# Patient Record
Sex: Male | Born: 1970 | Race: White | Hispanic: No | Marital: Married | Smoking: Former smoker
Health system: Southern US, Community
[De-identification: ages and names within clinical notes are randomized; demographics above are authoritative.]

## PROBLEM LIST (undated history)

## (undated) DIAGNOSIS — K449 Diaphragmatic hernia without obstruction or gangrene: Secondary | ICD-10-CM

## (undated) DIAGNOSIS — I493 Ventricular premature depolarization: Secondary | ICD-10-CM

## (undated) DIAGNOSIS — N200 Calculus of kidney: Secondary | ICD-10-CM

## (undated) DIAGNOSIS — B019 Varicella without complication: Secondary | ICD-10-CM

## (undated) DIAGNOSIS — S62109A Fracture of unspecified carpal bone, unspecified wrist, initial encounter for closed fracture: Secondary | ICD-10-CM

## (undated) DIAGNOSIS — K219 Gastro-esophageal reflux disease without esophagitis: Secondary | ICD-10-CM

## (undated) DIAGNOSIS — U071 COVID-19: Secondary | ICD-10-CM

## (undated) DIAGNOSIS — R001 Bradycardia, unspecified: Secondary | ICD-10-CM

## (undated) DIAGNOSIS — E785 Hyperlipidemia, unspecified: Secondary | ICD-10-CM

## (undated) DIAGNOSIS — I491 Atrial premature depolarization: Secondary | ICD-10-CM

## (undated) DIAGNOSIS — E559 Vitamin D deficiency, unspecified: Secondary | ICD-10-CM

## (undated) HISTORY — DX: Varicella without complication: B01.9

## (undated) HISTORY — DX: Calculus of kidney: N20.0

## (undated) HISTORY — PX: LASIK: SHX215

## (undated) HISTORY — DX: Atrial premature depolarization: I49.1

## (undated) HISTORY — DX: Bradycardia, unspecified: R00.1

## (undated) HISTORY — DX: COVID-19: U07.1

## (undated) HISTORY — DX: Diaphragmatic hernia without obstruction or gangrene: K44.9

## (undated) HISTORY — DX: Hyperlipidemia, unspecified: E78.5

## (undated) HISTORY — DX: Gastro-esophageal reflux disease without esophagitis: K21.9

## (undated) HISTORY — DX: Fracture of unspecified carpal bone, unspecified wrist, initial encounter for closed fracture: S62.109A

## (undated) HISTORY — DX: Ventricular premature depolarization: I49.3

## (undated) HISTORY — DX: Vitamin D deficiency, unspecified: E55.9

---
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
## 2011-10-07 ENCOUNTER — Emergency Department: Payer: Self-pay | Admitting: Emergency Medicine

## 2011-10-07 LAB — BASIC METABOLIC PANEL
Calcium, Total: 8.8 mg/dL (ref 8.5–10.1)
Co2: 25 mmol/L (ref 21–32)
Creatinine: 1.17 mg/dL (ref 0.60–1.30)
EGFR (African American): >60
Glucose: 124 mg/dL — ABNORMAL HIGH (ref 65–99)
Osmolality: 286 (ref 275–301)
Sodium: 143 mmol/L (ref 136–145)

## 2011-10-07 LAB — URINALYSIS, COMPLETE
Bacteria: NONE SEEN
Bilirubin,UR: NEGATIVE
Leukocyte Esterase: NEGATIVE
Ph: 5 (ref 4.5–8.0)
Protein: NEGATIVE
RBC,UR: 36 /HPF (ref 0–5)
Squamous Epithelial: <1

## 2011-10-07 LAB — CBC
HCT: 45.9 % (ref 40.0–52.0)
MCV: 89 fL (ref 80–100)
Platelet: 194 10*3/uL (ref 150–440)
RBC: 5.18 10*6/uL (ref 4.40–5.90)
WBC: 7.7 10*3/uL (ref 3.8–10.6)

## 2011-10-10 ENCOUNTER — Encounter: Payer: Self-pay | Admitting: Internal Medicine

## 2011-11-08 ENCOUNTER — Encounter: Payer: Self-pay | Admitting: Internal Medicine

## 2011-12-28 ENCOUNTER — Encounter: Payer: Self-pay | Admitting: Internal Medicine

## 2013-03-29 ENCOUNTER — Encounter: Payer: Self-pay | Admitting: Internal Medicine

## 2014-10-21 ENCOUNTER — Encounter: Payer: Self-pay | Admitting: Internal Medicine

## 2015-04-16 ENCOUNTER — Encounter: Payer: Self-pay | Admitting: Family Medicine

## 2015-04-16 ENCOUNTER — Ambulatory Visit (INDEPENDENT_AMBULATORY_CARE_PROVIDER_SITE_OTHER): Payer: BLUE CROSS/BLUE SHIELD | Admitting: Family Medicine

## 2015-04-16 VITALS — BP 116/80 | HR 62 | Wt 288.0 lb

## 2015-04-16 DIAGNOSIS — M9901 Segmental and somatic dysfunction of cervical region: Secondary | ICD-10-CM | POA: Diagnosis not present

## 2015-04-16 DIAGNOSIS — K219 Gastro-esophageal reflux disease without esophagitis: Secondary | ICD-10-CM | POA: Insufficient documentation

## 2015-04-16 DIAGNOSIS — M999 Biomechanical lesion, unspecified: Secondary | ICD-10-CM

## 2015-04-16 DIAGNOSIS — M542 Cervicalgia: Secondary | ICD-10-CM | POA: Diagnosis not present

## 2015-04-16 DIAGNOSIS — I10 Essential (primary) hypertension: Secondary | ICD-10-CM | POA: Insufficient documentation

## 2015-04-16 DIAGNOSIS — M9903 Segmental and somatic dysfunction of lumbar region: Secondary | ICD-10-CM | POA: Diagnosis not present

## 2015-04-16 DIAGNOSIS — M9902 Segmental and somatic dysfunction of thoracic region: Secondary | ICD-10-CM | POA: Diagnosis not present

## 2015-04-16 DIAGNOSIS — E669 Obesity, unspecified: Secondary | ICD-10-CM | POA: Insufficient documentation

## 2015-04-16 NOTE — Progress Notes (Signed)
[name] [place], [place] [address] Phone: [phone] Subjective:      CC: Neck pain  [id number] Roger [name] is a 45 y.o. male coming in with complaint of neck pain.  Patient's neck pain has been going on for years. Patient denies any type of injury. States that it is more of a stiffness. Patient states when he is looking down  seems to be worse. No pain at night. Patient denies any radiation down the arms or any numbness or tingling. Patient has not tried any significant home modalities and likes to avoid any type of medicines on a regular basis. Patient remains active. Not stopping him daily activities. Would rate the severity of pain is more of 5 out of 10. No associated headaches, no weakness of any of the extremities. Has seen a chiropractor previously that was minorly beneficial.    Past Medical History  Diagnosis Date  . Chicken pox    Past Surgical History  Procedure Laterality Date  . Tonsillectomy and adenoidectomy  1978   Social History   Social History  . Marital Status: Married    Spouse Name: N/A  . Number of Children: N/A  . Years of Education: N/A   Social History Main Topics  . Smoking status: Never Smoker   . Smokeless tobacco: Never Used  . Alcohol Use: No  . Drug Use: No  . Sexual Activity: Not on file   Other Topics Concern  . Not on file   Social History Narrative  . No narrative on file   Not on File Family History  Problem Relation Age of Onset  . Cancer Mother   . Kidney disease Mother   . Diabetes Mother   . Cancer Father   . Kidney disease Father   . Diabetes Father   . Cancer Maternal Grandmother   . Cancer Maternal Grandfather   . Cancer Paternal Grandmother   . Cancer Paternal Grandfather    No family history of rheumatological diseases  Past medical history, social, surgical and family history all reviewed in electronic medical record.  No pertanent information unless stated  regarding to the chief complaint.   Review of Systems: No headache, visual changes, nausea, vomiting, diarrhea, constipation, dizziness, abdominal pain, skin rash, fevers, chills, night sweats, weight loss, swollen lymph nodes, body aches, joint swelling, muscle aches, chest pain, shortness of breath, mood changes.   Objective Blood pressure 116/80, pulse 62, weight 288 lb (130.636 kg).  General: No apparent distress alert and oriented x3 mood and affect normal, dressed appropriately.  HEENT: Pupils equal, extraocular movements intact  Respiratory: Patient's speak in full sentences and does not appear short of breath  Cardiovascular: No lower extremity edema, non tender, no erythema  Skin: Warm dry intact with no signs of infection or rash on extremities or on axial skeleton.  Abdomen: Soft nontender  Neuro: Cranial nerves II through XII are intact, neurovascularly intact in all extremities with 2+ DTRs and 2+ pulses.  Lymph: No lymphadenopathy of posterior or anterior cervical chain or axillae bilaterally.  Gait normal with good balance and coordination.  MSK:  Non tender with full range of motion and good stability and symmetric strength and tone of shoulders, elbows, wrist, hip, knee and ankles bilaterally.  Neck: Inspection unremarkable. No palpable stepoffs. Negative Spurling's maneuver. lackss 5 of rotation to the right and less tender crease rotation to left. Grip strength and sensation normal in bilateral hands Strength good C4  to T1 distribution No sensory change to C4 to T1 Negative Hoffman sign bilaterally Reflexes normal  Osteopathic findings C2 flexed rotated and side bent left C4 flexed rotated and side bent right T3 extended rotated and side bent right T7 extended rotated and side bent left   Procedure note 97110; 15 minutes spent for Therapeutic exercises as stated in above notes.  This included exercises focusing on stretching, strengthening, with significant  focus on eccentric aspects.  Work with Product/process development scientist for scapular stabilization techniques as well as postural control techniques. Discussed ergonomics. Proper technique shown and discussed handout in great detail with ATC.  All questions were discussed and answered.      Impression and Recommendations:     This case required medical decision making of moderate complexity.      Note: This dictation was prepared with Dragon dictation along with smaller phrase technology. Any transcriptional errors that result from this process are unintentional.

## 2015-04-16 NOTE — Patient Instructions (Signed)
Good to see you  Ice 20 minutes 2 times daily. Usually after activity and before bed. Exercises 3 times a week.  Vitamin D 2000 IU daily  Turmeric 500mg  twice daily  On wall with heels, butt shoulder and head touching for a goal of 5 minutes daily  Keep monitor at eye level as much as possible See me again in 3-4 weeks.

## 2015-04-16 NOTE — Assessment & Plan Note (Signed)
Decision today to treat with OMT was based on Physical Exam  After verbal consent patient was treated with HVLA, ME, FPR techniques in Cervical, thoracic and lumbar areas  Patient tolerated the procedure well with improvement in symptoms  Patient given exercises, stretches and lifestyle modifications  See medications in patient instructions if given  Patient will follow up in 3-4 weeks

## 2015-04-16 NOTE — Assessment & Plan Note (Signed)
Patient does have posterior neck pain that seems to be musculoskeletal. No signs of any radiculopathy. Do not feel a imaging is necessary at this time. Did respond well to osteopathic manipulation. We discussed icing regimen, ergonomics. Patient work with Product/process development scientist. Given trial of topical anti-inflammatory. Home exercise given as well as we discussed over-the-counter medications. Patient will try to change ergonomics at work and patient will come back and see me again in 3-4 weeks.

## 2015-05-07 ENCOUNTER — Ambulatory Visit: Payer: BLUE CROSS/BLUE SHIELD | Admitting: Family Medicine

## 2015-05-13 ENCOUNTER — Encounter: Payer: Self-pay | Admitting: Family Medicine

## 2015-05-13 ENCOUNTER — Ambulatory Visit (INDEPENDENT_AMBULATORY_CARE_PROVIDER_SITE_OTHER): Payer: BLUE CROSS/BLUE SHIELD | Admitting: Family Medicine

## 2015-05-13 VITALS — BP 118/80 | HR 56 | Ht 79.0 in | Wt 291.0 lb

## 2015-05-13 DIAGNOSIS — M542 Cervicalgia: Secondary | ICD-10-CM

## 2015-05-13 DIAGNOSIS — M9902 Segmental and somatic dysfunction of thoracic region: Secondary | ICD-10-CM | POA: Diagnosis not present

## 2015-05-13 DIAGNOSIS — M9903 Segmental and somatic dysfunction of lumbar region: Secondary | ICD-10-CM

## 2015-05-13 DIAGNOSIS — M9901 Segmental and somatic dysfunction of cervical region: Secondary | ICD-10-CM | POA: Diagnosis not present

## 2015-05-13 DIAGNOSIS — M999 Biomechanical lesion, unspecified: Secondary | ICD-10-CM

## 2015-05-13 NOTE — Progress Notes (Signed)
[name] [place], [place] [address] Phone: [phone] Subjective:      CC: Neck pain follow-up  [id number] Roger [name] is a 45 y.o. male coming in with complaint of neck pain.  Found to have some poor posture as well as tightness in the neck. Likely some underlying arthritic changes. Overall though patient has been responding very well to conservative therapy. An states that he is 80% better. Wouldn't denies any significant pain. States it is more of a soreness. Patient states that that continues to be fairly constant. Not doing the exercises regularly but is taking the vitamins. Has noticed improvement overall. Trying to change some ergonomics or other day which has been difficult.    Past Medical History  Diagnosis Date  . Chicken pox    Past Surgical History  Procedure Laterality Date  . Tonsillectomy and adenoidectomy  1978   Social History   Social History  . Marital Status: Married    Spouse Name: N/A  . Number of Children: N/A  . Years of Education: N/A   Social History Main Topics  . Smoking status: Never Smoker   . Smokeless tobacco: Never Used  . Alcohol Use: No  . Drug Use: No  . Sexual Activity: Not Asked   Other Topics Concern  . None   Social History Narrative   Not on File Family History  Problem Relation Age of Onset  . Cancer Mother   . Kidney disease Mother   . Diabetes Mother   . Cancer Father   . Kidney disease Father   . Diabetes Father   . Cancer Maternal Grandmother   . Cancer Maternal Grandfather   . Cancer Paternal Grandmother   . Cancer Paternal Grandfather    No family history of rheumatological diseases  Past medical history, social, surgical and family history all reviewed in electronic medical record.  No pertanent information unless stated regarding to the chief complaint.   Review of Systems: No headache, visual changes, nausea, vomiting, diarrhea, constipation, dizziness,  abdominal pain, skin rash, fevers, chills, night sweats, weight loss, swollen lymph nodes, body aches, joint swelling, muscle aches, chest pain, shortness of breath, mood changes.   Objective Blood pressure 118/80, pulse 56, height 6\' 7"  (2.007 m), weight 291 lb (131.997 kg), SpO2 95 %.  General: No apparent distress alert and oriented x3 mood and affect normal, dressed appropriately.  HEENT: Pupils equal, extraocular movements intact  Respiratory: Patient's speak in full sentences and does not appear short of breath  Cardiovascular: No lower extremity edema, non tender, no erythema  Skin: Warm dry intact with no signs of infection or rash on extremities or on axial skeleton.  Abdomen: Soft nontender  Neuro: Cranial nerves II through XII are intact, neurovascularly intact in all extremities with 2+ DTRs and 2+ pulses.  Lymph: No lymphadenopathy of posterior or anterior cervical chain or axillae bilaterally.  Gait normal with good balance and coordination.  MSK:  Non tender with full range of motion and good stability and symmetric strength and tone of shoulders, elbows, wrist, hip, knee and ankles bilaterally.  Neck: Inspection unremarkable. No palpable stepoffs. Negative Spurling's maneuver. lacks 5 of rotation to the right \ Grip strength and sensation normal in bilateral hands Strength good C4 to T1 distribution No sensory change to C4 to T1 Negative Hoffman sign bilaterally Reflexes normal Minimal improvement  Osteopathic findings C2 flexed rotated and side bent left C4 flexed rotated and side bent  right T3 extended rotated and side bent right inhaled third rib T7 extended rotated and side bent left Mild change from previous pattern        Impression and Recommendations:     This case required medical decision making of moderate complexity.      Note: This dictation was prepared with Dragon dictation along with smaller phrase technology. Any transcriptional errors  that result from this process are unintentional.

## 2015-05-13 NOTE — Patient Instructions (Signed)
Great to see you  You are doing great  Continue to work on the posture and the exercises at least 3 times a week.  2 tennis ball in a tube sock and put it where neck meets head and lay on it when you feel tension  Continue the vitamins Lets see you again around 5-6 weeks.

## 2015-05-13 NOTE — Assessment & Plan Note (Signed)
Decision today to treat with OMT was based on Physical Exam  After verbal consent patient was treated with HVLA, ME, FPR techniques in Cervical, thoracic and lumbar areas  Patient tolerated the procedure well with improvement in symptoms  Patient given exercises, stretches and lifestyle modifications  See medications in patient instructions if given  Patient will follow up in 4-6 weeks

## 2015-05-13 NOTE — Progress Notes (Signed)
Pre visit review using our clinic review tool, if applicable. No additional management support is needed unless otherwise documented below in the visit note. 

## 2015-05-13 NOTE — Assessment & Plan Note (Signed)
Patient though is having subjective findings of improvement. Mild increase in range of motion which is an improvement as well. I will like to not change any type of management up significantly. We didn't stress again about posturing different ergonomic throughout the day it could be beneficial. We discussed self manipulation techniques that can be helpful as well. Patient will use ice if any breakthrough pain. Patient and will come back and see me again in 4-6 weeks for further evaluation and treatment.

## 2015-06-15 ENCOUNTER — Ambulatory Visit: Payer: Self-pay | Admitting: Podiatry

## 2015-06-18 ENCOUNTER — Encounter: Payer: Self-pay | Admitting: Family Medicine

## 2015-06-18 ENCOUNTER — Ambulatory Visit (INDEPENDENT_AMBULATORY_CARE_PROVIDER_SITE_OTHER): Payer: BLUE CROSS/BLUE SHIELD | Admitting: Family Medicine

## 2015-06-18 VITALS — BP 122/82 | HR 64 | Ht 79.0 in | Wt 292.0 lb

## 2015-06-18 DIAGNOSIS — M9901 Segmental and somatic dysfunction of cervical region: Secondary | ICD-10-CM

## 2015-06-18 DIAGNOSIS — M9902 Segmental and somatic dysfunction of thoracic region: Secondary | ICD-10-CM

## 2015-06-18 DIAGNOSIS — M542 Cervicalgia: Secondary | ICD-10-CM

## 2015-06-18 DIAGNOSIS — M9903 Segmental and somatic dysfunction of lumbar region: Secondary | ICD-10-CM | POA: Diagnosis not present

## 2015-06-18 DIAGNOSIS — M999 Biomechanical lesion, unspecified: Secondary | ICD-10-CM

## 2015-06-18 NOTE — Progress Notes (Signed)
[name] [place], [place] [address] Phone: [phone] Subjective:      CC: Neck pain follow-up  [id number] Roger [name] is a 45 y.o. male coming in with complaint of neck pain.  Found to have some poor posture as well as tightness in the neck. Patient has been doing great with conservative therapy. Feels and when he does he exercises more he feels better. Has been doing them very religiously. Patient states only some mild discomfort but never having any pain anymore. Very happy with results. Being more active in trying to lose weight.    Past Medical History  Diagnosis Date  . Chicken pox    Past Surgical History  Procedure Laterality Date  . Tonsillectomy and adenoidectomy  1978   Social History   Social History  . Marital Status: Married    Spouse Name: N/A  . Number of Children: N/A  . Years of Education: N/A   Social History Main Topics  . Smoking status: Never Smoker   . Smokeless tobacco: Never Used  . Alcohol Use: No  . Drug Use: No  . Sexual Activity: Not Asked   Other Topics Concern  . None   Social History Narrative   Not on File Family History  Problem Relation Age of Onset  . Cancer Mother   . Kidney disease Mother   . Diabetes Mother   . Cancer Father   . Kidney disease Father   . Diabetes Father   . Cancer Maternal Grandmother   . Cancer Maternal Grandfather   . Cancer Paternal Grandmother   . Cancer Paternal Grandfather    No family history of rheumatological diseases  Past medical history, social, surgical and family history all reviewed in electronic medical record.  No pertanent information unless stated regarding to the chief complaint.   Review of Systems: No headache, visual changes, nausea, vomiting, diarrhea, constipation, dizziness, abdominal pain, skin rash, fevers, chills, night sweats, weight loss, swollen lymph nodes, body aches, joint swelling, muscle aches, chest pain, shortness  of breath, mood changes.   Objective Blood pressure 122/82, pulse 64, height 6\' 7"  (2.007 m), weight 292 lb (132.45 kg), SpO2 95 %.  General: No apparent distress alert and oriented x3 mood and affect normal, dressed appropriately.  HEENT: Pupils equal, extraocular movements intact  Respiratory: Patient's speak in full sentences and does not appear short of breath  Cardiovascular: No lower extremity edema, non tender, no erythema  Skin: Warm dry intact with no signs of infection or rash on extremities or on axial skeleton.  Abdomen: Soft nontender  Neuro: Cranial nerves II through XII are intact, neurovascularly intact in all extremities with 2+ DTRs and 2+ pulses.  Lymph: No lymphadenopathy of posterior or anterior cervical chain or axillae bilaterally.  Gait normal with good balance and coordination.  MSK:  Non tender with full range of motion and good stability and symmetric strength and tone of shoulders, elbows, wrist, hip, knee and ankles bilaterally.  Neck: Inspection unremarkable. No palpable stepoffs. Negative Spurling's maneuver. Minimal decreased range of motion of the right side Grip strength and sensation normal in bilateral hands Strength good C4 to T1 distribution No sensory change to C4 to T1 Negative Hoffman sign bilaterally Reflexes normal Minimal improvement  Osteopathic findings C2 flexed rotated and side bent left C4 flexed rotated and side bent right T3 extended rotated and side bent right inhaled third rib T7 extended rotated and side bent left  Impression and Recommendations:     This case required medical decision making of moderate complexity.      Note: This dictation was prepared with Dragon dictation along with smaller phrase technology. Any transcriptional errors that result from this process are unintentional.

## 2015-06-18 NOTE — Assessment & Plan Note (Signed)
Decision today to treat with OMT was based on Physical Exam  After verbal consent patient was treated with HVLA, ME, FPR techniques in Cervical, thoracic and lumbar areas  Patient tolerated the procedure well with improvement in symptoms  Patient given exercises, stretches and lifestyle modifications  See medications in patient instructions if given  Patient will follow up in 8 weeks

## 2015-06-18 NOTE — Patient Instructions (Signed)
Good to see you  Ice 20 minutes 2 times daily. Usually after activity and before bed. Stay active  [employer] orthotics "total support" online would be great  Dont change a thing See me again in 2 months.

## 2015-06-18 NOTE — Progress Notes (Signed)
Pre visit review using our clinic review tool, if applicable. No additional management support is needed unless otherwise documented below in the visit note. 

## 2015-06-18 NOTE — Assessment & Plan Note (Signed)
Doing remarkably well at this time. Encourage patient to continue to work on the posture in the upper back musculature. At this point we can space amounted 2 month intervals.

## 2015-06-25 DIAGNOSIS — G5762 Lesion of plantar nerve, left lower limb: Secondary | ICD-10-CM | POA: Diagnosis not present

## 2015-06-25 DIAGNOSIS — M7751 Other enthesopathy of right foot: Secondary | ICD-10-CM | POA: Diagnosis not present

## 2015-06-25 DIAGNOSIS — G5761 Lesion of plantar nerve, right lower limb: Secondary | ICD-10-CM | POA: Diagnosis not present

## 2015-06-25 DIAGNOSIS — M722 Plantar fascial fibromatosis: Secondary | ICD-10-CM | POA: Diagnosis not present

## 2015-06-25 DIAGNOSIS — M7752 Other enthesopathy of left foot: Secondary | ICD-10-CM | POA: Diagnosis not present

## 2015-08-18 ENCOUNTER — Ambulatory Visit: Payer: BLUE CROSS/BLUE SHIELD | Admitting: Family Medicine

## 2015-08-25 NOTE — Progress Notes (Signed)
[name] [address] [place], [place] [address] Phone: [phone] Subjective:      CC: Neck pain follow-up  [id number]  Roger [name] is a 45 y.o. male coming in with complaint of neck pain.  Found to have some poor posture as well as tightness in the neck. Patient has been doing great with conservative therapy.  Has been 10 weeks since we seen patient. Patient states Overall he seems to be doing relatively well. Last 2 weeks to have increasing tightness again. Patient denies any numbness or tingling. Patient rates the severity of pain as 4 out of 10. States that he has noticed improvement since he's been wearing the orthotics on a more regular basis.    Past Medical History:  Diagnosis Date  . Chicken pox    Past Surgical History:  Procedure Laterality Date  . TONSILLECTOMY AND ADENOIDECTOMY  1978   Social History   Social History  . Marital status: Married    Spouse name: N/A  . Number of children: N/A  . Years of education: N/A   Social History Main Topics  . Smoking status: Never Smoker  . Smokeless tobacco: Never Used  . Alcohol use No  . Drug use: No  . Sexual activity: Not on file   Other Topics Concern  . Not on file   Social History Narrative  . No narrative on file   Not on File Family History  Problem Relation Age of Onset  . Cancer Mother   . Kidney disease Mother   . Diabetes Mother   . Cancer Father   . Kidney disease Father   . Diabetes Father   . Cancer Maternal Grandmother   . Cancer Maternal Grandfather   . Cancer Paternal Grandmother   . Cancer Paternal Grandfather    No family history of rheumatological diseases  Past medical history, social, surgical and family history all reviewed in electronic medical record.  No pertanent information unless stated regarding to the chief complaint.   Review of Systems: No headache, visual changes, nausea, vomiting, diarrhea, constipation, dizziness, abdominal  pain, skin rash, fevers, chills, night sweats, weight loss, swollen lymph nodes, body aches, joint swelling, muscle aches, chest pain, shortness of breath, mood changes.   Objective  There were no vitals taken for this visit.  General: No apparent distress alert and oriented x3 mood and affect normal, dressed appropriately.  HEENT: Pupils equal, extraocular movements intact  Respiratory: Patient's speak in full sentences and does not appear short of breath  Cardiovascular: No lower extremity edema, non tender, no erythema  Skin: Warm dry intact with no signs of infection or rash on extremities or on axial skeleton.  Abdomen: Soft nontender  Neuro: Cranial nerves II through XII are intact, neurovascularly intact in all extremities with 2+ DTRs and 2+ pulses.  Lymph: No lymphadenopathy of posterior or anterior cervical chain or axillae bilaterally.  Gait normal with good balance and coordination.  MSK:  Non tender with full range of motion and good stability and symmetric strength and tone of shoulders, elbows, wrist, hip, knee and ankles bilaterally.  Neck: Inspection unremarkable. No palpable stepoffs. Negative Spurling's maneuver. Continued tightness on the right side of the neck especially in rotation and side bending Grip strength and sensation normal in bilateral hands Strength good C4 to T1 distribution No sensory change to C4 to T1 Negative Hoffman sign bilaterally Reflexes normal Minimal improvement  Osteopathic findings C2 flexed rotated and side bent left  C4 flexed rotated and side bent right T3 extended rotated and side bent right inhaled third rib T8 extended rotated and side bent left         Impression and Recommendations:     This case required medical decision making of moderate complexity.      Note: This dictation was prepared with Dragon dictation along with smaller phrase technology. Any transcriptional errors that result from this process are  unintentional.

## 2015-08-26 ENCOUNTER — Ambulatory Visit (INDEPENDENT_AMBULATORY_CARE_PROVIDER_SITE_OTHER): Payer: BLUE CROSS/BLUE SHIELD | Admitting: Family Medicine

## 2015-08-26 ENCOUNTER — Encounter: Payer: Self-pay | Admitting: Family Medicine

## 2015-08-26 VITALS — BP 120/80 | HR 61 | Ht 79.0 in | Wt 298.0 lb

## 2015-08-26 DIAGNOSIS — M9903 Segmental and somatic dysfunction of lumbar region: Secondary | ICD-10-CM

## 2015-08-26 DIAGNOSIS — M999 Biomechanical lesion, unspecified: Secondary | ICD-10-CM

## 2015-08-26 DIAGNOSIS — M9902 Segmental and somatic dysfunction of thoracic region: Secondary | ICD-10-CM

## 2015-08-26 DIAGNOSIS — M542 Cervicalgia: Secondary | ICD-10-CM | POA: Diagnosis not present

## 2015-08-26 DIAGNOSIS — M9901 Segmental and somatic dysfunction of cervical region: Secondary | ICD-10-CM | POA: Diagnosis not present

## 2015-08-26 NOTE — Assessment & Plan Note (Signed)
Decision today to treat with OMT was based on Physical Exam  After verbal consent patient was treated with HVLA, ME, FPR techniques in Cervical, thoracic and lumbar areas  Patient tolerated the procedure well with improvement in symptoms  Patient given exercises, stretches and lifestyle modifications  See medications in patient instructions if given  Patient will follow up in 8-10 weeks

## 2015-08-26 NOTE — Patient Instructions (Signed)
Good to see you  Ice when you need it I would do turmeric 500mg  twice daily or the vimovo, not both if you can help it.  Keep working on the posture See me again in 8-10 weeks

## 2015-08-26 NOTE — Assessment & Plan Note (Signed)
Patient is working on the posture significant amount. Doing very well. We discussed icing regimen. Discussed home exercise. Patient is doing very well and we will have patient come back every 2-3 months.

## 2015-10-06 DIAGNOSIS — E785 Hyperlipidemia, unspecified: Secondary | ICD-10-CM | POA: Diagnosis not present

## 2015-10-07 DIAGNOSIS — K219 Gastro-esophageal reflux disease without esophagitis: Secondary | ICD-10-CM | POA: Diagnosis not present

## 2015-10-22 ENCOUNTER — Ambulatory Visit: Payer: BLUE CROSS/BLUE SHIELD | Admitting: Family Medicine

## 2015-10-27 NOTE — Assessment & Plan Note (Signed)
Decision today to treat with OMT was based on Physical Exam  After verbal consent patient was treated with HVLA, ME, FPR techniques in Cervical, thoracic and lumbar areas  Patient tolerated the procedure well with improvement in symptoms  Patient given exercises, stretches and lifestyle modifications  See medications in patient instructions if given  Patient will follow up in 8-12 weeks

## 2015-10-27 NOTE — Progress Notes (Signed)
[name] [place], [place] [address] Phone: [phone] Subjective:      CC: Neck pain follow-up  [id number]  Roger [name] is a 45 y.o. male coming in with complaint of neck pain.  Found to have some poor posture as well as tightness in the neck. Patient has been doing great with conservative therapy.  Has been 10 weeks since we seen patient. Patient states Overall he seems to be doing relatively well. Last 2 weeks to have increasing tightness again. Patient denies any numbness or tingling. Feels that that was a good timeframe but seems to be little bit longer. Having more increasing tightness especially at night.    Past Medical History:  Diagnosis Date  . Chicken pox    Past Surgical History:  Procedure Laterality Date  . TONSILLECTOMY AND ADENOIDECTOMY  1978   Social History   Social History  . Marital status: Married    Spouse name: N/A  . Number of children: N/A  . Years of education: N/A   Social History Main Topics  . Smoking status: Never Smoker  . Smokeless tobacco: Never Used  . Alcohol use No  . Drug use: No  . Sexual activity: Not on file   Other Topics Concern  . Not on file   Social History Narrative  . No narrative on file   Not on File Family History  Problem Relation Age of Onset  . Cancer Mother   . Kidney disease Mother   . Diabetes Mother   . Cancer Father   . Kidney disease Father   . Diabetes Father   . Cancer Maternal Grandmother   . Cancer Maternal Grandfather   . Cancer Paternal Grandmother   . Cancer Paternal Grandfather    No family history of rheumatological diseases  Past medical history, social, surgical and family history all reviewed in electronic medical record.  No pertanent information unless stated regarding to the chief complaint.   Review of Systems: No headache, visual changes, nausea, vomiting, diarrhea, constipation, dizziness, abdominal pain, skin rash, fevers,  chills, night sweats, weight loss, swollen lymph nodes, chest pain, shortness of breath, mood changes.   Objective  Blood pressure (!) 104/58, pulse (!) 54, weight 293 lb 12.8 oz (133.3 kg).  General: No apparent distress alert and oriented x3 mood and affect normal, dressed appropriately.  HEENT: Pupils equal, extraocular movements intact  Respiratory: Patient's speak in full sentences and does not appear short of breath  Cardiovascular: No lower extremity edema, non tender, no erythema  Skin: Warm dry intact with no signs of infection or rash on extremities or on axial skeleton.  Abdomen: Soft nontender  Neuro: Cranial nerves II through XII are intact, neurovascularly intact in all extremities with 2+ DTRs and 2+ pulses.  Lymph: No lymphadenopathy of posterior or anterior cervical chain or axillae bilaterally.  Gait normal with good balance and coordination.  MSK:  Non tender with full range of motion and good stability and symmetric strength and tone of shoulders, elbows, wrist, hip, knee and ankles bilaterally.  Neck: Inspection unremarkable. No palpable stepoffs. Negative Spurling's maneuver. Continued tightness on the right side Grip strength and sensation normal in bilateral hands Strength good C4 to T1 distribution No sensory change to C4 to T1 Negative Hoffman sign bilaterally Reflexes normal Minor increasing tightness from previous exam  Osteopathic findings C2 flexed rotated and side bent left C6 flexed rotated and side bent right T5 extended rotated and side  bent right inhaled third rib T9 extended rotated and side bent left         Impression and Recommendations:     This case required medical decision making of moderate complexity.      Note: This dictation was prepared with Dragon dictation along with smaller phrase technology. Any transcriptional errors that result from this process are unintentional.

## 2015-10-27 NOTE — Assessment & Plan Note (Signed)
Abdomen Very well. Encourage patient to continue to work on upper back strengthening and core stabilization. We discussed about scapular retraction. Discussed ergonomics again. Patient is doing very well and we'll see me again every 2-3 months.

## 2015-10-28 ENCOUNTER — Ambulatory Visit (INDEPENDENT_AMBULATORY_CARE_PROVIDER_SITE_OTHER): Payer: BLUE CROSS/BLUE SHIELD | Admitting: Family Medicine

## 2015-10-28 VITALS — BP 104/58 | HR 54 | Wt 293.8 lb

## 2015-10-28 DIAGNOSIS — M999 Biomechanical lesion, unspecified: Secondary | ICD-10-CM | POA: Diagnosis not present

## 2015-10-28 DIAGNOSIS — M542 Cervicalgia: Secondary | ICD-10-CM | POA: Diagnosis not present

## 2015-10-28 NOTE — Patient Instructions (Signed)
Great to see you  Ice 20 minutes 2 times daily. Usually after activity and before bed. Sleep with arm tucked in with sleeping.  No direct air Keep working on upper back strength  See me again in 8 weeks.

## 2015-12-19 NOTE — Progress Notes (Deleted)
[name] [place], [place] [address] Phone: [phone] Subjective:      CC: Neck pain follow-up  [id number]  Roger [name] is a 45 y.o. male coming in with complaint of neck pain.  Found to have some poor posture as well as tightness in the neck. Patient has been doing great with conservative therapy.  Has been 10 weeks since we seen patient. Patient states Overall he seems to be doing relatively well. Last 2 weeks to have increasing tightness again. Patient denies any numbness or tingling. Feels that that was a good timeframe but seems to be little bit longer. Having more increasing tightness especially at night.    Past Medical History:  Diagnosis Date  . Chicken pox    Past Surgical History:  Procedure Laterality Date  . TONSILLECTOMY AND ADENOIDECTOMY  1978   Social History   Social History  . Marital status: Married    Spouse name: N/A  . Number of children: N/A  . Years of education: N/A   Social History Main Topics  . Smoking status: Never Smoker  . Smokeless tobacco: Never Used  . Alcohol use No  . Drug use: No  . Sexual activity: Not on file   Other Topics Concern  . Not on file   Social History Narrative  . No narrative on file   Not on File Family History  Problem Relation Age of Onset  . Cancer Mother   . Kidney disease Mother   . Diabetes Mother   . Cancer Father   . Kidney disease Father   . Diabetes Father   . Cancer Maternal Grandmother   . Cancer Maternal Grandfather   . Cancer Paternal Grandmother   . Cancer Paternal Grandfather    No family history of rheumatological diseases  Past medical history, social, surgical and family history all reviewed in electronic medical record.  No pertanent information unless stated regarding to the chief complaint.   Review of Systems: No headache, visual changes, nausea, vomiting, diarrhea, constipation, dizziness, abdominal pain, skin rash, fevers,  chills, night sweats, weight loss, swollen lymph nodes, chest pain, shortness of breath, mood changes.   Objective  There were no vitals taken for this visit.  Systems examined below as of 12/19/15 General: NAD A&O x3 mood, affect normal  HEENT: Pupils equal, extraocular movements intact no nystagmus Respiratory: not short of breath at rest or with speaking Cardiovascular: No lower extremity edema, non tender Skin: Warm dry intact with no signs of infection or rash on extremities or on axial skeleton. Abdomen: Soft nontender, no masses Neuro: Cranial nerves  intact, neurovascularly intact in all extremities with 2+ DTRs and 2+ pulses. Lymph: No lymphadenopathy appreciated today  Gait normal with good balance and coordination.  MSK: Non tender with full range of motion and good stability and symmetric strength and tone of shoulders, elbows, wrist,  knee hips and ankles bilaterally.   Neck: Inspection unremarkable. No palpable stepoffs. Negative Spurling's maneuver. Continued tightness on the right side Grip strength and sensation normal in bilateral hands Strength good C4 to T1 distribution No sensory change to C4 to T1 Negative Hoffman sign bilaterally Reflexes normal Minor increasing tightness from previous exam  Osteopathic findings C2 flexed rotated and side bent left C6 flexed rotated and side bent right T5 extended rotated and side bent right inhaled third rib T9 extended rotated and side bent left         Impression and Recommendations:  This case required medical decision making of moderate complexity.      Note: This dictation was prepared with Dragon dictation along with smaller phrase technology. Any transcriptional errors that result from this process are unintentional.

## 2015-12-22 ENCOUNTER — Ambulatory Visit: Payer: BLUE CROSS/BLUE SHIELD | Admitting: Family Medicine

## 2015-12-28 NOTE — Progress Notes (Deleted)
[name] [place],  [address] Phone: [phone] Subjective:      CC: Neck pain follow-up  [id number]  Roger [name] is a 45 y.o. male coming in with complaint of neck pain.  Found to have some poor posture as well as tightness in the neck. Patient has been doing great with conservative therapy.  Has been 10 weeks since we seen patient. Patient states Overall he seems to be doing relatively well. Last 2 weeks to have increasing tightness again. Patient denies any numbness or tingling. Feels that that was a good timeframe but seems to be little bit longer. Having more increasing tightness especially at night.    Past Medical History:  Diagnosis Date  . Chicken pox    Past Surgical History:  Procedure Laterality Date  . TONSILLECTOMY AND ADENOIDECTOMY  1978   Social History   Social History  . Marital status: Married    Spouse name: N/A  . Number of children: N/A  . Years of education: N/A   Social History Main Topics  . Smoking status: Never Smoker  . Smokeless tobacco: Never Used  . Alcohol use No  . Drug use: No  . Sexual activity: Not on file   Other Topics Concern  . Not on file   Social History Narrative  . No narrative on file   Not on File Family History  Problem Relation Age of Onset  . Cancer Mother   . Kidney disease Mother   . Diabetes Mother   . Cancer Father   . Kidney disease Father   . Diabetes Father   . Cancer Maternal Grandmother   . Cancer Maternal Grandfather   . Cancer Paternal Grandmother   . Cancer Paternal Grandfather    No family history of rheumatological diseases  Past medical history, social, surgical and family history all reviewed in electronic medical record.  No pertanent information unless stated regarding to the chief complaint.   Review of Systems: No headache, visual changes, nausea, vomiting, diarrhea, constipation, dizziness, abdominal pain, skin rash, fevers,  chills, night sweats, weight loss, swollen lymph nodes, chest pain, shortness of breath, mood changes.   Objective  There were no vitals taken for this visit.  Systems examined below as of 12/28/15 General: NAD A&O x3 mood, affect normal  HEENT: Pupils equal, extraocular movements intact no nystagmus Respiratory: not short of breath at rest or with speaking Cardiovascular: No lower extremity edema, non tender Skin: Warm dry intact with no signs of infection or rash on extremities or on axial skeleton. Abdomen: Soft nontender, no masses Neuro: Cranial nerves  intact, neurovascularly intact in all extremities with 2+ DTRs and 2+ pulses. Lymph: No lymphadenopathy appreciated today  Gait normal with good balance and coordination.  MSK: Non tender with full range of motion and good stability and symmetric strength and tone of shoulders, elbows, wrist,  knee hips and ankles bilaterally.   Neck: Inspection unremarkable. No palpable stepoffs. Negative Spurling's maneuver. Continued tightness on the right side Grip strength and sensation normal in bilateral hands Strength good C4 to T1 distribution No sensory change to C4 to T1 Negative Hoffman sign bilaterally Reflexes normal Minor increasing tightness from previous exam  Osteopathic findings C2 flexed rotated and side bent left C6 flexed rotated and side bent right T5 extended rotated and side bent right inhaled third rib T9 extended rotated and side bent left         Impression and Recommendations:  This case required medical decision making of moderate complexity.      Note: This dictation was prepared with Dragon dictation along with smaller phrase technology. Any transcriptional errors that result from this process are unintentional.

## 2015-12-29 ENCOUNTER — Ambulatory Visit: Payer: BLUE CROSS/BLUE SHIELD | Admitting: Family Medicine

## 2016-02-09 DIAGNOSIS — H52223 Regular astigmatism, bilateral: Secondary | ICD-10-CM | POA: Diagnosis not present

## 2016-02-15 DIAGNOSIS — I340 Nonrheumatic mitral (valve) insufficiency: Secondary | ICD-10-CM | POA: Diagnosis not present

## 2016-02-15 DIAGNOSIS — E785 Hyperlipidemia, unspecified: Secondary | ICD-10-CM | POA: Diagnosis not present

## 2016-02-17 DIAGNOSIS — E785 Hyperlipidemia, unspecified: Secondary | ICD-10-CM | POA: Diagnosis not present

## 2016-02-17 DIAGNOSIS — J301 Allergic rhinitis due to pollen: Secondary | ICD-10-CM | POA: Diagnosis not present

## 2016-02-17 DIAGNOSIS — K219 Gastro-esophageal reflux disease without esophagitis: Secondary | ICD-10-CM | POA: Diagnosis not present

## 2016-05-16 DIAGNOSIS — E785 Hyperlipidemia, unspecified: Secondary | ICD-10-CM | POA: Diagnosis not present

## 2016-05-16 DIAGNOSIS — I10 Essential (primary) hypertension: Secondary | ICD-10-CM | POA: Diagnosis not present

## 2016-05-17 DIAGNOSIS — K219 Gastro-esophageal reflux disease without esophagitis: Secondary | ICD-10-CM | POA: Diagnosis not present

## 2016-05-17 DIAGNOSIS — E785 Hyperlipidemia, unspecified: Secondary | ICD-10-CM | POA: Diagnosis not present

## 2016-06-05 DIAGNOSIS — S91332A Puncture wound without foreign body, left foot, initial encounter: Secondary | ICD-10-CM | POA: Diagnosis not present

## 2016-06-05 DIAGNOSIS — Z23 Encounter for immunization: Secondary | ICD-10-CM | POA: Diagnosis not present

## 2016-09-30 DIAGNOSIS — E785 Hyperlipidemia, unspecified: Secondary | ICD-10-CM | POA: Diagnosis not present

## 2016-10-04 DIAGNOSIS — E785 Hyperlipidemia, unspecified: Secondary | ICD-10-CM | POA: Diagnosis not present

## 2016-10-04 DIAGNOSIS — K219 Gastro-esophageal reflux disease without esophagitis: Secondary | ICD-10-CM | POA: Diagnosis not present

## 2016-10-04 DIAGNOSIS — J301 Allergic rhinitis due to pollen: Secondary | ICD-10-CM | POA: Diagnosis not present

## 2017-02-15 ENCOUNTER — Encounter: Payer: Self-pay | Admitting: Internal Medicine

## 2017-03-09 ENCOUNTER — Encounter: Payer: Self-pay | Admitting: Internal Medicine

## 2017-03-09 ENCOUNTER — Ambulatory Visit: Payer: BLUE CROSS/BLUE SHIELD | Admitting: Internal Medicine

## 2017-03-09 VITALS — BP 112/78 | HR 48 | Temp 98.0°F | Ht 78.0 in | Wt 300.0 lb

## 2017-03-09 DIAGNOSIS — Z1329 Encounter for screening for other suspected endocrine disorder: Secondary | ICD-10-CM

## 2017-03-09 DIAGNOSIS — I491 Atrial premature depolarization: Secondary | ICD-10-CM | POA: Diagnosis not present

## 2017-03-09 DIAGNOSIS — Z113 Encounter for screening for infections with a predominantly sexual mode of transmission: Secondary | ICD-10-CM

## 2017-03-09 DIAGNOSIS — Z1159 Encounter for screening for other viral diseases: Secondary | ICD-10-CM | POA: Diagnosis not present

## 2017-03-09 DIAGNOSIS — I493 Ventricular premature depolarization: Secondary | ICD-10-CM | POA: Diagnosis not present

## 2017-03-09 DIAGNOSIS — R319 Hematuria, unspecified: Secondary | ICD-10-CM

## 2017-03-09 DIAGNOSIS — E669 Obesity, unspecified: Secondary | ICD-10-CM

## 2017-03-09 DIAGNOSIS — R002 Palpitations: Secondary | ICD-10-CM | POA: Diagnosis not present

## 2017-03-09 DIAGNOSIS — Z125 Encounter for screening for malignant neoplasm of prostate: Secondary | ICD-10-CM

## 2017-03-09 DIAGNOSIS — K219 Gastro-esophageal reflux disease without esophagitis: Secondary | ICD-10-CM

## 2017-03-09 DIAGNOSIS — H547 Unspecified visual loss: Secondary | ICD-10-CM | POA: Diagnosis not present

## 2017-03-09 DIAGNOSIS — R739 Hyperglycemia, unspecified: Secondary | ICD-10-CM

## 2017-03-09 DIAGNOSIS — E785 Hyperlipidemia, unspecified: Secondary | ICD-10-CM | POA: Insufficient documentation

## 2017-03-09 DIAGNOSIS — R001 Bradycardia, unspecified: Secondary | ICD-10-CM

## 2017-03-09 DIAGNOSIS — Z0000 Encounter for general adult medical examination without abnormal findings: Secondary | ICD-10-CM

## 2017-03-09 DIAGNOSIS — Z8042 Family history of malignant neoplasm of prostate: Secondary | ICD-10-CM

## 2017-03-09 MED ORDER — PANTOPRAZOLE SODIUM 20 MG PO TBEC: 20.0000 mg | DELAYED_RELEASE_TABLET | ORAL | 3 refills | Status: DC

## 2017-03-09 NOTE — Patient Instructions (Addendum)
We will refer you for second opinion to cardiology Dr. Nehemiah Massed  Please sch appt with [place] Schedule fasting labs w/in the next week F/u with you in 2-3 months    Gastroesophageal Reflux Disease, Adult Normally, food travels down the esophagus and stays in the stomach to be digested. However, when a person has gastroesophageal reflux disease (GERD), food and stomach acid move back up into the esophagus. When this happens, the esophagus becomes sore and inflamed. Over time, GERD can create small holes (ulcers) in the lining of the esophagus. What are the causes? This condition is caused by a problem with the muscle between the esophagus and the stomach (lower esophageal sphincter, or LES). Normally, the LES muscle closes after food passes through the esophagus to the stomach. When the LES is weakened or abnormal, it does not close properly, and that allows food and stomach acid to go back up into the esophagus. The LES can be weakened by certain dietary substances, medicines, and medical conditions, including:  Tobacco use.  Pregnancy.  Having a hiatal hernia.  Heavy alcohol use.  Certain foods and beverages, such as coffee, chocolate, onions, and peppermint.  What increases the risk? This condition is more likely to develop in:  People who have an increased body weight.  People who have connective tissue disorders.  People who use NSAID medicines.  What are the signs or symptoms? Symptoms of this condition include:  Heartburn.  Difficult or painful swallowing.  The feeling of having a lump in the throat.  Abitter taste in the mouth.  Bad breath.  Having a large amount of saliva.  Having an upset or bloated stomach.  Belching.  Chest pain.  Shortness of breath or wheezing.  Ongoing (chronic) cough or a night-time cough.  Wearing away of tooth enamel.  Weight loss.  Different conditions can cause chest pain. Make sure to see your health care provider  if you experience chest pain. How is this diagnosed? Your health care provider will take a medical history and perform a physical exam. To determine if you have mild or severe GERD, your health care provider may also monitor how you respond to treatment. You may also have other tests, including:  An endoscopy toexamine your stomach and esophagus with a small camera.  A test thatmeasures the acidity level in your esophagus.  A test thatmeasures how much pressure is on your esophagus.  A barium swallow or modified barium swallow to show the shape, size, and functioning of your esophagus.  How is this treated? The goal of treatment is to help relieve your symptoms and to prevent complications. Treatment for this condition may vary depending on how severe your symptoms are. Your health care provider may recommend:  Changes to your diet.  Medicine.  Surgery.  Follow these instructions at home: Diet  Follow a diet as recommended by your health care provider. This may involve avoiding foods and drinks such as: ? Coffee and tea (with or without caffeine). ? Drinks that containalcohol. ? Energy drinks and sports drinks. ? Carbonated drinks or sodas. ? Chocolate and cocoa. ? Peppermint and mint flavorings. ? Garlic and onions. ? Horseradish. ? Spicy and acidic foods, including peppers, chili powder, curry powder, vinegar, hot sauces, and barbecue sauce. ? Citrus fruit juices and citrus fruits, such as oranges, lemons, and limes. ? Tomato-based foods, such as red sauce, chili, salsa, and pizza with red sauce. ? Fried and fatty foods, such as donuts, french fries, potato chips, and  high-fat dressings. ? High-fat meats, such as hot dogs and fatty cuts of red and white meats, such as rib eye steak, sausage, ham, and bacon. ? High-fat dairy items, such as whole milk, butter, and cream cheese.  Eat small, frequent meals instead of large meals.  Avoid drinking large amounts of liquid  with your meals.  Avoid eating meals during the 2-3 hours before bedtime.  Avoid lying down right after you eat.  Do not exercise right after you eat. General instructions  Pay attention to any changes in your symptoms.  Take over-the-counter and prescription medicines only as told by your health care provider. Do not take aspirin, ibuprofen, or other NSAIDs unless your health care provider told you to do so.  Do not use any tobacco products, including cigarettes, chewing tobacco, and e-cigarettes. If you need help quitting, ask your health care provider.  Wear loose-fitting clothing. Do not wear anything tight around your waist that causes pressure on your abdomen.  Raise (elevate) the head of your bed 6 inches (15cm).  Try to reduce your stress, such as with yoga or meditation. If you need help reducing stress, ask your health care provider.  If you are overweight, reduce your weight to an amount that is healthy for you. Ask your health care provider for guidance about a safe weight loss goal.  Keep all follow-up visits as told by your health care provider. This is important. Contact a health care provider if:  You have new symptoms.  You have unexplained weight loss.  You have difficulty swallowing, or it hurts to swallow.  You have wheezing or a persistent cough.  Your symptoms do not improve with treatment.  You have a hoarse voice. Get help right away if:  You have pain in your arms, neck, jaw, teeth, or back.  You feel sweaty, dizzy, or light-headed.  You have chest pain or shortness of breath.  You vomit and your vomit looks like blood or coffee grounds.  You faint.  Your stool is bloody or black.  You cannot swallow, drink, or eat. This information is not intended to replace advice given to you by your health care provider. Make sure you discuss any questions you have with your health care provider. Document Released: 10/20/2004 Document Revised:  06/10/2015 Document Reviewed: 05/07/2014 Elsevier Interactive Patient Education  [employer].

## 2017-03-09 NOTE — Progress Notes (Signed)
Pre visit review using our clinic review tool, if applicable. No additional management support is needed unless otherwise documented below in the visit note. 

## 2017-03-12 ENCOUNTER — Encounter: Payer: Self-pay | Admitting: Internal Medicine

## 2017-03-12 NOTE — Progress Notes (Signed)
Chief Complaint  Patient presents with  . New Patient (Initial Visit)   NP Establish care with me Dr. Gayland Curry his fathers PCP since 2016/2017 he was seeing Dr. Elijio Miles up until now  1. H/o PACs, PVCS, palpitations on Metoprolol 25 mg XL qd per chart review [facility] records since 2013. HR is low today 48 w/o sx's.  2. Reports problems with vision had Lasik 15 years ago advised pt to f/u with [facility] in the future  3. GERD on protonix 40 mg qam he wants to try lower dose will reduced to 20 mg qd.    Review of Systems  Constitutional: Negative for weight loss.  HENT: Negative for hearing loss.   Eyes:       +trouble with vision   Respiratory: Negative for shortness of breath.   Cardiovascular: Negative for chest pain.  Gastrointestinal: Positive for heartburn. Negative for abdominal pain and blood in stool.  Musculoskeletal: Negative for falls and joint pain.  Skin: Negative for rash.  Neurological: Negative for dizziness and headaches.  Psychiatric/Behavioral: Negative for depression and memory loss.   Past Medical History:  Diagnosis Date  . Chicken pox   . GERD (gastroesophageal reflux disease)   . Hiatal hernia    small noted 2013   . Kidney stone   . Premature atrial contractions   . Premature ventricular contraction    Past Surgical History:  Procedure Laterality Date  . LASIK     2004   . TONSILLECTOMY AND ADENOIDECTOMY  1978   Family History  Problem Relation Age of Onset  . Cancer Mother        kidney cancer   . Kidney disease Mother   . Diabetes Mother   . Hearing loss Mother   . Heart disease Mother   . Cancer Father        prostate, colon   . Kidney disease Father   . Diabetes Father   . Hearing loss Father   . Stroke Maternal Grandmother   . Cancer Maternal Grandfather        bladder cancer, +smoker   . Alcohol abuse Maternal Grandfather    Social History   Socioeconomic History  . Marital status: Married    Spouse name: Not on file  . Number  of children: Not on file  . Years of education: Not on file  . Highest education level: Not on file  Social Needs  . Financial resource strain: Not on file  . Food insecurity - worry: Not on file  . Food insecurity - inability: Not on file  . Transportation needs - medical: Not on file  . Transportation needs - non-medical: Not on file  Occupational History  . Not on file  Tobacco Use  . Smoking status: Former Research scientist (life sciences)  . Smokeless tobacco: Former Systems developer    Types: Chew  Substance and Sexual Activity  . Alcohol use: No  . Drug use: No  . Sexual activity: Yes  Other Topics Concern  . 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    Feels safe in relationship, wears seat belt, owns guns in home    Current Meds  Medication Sig  . fluticasone (FLONASE) 50 MCG/ACT nasal spray Place 2 sprays into both nostrils daily.  . metoprolol succinate (TOPROL-XL) 25 MG 24 hr tablet Take 25 mg by mouth daily.  . Omega-3  Fatty Acids (FISH OIL) 1000 MG CAPS Take 520 mg by mouth 4 (four) times daily.   . pantoprazole (PROTONIX) 20 MG tablet Take 1 tablet (20 mg total) by mouth every morning. 30 minutes before food  . UNABLE TO FIND Med Name: Tumeric  . [DISCONTINUED] metoprolol tartrate (LOPRESSOR) 25 MG tablet Take by mouth.  . [DISCONTINUED] pantoprazole (PROTONIX) 40 MG tablet Take by mouth.   Not on File No results found for this or any previous visit (from the past 2160 hour(s)). Objective  Body mass index is 34.67 kg/m. Wt Readings from Last 3 Encounters:  03/09/17 300 lb (136.1 kg)  10/28/15 293 lb 12.8 oz (133.3 kg)  08/26/15 298 lb (135.2 kg)   Temp Readings from Last 3 Encounters:  03/09/17 98 F (36.7 C) (Oral)   BP Readings from Last 3 Encounters:  03/09/17 112/78  10/28/15 (!) 104/58  08/26/15 120/80   Pulse Readings from Last 3 Encounters:  03/09/17 (!) 48  10/28/15 (!) 54   08/26/15 61   O2 sat room air 98%   Physical Exam  Constitutional: He is oriented to person, place, and time and well-developed, well-nourished, and in no distress.  HENT:  Head: Normocephalic and atraumatic.  Mouth/Throat: Oropharynx is clear and moist and mucous membranes are normal.  Eyes: Conjunctivae are normal. Pupils are equal, round, and reactive to light.  Cardiovascular: Regular rhythm and normal heart sounds.  Pulmonary/Chest: Effort normal and breath sounds normal.  Abdominal: Soft. Bowel sounds are normal. There is no tenderness.  Neurological: He is alert and oriented to person, place, and time. Gait normal. Gait normal.  Skin: Skin is warm, dry and intact.  Psychiatric: Mood, memory, affect and judgment normal.  Nursing note and vitals reviewed.   Assessment   1. H/o palpitations, PACs, PVCs (noted holter readings 2013), sinus bradycardia w/o sx's, valve abnormalities per echos [facility]:  Reviewed echo [facility] 10/18/11 mildly dilated b/l atrium, LVOT, mild inferior hypokinesis, mild LVH, mild pulmonary regurgitation, mild TR, mild to moderate MR, Fibrocalcified moderatory band in RV  Reviewed echo [facility] 10/23/14 mild biatrial enlargement, mildly dilated ascending aorta, normal LV function, mild inferior hypokinesis, mild LVH, grade 1 DD, trace pulmonary regurgitation, mild TR, mild  MR, prominent moderator band in RV per report reviewed  Stress test [facility] 10/13/14 per report reviewed EF 61%, small mild fixed apical wall defect, medium sized moderate intensity inferior wall defect with partial reversibility, dilated LV with impression equivocal stress test with nl LVEF.   CCTA 10/21/14 [facility] reviewed calcium score 6, right dominant system, nl coronaries.   2. GERD  3. H/o HLD diet noncompliant  Reviewed prior lipid labs  TGs 178>154>133 Total cholesterol 201 LDL 119>124>139   4. Vision problems s/p lasik surgery 15 years ago 5. HM  Plan  1. Will refer to  Dr. Nehemiah Massed to re-assess cardiac diagnoses and treatment plan Consider repeat echo, holter, EKG if deemed necessary  On Metoprolol XL 25 mg qd from review of chart since 2013   2. Reduce protonix 40 mg qd to 20 mg qd  3. Disc healthy diet and exercise  Reduce [employer] intake he eats there for free working for the company   4. rec f/u with [facility]  5. Check fasting labs CMET, CBC, lipid, UA (h/o hematuria), TSH, T4, A1C, PSA, hep B status  Declines flu shot  Tdap pt thinks he had in 2015 vs 3-4 years ago per pt will have CMA check vx database Check hep B  status  FH prostate cancer in dad last PSA 0.8 02/15/16   Former smoker quit 15-20 years ago smoker x 10 years 1/2 ppd max 1 ppd no FH lung cancer Former chew  Congratulated on quitting both   Eye MD -[facility]   Dentist  Dr. Rosalio Loud   Former PCP Dr. Conley Rolls Sie/Dr. Laurelyn Sickle [facility] records reviewed and chart updated  -prior ABIs b/l negative   Of note this was not annual wellness visit today   Provider: Dr. Olivia Mackie McLean-Scocuzza-Internal Medicine

## 2017-03-23 ENCOUNTER — Other Ambulatory Visit: Payer: BLUE CROSS/BLUE SHIELD

## 2017-05-07 DIAGNOSIS — L255 Unspecified contact dermatitis due to plants, except food: Secondary | ICD-10-CM | POA: Diagnosis not present

## 2017-05-23 ENCOUNTER — Telehealth: Payer: Self-pay | Admitting: Radiology

## 2017-05-23 ENCOUNTER — Other Ambulatory Visit: Payer: Self-pay | Admitting: Internal Medicine

## 2017-05-23 DIAGNOSIS — Z0000 Encounter for general adult medical examination without abnormal findings: Secondary | ICD-10-CM

## 2017-05-23 DIAGNOSIS — Z1389 Encounter for screening for other disorder: Secondary | ICD-10-CM

## 2017-05-23 DIAGNOSIS — R739 Hyperglycemia, unspecified: Secondary | ICD-10-CM

## 2017-05-23 DIAGNOSIS — E669 Obesity, unspecified: Secondary | ICD-10-CM

## 2017-05-23 DIAGNOSIS — E559 Vitamin D deficiency, unspecified: Secondary | ICD-10-CM

## 2017-05-23 DIAGNOSIS — Z8042 Family history of malignant neoplasm of prostate: Secondary | ICD-10-CM

## 2017-05-23 DIAGNOSIS — Z1329 Encounter for screening for other suspected endocrine disorder: Secondary | ICD-10-CM

## 2017-05-23 DIAGNOSIS — Z0184 Encounter for antibody response examination: Secondary | ICD-10-CM

## 2017-05-23 DIAGNOSIS — Z1159 Encounter for screening for other viral diseases: Secondary | ICD-10-CM

## 2017-05-23 DIAGNOSIS — Z13220 Encounter for screening for lipoid disorders: Secondary | ICD-10-CM

## 2017-05-23 DIAGNOSIS — Z125 Encounter for screening for malignant neoplasm of prostate: Secondary | ICD-10-CM

## 2017-05-23 NOTE — Telephone Encounter (Signed)
Pt coming in for labs tomorrow, future labs that were placed have expired. Please reorder labs future. THank you.

## 2017-05-23 NOTE — Telephone Encounter (Signed)
Labs in

## 2017-05-24 ENCOUNTER — Other Ambulatory Visit: Payer: BLUE CROSS/BLUE SHIELD

## 2017-05-29 ENCOUNTER — Other Ambulatory Visit (INDEPENDENT_AMBULATORY_CARE_PROVIDER_SITE_OTHER): Payer: BLUE CROSS/BLUE SHIELD

## 2017-05-29 DIAGNOSIS — Z1329 Encounter for screening for other suspected endocrine disorder: Secondary | ICD-10-CM

## 2017-05-29 DIAGNOSIS — R739 Hyperglycemia, unspecified: Secondary | ICD-10-CM

## 2017-05-29 DIAGNOSIS — Z1159 Encounter for screening for other viral diseases: Secondary | ICD-10-CM

## 2017-05-29 DIAGNOSIS — E559 Vitamin D deficiency, unspecified: Secondary | ICD-10-CM | POA: Diagnosis not present

## 2017-05-29 DIAGNOSIS — E669 Obesity, unspecified: Secondary | ICD-10-CM | POA: Diagnosis not present

## 2017-05-29 DIAGNOSIS — Z0000 Encounter for general adult medical examination without abnormal findings: Secondary | ICD-10-CM

## 2017-05-29 DIAGNOSIS — Z8042 Family history of malignant neoplasm of prostate: Secondary | ICD-10-CM | POA: Diagnosis not present

## 2017-05-29 DIAGNOSIS — Z1389 Encounter for screening for other disorder: Secondary | ICD-10-CM | POA: Diagnosis not present

## 2017-05-29 DIAGNOSIS — Z0184 Encounter for antibody response examination: Secondary | ICD-10-CM | POA: Diagnosis not present

## 2017-05-29 DIAGNOSIS — Z125 Encounter for screening for malignant neoplasm of prostate: Secondary | ICD-10-CM | POA: Diagnosis not present

## 2017-05-29 LAB — CBC WITH DIFFERENTIAL/PLATELET
Basophils Absolute: 0 10*3/uL (ref 0.0–0.1)
Basophils Relative: 0.6 % (ref 0.0–3.0)
Eosinophils Absolute: 0.2 10*3/uL (ref 0.0–0.7)
Eosinophils Relative: 3.5 % (ref 0.0–5.0)
HCT: 44.3 % (ref 39.0–52.0)
Hemoglobin: 15 g/dL (ref 13.0–17.0)
LYMPHS ABS: 1.7 10*3/uL (ref 0.7–4.0)
Lymphocytes Relative: 34.2 % (ref 12.0–46.0)
MCHC: 34 g/dL (ref 30.0–36.0)
MCV: 88.5 fl (ref 78.0–100.0)
MONOS PCT: 9.1 % (ref 3.0–12.0)
Monocytes Absolute: 0.4 10*3/uL (ref 0.1–1.0)
NEUTROS ABS: 2.6 10*3/uL (ref 1.4–7.7)
NEUTROS PCT: 52.6 % (ref 43.0–77.0)
Platelets: 196 10*3/uL (ref 150.0–400.0)
RBC: 5 Mil/uL (ref 4.22–5.81)
RDW: 14.3 % (ref 11.5–15.5)
WBC: 4.9 10*3/uL (ref 4.0–10.5)

## 2017-05-29 LAB — URINALYSIS, ROUTINE W REFLEX MICROSCOPIC
BILIRUBIN URINE: NEGATIVE
Hgb urine dipstick: NEGATIVE
KETONES UR: NEGATIVE
LEUKOCYTES UA: NEGATIVE
NITRITE: NEGATIVE
PH: 6 (ref 5.0–8.0)
RBC / HPF: NONE SEEN (ref 0–?)
SPECIFIC GRAVITY, URINE: 1.025 (ref 1.000–1.030)
Total Protein, Urine: NEGATIVE
UROBILINOGEN UA: 0.2 (ref 0.0–1.0)
Urine Glucose: NEGATIVE
WBC, UA: NONE SEEN (ref 0–?)

## 2017-05-29 LAB — LIPID PANEL
Cholesterol: 192 mg/dL (ref 0–200)
HDL: 33.6 mg/dL — ABNORMAL LOW (ref >39.00)
LDL CALC: 135 mg/dL — AB (ref 0–99)
NonHDL: 158.24
TRIGLYCERIDES: 118 mg/dL (ref 0.0–149.0)
Total CHOL/HDL Ratio: 6
VLDL: 23.6 mg/dL (ref 0.0–40.0)

## 2017-05-29 LAB — VITAMIN D 25 HYDROXY (VIT D DEFICIENCY, FRACTURES): VITD: 22.92 ng/mL — AB (ref 30.00–100.00)

## 2017-05-29 LAB — COMPREHENSIVE METABOLIC PANEL
ALT: 26 U/L (ref 0–53)
AST: 16 U/L (ref 0–37)
Albumin: 4.4 g/dL (ref 3.5–5.2)
Alkaline Phosphatase: 55 U/L (ref 39–117)
BUN: 11 mg/dL (ref 6–23)
CHLORIDE: 105 meq/L (ref 96–112)
CO2: 27 meq/L (ref 19–32)
CREATININE: 0.93 mg/dL (ref 0.40–1.50)
Calcium: 9.2 mg/dL (ref 8.4–10.5)
GFR: 92.59 mL/min (ref >60.00)
Glucose, Bld: 96 mg/dL (ref 70–99)
Potassium: 4.3 mEq/L (ref 3.5–5.1)
SODIUM: 141 meq/L (ref 135–145)
Total Bilirubin: 0.6 mg/dL (ref 0.2–1.2)
Total Protein: 6.7 g/dL (ref 6.0–8.3)

## 2017-05-29 LAB — PSA: PSA: 0.89 ng/mL (ref 0.10–4.00)

## 2017-05-29 LAB — TSH: TSH: 2.32 u[IU]/mL (ref 0.35–4.50)

## 2017-05-29 LAB — T4, FREE: Free T4: 0.83 ng/dL (ref 0.60–1.60)

## 2017-05-29 LAB — HEMOGLOBIN A1C: Hgb A1c MFr Bld: 5.5 % (ref 4.6–6.5)

## 2017-05-29 NOTE — Addendum Note (Signed)
Addended by: Arby Barrette on: 05/29/2017 08:02 AM   Modules accepted: Orders

## 2017-05-30 LAB — HEPATITIS B SURFACE ANTIBODY, QUANTITATIVE: Hepatitis B-Post: <5 m[IU]/mL — ABNORMAL LOW (ref >=10)

## 2017-05-30 LAB — HEPATITIS B SURFACE ANTIGEN: HEP B S AG: NONREACTIVE

## 2017-06-09 ENCOUNTER — Ambulatory Visit: Payer: BLUE CROSS/BLUE SHIELD | Admitting: Internal Medicine

## 2017-06-09 ENCOUNTER — Encounter: Payer: Self-pay | Admitting: Internal Medicine

## 2017-06-09 VITALS — BP 110/74 | HR 57 | Temp 98.2°F | Ht 79.0 in | Wt 297.5 lb

## 2017-06-09 DIAGNOSIS — Z23 Encounter for immunization: Secondary | ICD-10-CM

## 2017-06-09 DIAGNOSIS — E669 Obesity, unspecified: Secondary | ICD-10-CM

## 2017-06-09 DIAGNOSIS — E785 Hyperlipidemia, unspecified: Secondary | ICD-10-CM

## 2017-06-09 DIAGNOSIS — R5383 Other fatigue: Secondary | ICD-10-CM

## 2017-06-09 DIAGNOSIS — E559 Vitamin D deficiency, unspecified: Secondary | ICD-10-CM | POA: Insufficient documentation

## 2017-06-09 DIAGNOSIS — R001 Bradycardia, unspecified: Secondary | ICD-10-CM | POA: Diagnosis not present

## 2017-06-09 NOTE — Progress Notes (Signed)
Chief Complaint  Patient presents with  . Follow-up   F/u  1. HLD reviewed pt has started making better choices of food at his employer [employer] 2. Obesity dic'ed wt loss goal of 275 lbs he is currently not exercising due to working long hrs and feeling tired at home  3. Vit D def taking D3 5000 IU Qd  4. H/o SB, PVCs, PACs he needs to resch appt with Dr. Nehemiah Massed  5. Agreeable to get hep B vaccine today has already had 1 series 15 years ago but not immune    Review of Systems  Constitutional: Negative for weight loss.  HENT: Negative for hearing loss.   Eyes: Negative for blurred vision.  Respiratory: Negative for shortness of breath.   Cardiovascular: Negative for chest pain.  Gastrointestinal: Negative for abdominal pain.  Musculoskeletal: Negative for falls.  Skin: Negative for rash.       Poison ivy rash improved   Neurological: Negative for headaches.  Psychiatric/Behavioral: Negative for depression.   Past Medical History:  Diagnosis Date  . Chicken pox   . GERD (gastroesophageal reflux disease)   . Hiatal hernia    small noted 2013   . Hyperlipidemia   . Kidney stone   . Premature atrial contractions   . Premature ventricular contraction   . Vitamin D deficiency   . Wrist fracture    right    Past Surgical History:  Procedure Laterality Date  . LASIK     2004   . TONSILLECTOMY AND ADENOIDECTOMY  1978   Family History  Problem Relation Age of Onset  . Cancer Mother        kidney cancer   . Kidney disease Mother   . Diabetes Mother   . Hearing loss Mother   . Heart disease Mother   . Cancer Father        prostate, colon   . Kidney disease Father   . Diabetes Father   . Hearing loss Father   . Stroke Maternal Grandmother   . Cancer Maternal Grandfather        bladder cancer, +smoker   . Alcohol abuse Maternal Grandfather    Social History   Socioeconomic History  . Marital status: Married    Spouse name: Not on file  . Number of children: Not  on file  . Years of education: Not on file  . Highest education level: Not on file  Occupational History  . Not on file  Social Needs  . Financial resource strain: Not on file  . Food insecurity:    Worry: Not on file    Inability: Not on file  . Transportation needs:    Medical: Not on file    Non-medical: Not on file  Tobacco Use  . Smoking status: Former Research scientist (life sciences)  . Smokeless tobacco: Former Systems developer    Types: Chew  Substance and Sexual Activity  . Alcohol use: No  . Drug use: No  . Sexual activity: Yes  Lifestyle  . Physical activity:    Days per week: Not on file    Minutes per session: Not on file  . Stress: Not on file  Relationships  . Social connections:    Talks on phone: Not on file    Gets together: Not on file    Attends religious service: Not on file    Active member of club or organization: Not on file    Attends meetings of clubs or organizations: Not on file  Relationship status: Not on file  . Intimate partner violence:    Fear of current or ex partner: Not on file    Emotionally abused: Not on file    Physically abused: Not on file    Forced sexual activity: Not on file  Other Topics Concern  . 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    Feels safe in relationship, wears seat belt, owns guns in home    Current Meds  Medication Sig  . Cholecalciferol (VITAMIN D3) 5000 units CAPS Take 1 capsule by mouth daily.  . fluticasone (FLONASE) 50 MCG/ACT nasal spray Place 2 sprays into both nostrils daily.  . metoprolol succinate (TOPROL-XL) 25 MG 24 hr tablet Take 25 mg by mouth daily.  . Omega-3 Fatty Acids (FISH OIL) 1000 MG CAPS Take 520 mg by mouth 4 (four) times daily.   . pantoprazole (PROTONIX) 20 MG tablet Take 1 tablet (20 mg total) by mouth every morning. 30 minutes before food  . UNABLE TO FIND Med Name: Tumeric   No Known Allergies Recent Results  (from the past 2160 hour(s))  Vitamin D (25 hydroxy)     Status: Abnormal   Collection Time: 05/29/17  8:02 AM  Result Value Ref Range   VITD 22.92 (L) 30.00 - 100.00 ng/mL  Comprehensive metabolic panel     Status: None   Collection Time: 05/29/17  8:02 AM  Result Value Ref Range   Sodium 141 135 - 145 mEq/L   Potassium 4.3 3.5 - 5.1 mEq/L   Chloride 105 96 - 112 mEq/L   CO2 27 19 - 32 mEq/L   Glucose, Bld 96 70 - 99 mg/dL   BUN 11 6 - 23 mg/dL   Creatinine, Ser 0.93 0.40 - 1.50 mg/dL   Total Bilirubin 0.6 0.2 - 1.2 mg/dL   Alkaline Phosphatase 55 39 - 117 U/L   AST 16 0 - 37 U/L   ALT 26 0 - 53 U/L   Total Protein 6.7 6.0 - 8.3 g/dL   Albumin 4.4 3.5 - 5.2 g/dL   Calcium 9.2 8.4 - 10.5 mg/dL   GFR 92.59 >60.00 mL/min  CBC w/Diff     Status: None   Collection Time: 05/29/17  8:02 AM  Result Value Ref Range   WBC 4.9 4.0 - 10.5 K/uL   RBC 5.00 4.22 - 5.81 Mil/uL   Hemoglobin 15.0 13.0 - 17.0 g/dL   HCT 44.3 39.0 - 52.0 %   MCV 88.5 78.0 - 100.0 fl   MCHC 34.0 30.0 - 36.0 g/dL   RDW 14.3 11.5 - 15.5 %   Platelets 196.0 150.0 - 400.0 K/uL   Neutrophils Relative % 52.6 43.0 - 77.0 %   Lymphocytes Relative 34.2 12.0 - 46.0 %   Monocytes Relative 9.1 3.0 - 12.0 %   Eosinophils Relative 3.5 0.0 - 5.0 %   Basophils Relative 0.6 0.0 - 3.0 %   Neutro Abs 2.6 1.4 - 7.7 K/uL   Lymphs Abs 1.7 0.7 - 4.0 K/uL   Monocytes Absolute 0.4 0.1 - 1.0 K/uL   Eosinophils Absolute 0.2 0.0 - 0.7 K/uL   Basophils Absolute 0.0 0.0 - 0.1 K/uL  TSH     Status: None   Collection Time: 05/29/17  8:02 AM  Result Value Ref Range   TSH 2.32 0.35 - 4.50 uIU/mL  T4, free  Status: None   Collection Time: 05/29/17  8:02 AM  Result Value Ref Range   Free T4 0.83 0.60 - 1.60 ng/dL    Comment: Specimens from patients who are undergoing biotin therapy and /or ingesting biotin supplements may contain high levels of biotin.  The higher biotin concentration in these specimens interferes with this Free T4  assay.  Specimens that contain high levels  of biotin may cause false high results for this Free T4 assay.  Please interpret results in light of the total clinical presentation of the patient.    Lipid panel     Status: Abnormal   Collection Time: 05/29/17  8:02 AM  Result Value Ref Range   Cholesterol 192 0 - 200 mg/dL    Comment: ATP III Classification       Desirable:  < 200 mg/dL               Borderline High:  200 - 239 mg/dL          High:  > = 240 mg/dL   Triglycerides 118.0 0.0 - 149.0 mg/dL    Comment: Normal:  <150 mg/dLBorderline High:  150 - 199 mg/dL   HDL 33.60 (L) >39.00 mg/dL   VLDL 23.6 0.0 - 40.0 mg/dL   LDL Cholesterol 135 (H) 0 - 99 mg/dL   Total CHOL/HDL Ratio 6     Comment:                Men          Women1/2 Average Risk     3.4          3.3Average Risk          5.0          4.42X Average Risk          9.6          7.13X Average Risk          15.0          11.0                       NonHDL 158.24     Comment: NOTE:  Non-HDL goal should be 30 mg/dL higher than patient's LDL goal (i.e. LDL goal of < 70 mg/dL, would have non-HDL goal of < 100 mg/dL)  Hemoglobin A1c     Status: None   Collection Time: 05/29/17  8:02 AM  Result Value Ref Range   Hgb A1c MFr Bld 5.5 4.6 - 6.5 %    Comment: Glycemic Control Guidelines for People with Diabetes:Non Diabetic:  <6%Goal of Therapy: <7%Additional Action Suggested:  >8%   PSA     Status: None   Collection Time: 05/29/17  8:02 AM  Result Value Ref Range   PSA 0.89 0.10 - 4.00 ng/mL    Comment: Test performed using Access Hybritech PSA Assay, a parmagnetic partical, chemiluminecent immunoassay.  Hepatitis B surface antibody     Status: Abnormal   Collection Time: 05/29/17  8:02 AM  Result Value Ref Range   Hepatitis B-Post <5 (L) > OR = 10 mIU/mL    Comment: . Patient does not have immunity to hepatitis B virus. . For additional information, please refer to http://education.questdiagnostics.com/faq/FAQ105 (This link is  being provided for informational/ educational purposes only).   Hepatitis B surface antigen     Status: None   Collection Time: 05/29/17  8:02 AM  Result Value Ref Range   Hepatitis  B Surface Ag NON-REACTIVE NON-REACTI  Urinalysis, Routine w reflex microscopic     Status: Abnormal   Collection Time: 05/29/17  8:02 AM  Result Value Ref Range   Color, Urine YELLOW Yellow;Lt. Yellow   APPearance CLEAR Clear   Specific Gravity, Urine 1.025 1.000 - 1.030   pH 6.0 5.0 - 8.0   Total Protein, Urine NEGATIVE Negative   Urine Glucose NEGATIVE Negative   Ketones, ur NEGATIVE Negative   Bilirubin Urine NEGATIVE Negative   Hgb urine dipstick NEGATIVE Negative   Urobilinogen, UA 0.2 0.0 - 1.0   Leukocytes, UA NEGATIVE Negative   Nitrite NEGATIVE Negative   WBC, UA none seen 0-2/hpf   RBC / HPF none seen 0-2/hpf   Mucus, UA Presence of (A) None   Squamous Epithelial / LPF Rare(0-4/hpf) Rare(0-4/hpf)   Objective  Body mass index is 34.38 kg/m. Wt Readings from Last 3 Encounters:  06/09/17 297 lb 8 oz (134.9 kg)  03/09/17 300 lb (136.1 kg)  10/28/15 293 lb 12.8 oz (133.3 kg)   Temp Readings from Last 3 Encounters:  06/09/17 98.2 F (36.8 C) (Oral)  03/09/17 98 F (36.7 C) (Oral)   BP Readings from Last 3 Encounters:  06/09/17 110/74  03/09/17 112/78  10/28/15 (!) 104/58   Pulse Readings from Last 3 Encounters:  06/09/17 (!) 57  03/09/17 (!) 48  10/28/15 (!) 54    Physical Exam  Constitutional: He is oriented to person, place, and time. Vital signs are normal. He appears well-developed and well-nourished. He is cooperative.  HENT:  Head: Normocephalic and atraumatic.  Mouth/Throat: Oropharynx is clear and moist and mucous membranes are normal.  Eyes: Pupils are equal, round, and reactive to light. Conjunctivae are normal.  Cardiovascular: Normal rate, regular rhythm and normal heart sounds.  Pulmonary/Chest: Effort normal and breath sounds normal.  Neurological: He is alert  and oriented to person, place, and time. Gait normal.  Skin: Skin is warm, dry and intact.  Psychiatric: He has a normal mood and affect. His speech is normal and behavior is normal. Judgment and thought content normal. Cognition and memory are normal.  Nursing note and vitals reviewed.   Assessment   1. Fatigue could be related to SB and BB use. Pending appt with Dr. Nehemiah Massed cardiology second opinion to see with h/o PACs/PVCs if needs to be on Toprol XL 25 mg qd former cardiologist Dr. Laurelyn Sickle rec he be on this previously  2. HLD/obesity BMI >34  3. Vitamin D def  4. HM  Plan   1.  Pt needs to call and resch cardiac referral  I would think stop BB use will let cardiology weight in  2. Disc healthy diet options and exercise to lose by next visit goal 275 lbs with exercise and healthy diet choices  Disc adipex today and pt given info  Goal wt for normal BMI would be <275 disc with pt today  3. On D3 5000 IU qd  4.  Declines flu shot  Tdap pt thinks he had in 2015 vs 3-4 years ago per pt will have CMA check vx database Given 1/3 hep B vaccines today needs to sch 6/17 and 11/17 FH prostate cancer in dad last PSA 0.8 02/15/16  Consider check MMR and testosterone levels in future   Former smoker quit 15-20 years ago smoker x 10 years 1/2 ppd max 1 ppd no FH lung cancer Former chew  Congratulated on quitting both   Eye MD -[facility]  Dentist  Dr. Rosalio Loud   Provider: Dr. Olivia Mackie McLean-Scocuzza-Internal Medicine

## 2017-06-09 NOTE — Patient Instructions (Addendum)
Follow up in 4 months Please sch hep B vaccine 07/10/17 and 12/10/17    Vitamin D Deficiency Vitamin D deficiency is  when your body does not have enough vitamin D. Vitamin D is important to your body for many reasons:  It helps the body to absorb two important minerals, called calcium and phosphorus.  It plays a role in bone health.  It may help to prevent some diseases, such as diabetes and multiple sclerosis.  It plays a role in muscle function, including heart function.  You can get vitamin D by:  Eating foods that naturally contain vitamin D.  Eating or drinking milk or other dairy products that have vitamin D added to them.  Taking a vitamin D supplement or a multivitamin supplement that contains vitamin D.  Being in the sun. Your body naturally makes vitamin D when your skin is exposed to sunlight. Your body changes the sunlight into a form of the vitamin that the body can use.  If vitamin D deficiency is severe, it can cause a condition in which your bones become soft. In adults, this condition is called osteomalacia. In children, this condition is called rickets. What are the causes? Vitamin D deficiency may be caused by:  Not eating enough foods that contain vitamin D.  Not getting enough sun exposure.  Having certain digestive system diseases that make it difficult for your body to absorb vitamin D. These diseases include Crohn disease, chronic pancreatitis, and cystic fibrosis.  Having a surgery in which a part of the stomach or a part of the small intestine is removed.  Being obese.  Having chronic kidney disease or liver disease.  What increases the risk? This condition is more likely to develop in:  Older people.  People who do not spend much time outdoors.  People who live in a long-term care facility.  People who have had broken bones.  People with weak or thin bones (osteoporosis).  People who have a disease or condition that changes how the  body absorbs vitamin D.  People who have dark skin.  People who take certain medicines, such as steroid medicines or certain seizure medicines.  People who are overweight or obese.  What are the signs or symptoms? In mild cases of vitamin D deficiency, there may not be any symptoms. If the condition is severe, symptoms may include:  Bone pain.  Muscle pain.  Falling often.  Broken bones caused by a minor injury.  How is this diagnosed? This condition is usually diagnosed with a blood test. How is this treated? Treatment for this condition may depend on what caused the condition. Treatment options include:  Taking vitamin D supplements.  Taking a calcium supplement. Your health care provider will suggest what dose is best for you.  Follow these instructions at home:  Take medicines and supplements only as told by your health care provider.  Eat foods that contain vitamin D. Choices include: ? Fortified dairy products, cereals, or juices. Fortified means that vitamin D has been added to the food. Check the label on the package to be sure. ? Fatty fish, such as salmon or trout. ? Eggs. ? Oysters.  Do not use a tanning bed.  Maintain a healthy weight. Lose weight, if needed.  Keep all follow-up visits as told by your health care provider. This is important. Contact a health care provider if:  Your symptoms do not go away.  You feel like throwing up (nausea) or you throw up (vomit).  You have fewer bowel movements than usual or it is difficult for you to have a bowel movement (constipation). This information is not intended to replace advice given to you by your health care provider. Make sure you discuss any questions you have with your health care provider. Document Released: 04/04/2011 Document Revised: 06/24/2015 Document Reviewed: 05/28/2014 Elsevier Interactive Patient Education  2018 [employer].  Hepatitis B Vaccine, Recombinant injection What is this  medicine? HEPATITIS B VACCINE (hep uh TAHY tis B VAK seen) is a vaccine. It is used to prevent an infection with the hepatitis B virus. This medicine may be used for other purposes; ask your health care provider or pharmacist if you have questions. COMMON BRAND NAME(S): Engerix-B, Recombivax HB What should I tell my health care provider before I take this medicine? They need to know if you have any of these conditions: -fever, infection -heart disease -hepatitis B infection -immune system problems -kidney disease -an unusual or allergic reaction to vaccines, yeast, other medicines, foods, dyes, or preservatives -pregnant or trying to get pregnant -breast-feeding How should I use this medicine? This vaccine is for injection into a muscle. It is given by a health care professional. A copy of Vaccine Information Statements will be given before each vaccination. Read this sheet carefully each time. The sheet may change frequently. Talk to your pediatrician regarding the use of this medicine in children. While this drug may be prescribed for children as young as newborn for selected conditions, precautions do apply. Overdosage: If you think you have taken too much of this medicine contact a poison control center or emergency room at once. NOTE: This medicine is only for you. Do not share this medicine with others. What if I miss a dose? It is important not to miss your dose. Call your doctor or health care professional if you are unable to keep an appointment. What may interact with this medicine? -medicines that suppress your immune function like adalimumab, anakinra, infliximab -medicines to treat cancer -steroid medicines like prednisone or cortisone This list may not describe all possible interactions. Give your health care provider a list of all the medicines, herbs, non-prescription drugs, or dietary supplements you use. Also tell them if you smoke, drink alcohol, or use illegal drugs.  Some items may interact with your medicine. What should I watch for while using this medicine? See your health care provider for all shots of this vaccine as directed. You must have 3 shots of this vaccine for protection from hepatitis B infection. Tell your doctor right away if you have any serious or unusual side effects after getting this vaccine. What side effects may I notice from receiving this medicine? Side effects that you should report to your doctor or health care professional as soon as possible: -allergic reactions like skin rash, itching or hives, swelling of the face, lips, or tongue -breathing problems -confused, irritated -fast, irregular heartbeat -flu-like syndrome -numb, tingling pain -seizures -unusually weak or tired Side effects that usually do not require medical attention (report to your doctor or health care professional if they continue or are bothersome): -diarrhea -fever -headache -loss of appetite -muscle pain -nausea -pain, redness, swelling, or irritation at site where injected -tiredness This list may not describe all possible side effects. Call your doctor for medical advice about side effects. You may report side effects to FDA at 1-800-FDA-1088. Where should I keep my medicine? This drug is given in a hospital or clinic and will not be stored at home.  NOTE: This sheet is a summary. It may not cover all possible information. If you have questions about this medicine, talk to your doctor, pharmacist, or health care provider.  2018 Elsevier/Gold Standard (2013-05-13 13:26:01)  Phentermine tablets or capsules What is this medicine? PHENTERMINE (FEN ter meen) decreases your appetite. It is used with a reduced calorie diet and exercise to help you lose weight. This medicine may be used for other purposes; ask your health care provider or pharmacist if you have questions. COMMON BRAND NAME(S): Adipex-P, Atti-Plex P, Atti-Plex P Spansule, Fastin, Lomaira,  Pro-Fast, Tara-8 What should I tell my health care provider before I take this medicine? They need to know if you have any of these conditions: -agitation -glaucoma -heart disease -high blood pressure -history of substance abuse -lung disease called Primary Pulmonary Hypertension (PPH) -taken an MAOI like Carbex, Eldepryl, Marplan, Nardil, or Parnate in last 14 days -thyroid disease -an unusual or allergic reaction to phentermine, other medicines, foods, dyes, or preservatives -pregnant or trying to get pregnant -breast-feeding How should I use this medicine? Take this medicine by mouth with a glass of water. Follow the directions on the prescription label. The instructions for use may differ based on the product and dose you are taking. Avoid taking this medicine in the evening. It may interfere with sleep. Take your doses at regular intervals. Do not take your medicine more often than directed. Talk to your pediatrician regarding the use of this medicine in children. While this drug may be prescribed for children 17 years or older for selected conditions, precautions do apply. Overdosage: If you think you have taken too much of this medicine contact a poison control center or emergency room at once. NOTE: This medicine is only for you. Do not share this medicine with others. What if I miss a dose? If you miss a dose, take it as soon as you can. If it is almost time for your next dose, take only that dose. Do not take double or extra doses. What may interact with this medicine? Do not take this medicine with any of the following medications: -duloxetine -MAOIs like Carbex, Eldepryl, Marplan, Nardil, and Parnate -medicines for colds or breathing difficulties like pseudoephedrine or phenylephrine -procarbazine -sibutramine -SSRIs like citalopram, escitalopram, fluoxetine, fluvoxamine, paroxetine, and sertraline -stimulants like dexmethylphenidate, methylphenidate or  modafinil -venlafaxine This medicine may also interact with the following medications: -medicines for diabetes This list may not describe all possible interactions. Give your health care provider a list of all the medicines, herbs, non-prescription drugs, or dietary supplements you use. Also tell them if you smoke, drink alcohol, or use illegal drugs. Some items may interact with your medicine. What should I watch for while using this medicine? Notify your physician immediately if you become short of breath while doing your normal activities. Do not take this medicine within 6 hours of bedtime. It can keep you from getting to sleep. Avoid drinks that contain caffeine and try to stick to a regular bedtime every night. This medicine was intended to be used in addition to a healthy diet and exercise. The best results are achieved this way. This medicine is only indicated for short-term use. Eventually your weight loss may level out. At that point, the drug will only help you maintain your new weight. Do not increase or in any way change your dose without consulting your doctor. You may get drowsy or dizzy. Do not drive, use machinery, or do anything that needs mental alertness until you  know how this medicine affects you. Do not stand or sit up quickly, especially if you are an older patient. This reduces the risk of dizzy or fainting spells. Alcohol may increase dizziness and drowsiness. Avoid alcoholic drinks. What side effects may I notice from receiving this medicine? Side effects that you should report to your doctor or health care professional as soon as possible: -chest pain, palpitations -depression or severe changes in mood -increased blood pressure -irritability -nervousness or restlessness -severe dizziness -shortness of breath -problems urinating -unusual swelling of the legs -vomiting Side effects that usually do not require medical attention (report to your doctor or health care  professional if they continue or are bothersome): -blurred vision or other eye problems -changes in sexual ability or desire -constipation or diarrhea -difficulty sleeping -dry mouth or unpleasant taste -headache -nausea This list may not describe all possible side effects. Call your doctor for medical advice about side effects. You may report side effects to FDA at 1-800-FDA-1088. Where should I keep my medicine? Keep out of the reach of children. This medicine can be abused. Keep your medicine in a safe place to protect it from theft. Do not share this medicine with anyone. Selling or giving away this medicine is dangerous and against the law. This medicine may cause accidental overdose and death if taken by other adults, children, or pets. Mix any unused medicine with a substance like cat litter or coffee grounds. Then throw the medicine away in a sealed container like a sealed bag or a coffee can with a lid. Do not use the medicine after the expiration date. Store at room temperature between 20 and 25 degrees C (68 and 77 degrees F). Keep container tightly closed. NOTE: This sheet is a summary. It may not cover all possible information. If you have questions about this medicine, talk to your doctor, pharmacist, or health care provider.  2018 Elsevier/Gold Standard (2014-10-17 12:53:15)  Exercising to Lose Weight Exercising can help you to lose weight. In order to lose weight through exercise, you need to do vigorous-intensity exercise. You can tell that you are exercising with vigorous intensity if you are breathing very hard and fast and cannot hold a conversation while exercising. Moderate-intensity exercise helps to maintain your current weight. You can tell that you are exercising at a moderate level if you have a higher heart rate and faster breathing, but you are still able to hold a conversation. How often should I exercise? Choose an activity that you enjoy and set realistic goals.  Your health care provider can help you to make an activity plan that works for you. Exercise regularly as directed by your health care provider. This may include:  Doing resistance training twice each week, such as: ? Push-ups. ? Sit-ups. ? Lifting weights. ? Using resistance bands.  Doing a given intensity of exercise for a given amount of time. Choose from these options: ? 150 minutes of moderate-intensity exercise every week. ? 75 minutes of vigorous-intensity exercise every week. ? A mix of moderate-intensity and vigorous-intensity exercise every week.  Children, pregnant women, people who are out of shape, people who are overweight, and older adults may need to consult a health care provider for individual recommendations. If you have any sort of medical condition, be sure to consult your health care provider before starting a new exercise program. What are some activities that can help me to lose weight?  Walking at a rate of at least 4.5 miles an hour.  Jogging  or running at a rate of 5 miles per hour.  Biking at a rate of at least 10 miles per hour.  Lap swimming.  Roller-skating or in-line skating.  Cross-country skiing.  Vigorous competitive sports, such as football, basketball, and soccer.  Jumping rope.  Aerobic dancing. How can I be more active in my day-to-day activities?  Use the stairs instead of the elevator.  Take a walk during your lunch break.  If you drive, park your car farther away from work or school.  If you take public transportation, get off one stop early and walk the rest of the way.  Make all of your phone calls while standing up and walking around.  Get up, stretch, and walk around every 30 minutes throughout the day. What guidelines should I follow while exercising?  Do not exercise so much that you hurt yourself, feel dizzy, or get very short of breath.  Consult your health care provider prior to starting a new exercise  program.  Wear comfortable clothes and shoes with good support.  Drink plenty of water while you exercise to prevent dehydration or heat stroke. Body water is lost during exercise and must be replaced.  Work out until you breathe faster and your heart beats faster. This information is not intended to replace advice given to you by your health care provider. Make sure you discuss any questions you have with your health care provider. Document Released: 02/12/2010 Document Revised: 06/18/2015 Document Reviewed: 06/13/2013 Elsevier Interactive Patient Education  [employer].

## 2017-06-12 NOTE — Progress Notes (Signed)
Patient not in [place].

## 2017-06-15 ENCOUNTER — Encounter: Payer: Self-pay | Admitting: Internal Medicine

## 2017-06-29 ENCOUNTER — Encounter: Payer: Self-pay | Admitting: Internal Medicine

## 2017-07-11 ENCOUNTER — Ambulatory Visit (INDEPENDENT_AMBULATORY_CARE_PROVIDER_SITE_OTHER): Payer: BLUE CROSS/BLUE SHIELD

## 2017-07-11 DIAGNOSIS — Z23 Encounter for immunization: Secondary | ICD-10-CM

## 2017-07-11 NOTE — Progress Notes (Signed)
Patient comes in today for second dose of hepatitis B vaccine. Administered in right deltoid IM. Patient tolerated well.

## 2017-08-21 DIAGNOSIS — D2260 Melanocytic nevi of unspecified upper limb, including shoulder: Secondary | ICD-10-CM | POA: Diagnosis not present

## 2017-08-21 DIAGNOSIS — L820 Inflamed seborrheic keratosis: Secondary | ICD-10-CM | POA: Diagnosis not present

## 2017-08-21 DIAGNOSIS — Z1283 Encounter for screening for malignant neoplasm of skin: Secondary | ICD-10-CM | POA: Diagnosis not present

## 2017-08-21 DIAGNOSIS — D225 Melanocytic nevi of trunk: Secondary | ICD-10-CM | POA: Diagnosis not present

## 2017-08-21 DIAGNOSIS — D485 Neoplasm of uncertain behavior of skin: Secondary | ICD-10-CM | POA: Diagnosis not present

## 2017-08-21 DIAGNOSIS — L720 Epidermal cyst: Secondary | ICD-10-CM | POA: Diagnosis not present

## 2017-09-27 ENCOUNTER — Encounter: Payer: Self-pay | Admitting: Internal Medicine

## 2017-10-08 ENCOUNTER — Encounter: Payer: Self-pay | Admitting: Internal Medicine

## 2017-10-09 ENCOUNTER — Other Ambulatory Visit: Payer: Self-pay | Admitting: Internal Medicine

## 2017-10-09 DIAGNOSIS — I493 Ventricular premature depolarization: Secondary | ICD-10-CM

## 2017-10-09 MED ORDER — METOPROLOL SUCCINATE ER 25 MG PO TB24: 25.0000 mg | ORAL_TABLET | Freq: Every day | ORAL | 1 refills | Status: DC

## 2017-10-12 ENCOUNTER — Ambulatory Visit: Payer: BLUE CROSS/BLUE SHIELD | Admitting: Internal Medicine

## 2017-10-12 ENCOUNTER — Encounter: Payer: Self-pay | Admitting: Internal Medicine

## 2017-10-12 VITALS — BP 112/72 | HR 61 | Temp 98.7°F | Ht 72.0 in | Wt 293.6 lb

## 2017-10-12 DIAGNOSIS — Z1159 Encounter for screening for other viral diseases: Secondary | ICD-10-CM | POA: Diagnosis not present

## 2017-10-12 DIAGNOSIS — E669 Obesity, unspecified: Secondary | ICD-10-CM

## 2017-10-12 DIAGNOSIS — I491 Atrial premature depolarization: Secondary | ICD-10-CM

## 2017-10-12 DIAGNOSIS — I493 Ventricular premature depolarization: Secondary | ICD-10-CM

## 2017-10-12 DIAGNOSIS — Z0184 Encounter for antibody response examination: Secondary | ICD-10-CM | POA: Diagnosis not present

## 2017-10-12 DIAGNOSIS — E785 Hyperlipidemia, unspecified: Secondary | ICD-10-CM

## 2017-10-12 DIAGNOSIS — E291 Testicular hypofunction: Secondary | ICD-10-CM | POA: Diagnosis not present

## 2017-10-12 DIAGNOSIS — R5383 Other fatigue: Secondary | ICD-10-CM

## 2017-10-12 DIAGNOSIS — R001 Bradycardia, unspecified: Secondary | ICD-10-CM

## 2017-10-12 NOTE — Patient Instructions (Addendum)
Call cardiology Dr. Nehemiah Massed to see if they think you need Metoprolol long term  F/u 6 months sch fasting labs   Premature Atrial Contraction A premature atrial contraction [facility]) is a kind of irregular heartbeat (arrhythmia). It happens when the heart beats too early and then pauses before beating again. PACs are also called skipped heartbeats because they may make you feel like your heart is stopping for a second, even though the heart does not actually skip a beat. The heart has four areas, or chambers. Normally, electrical signals spread across the heart and make all the chambers beat together. During a PAC, the upper chambers of the heart (right atrium and left atrium) beat too early, before they have had time to fill with blood. The heartbeat pauses afterward so the heart can fill with blood for the next beat. What are the causes? The cause of this condition is often unknown. Sometimes it is caused by heart disease or injury to the heart. What increases the risk? This condition is more likely to develop in adults who are 58 years of age or older and in children. Episodes may be triggered by:  Caffeine.  Stress.  Tiredness.  Alcohol.  Smoking.  Stimulant drugs.  Heart disease.  What are the signs or symptoms? Symptoms of this condition include:  A feeling that your heart skipped a beat. The first heartbeat after the "skipped" beat may feel more forceful.  A feeling that your heart is fluttering.  How is this diagnosed? This condition is diagnosed based on:  Your symptoms.  A physical exam. Your health care provider may listen to your heart.  Tests to rule out other conditions, such as a test that records the electrical impulses of the heart and assesses heart health (electrocardiogram, or ECG). If you have an ECG, you may need to wear a portable ECG machine (Holter monitor) that records your heart beats for 24 hours or more.  How is this treated?  Usually, treatment is  not needed for this condition. If you have episodes that happen often or if a cause is found, you may receive treatment for the underlying cause of your PACs. Follow these instructions at home: Lifestyle Follow these instructions as told by your health care provider:  Do not use any products that contain nicotine or tobacco, such as cigarettes and e-cigarettes. If you need help quitting, ask your health care provider.  If caffeine triggers episodes, do not eat, drink, or use anything with caffeine in it.  If caffeine does not seem to trigger episodes, consume caffeine in moderation.  If alcohol triggers episodes of PAC, do not drink alcohol.  If alcohol does not seem to trigger episodes, limit alcohol intake to no more than 1 drink a day for nonpregnant women and 2 drinks a day for men. One drink equals 12 oz of beer, 5 oz of wine, or 1 oz of hard liquor.  Exercise regularly. Ask your health care provider what type of exercise is safe for you.  Find healthy ways to manage stress. Avoid stressful situations when possible.  Try to get at least 7-9 hours of sleep each night, or as much as recommended by your health care provider.  Do not use illegal drugs.  General instructions  Take over-the-counter and prescription medicines only as told by your health care provider.  Keep all follow-up visits as told by your health care provider. This is important. Contact a health care provider if:  You feel your heart skipping  beats more than once a day.  Your heart skips beats and you feel dizzy, light-headed, or very tired. Get help right away if:  You have chest pain.  You have trouble breathing. This information is not intended to replace advice given to you by your health care provider. Make sure you discuss any questions you have with your health care provider. Document Released: 09/13/2013 Document Revised: 09/08/2015 Document Reviewed: 07/10/2015 Elsevier Interactive Patient  Education  2018 [employer].  Premature Ventricular Contraction A premature ventricular contraction (PVC) is a common irregularity in the normal heart rhythm. These contractions are extra heartbeats that start in the heart ventricles and occur too early in the normal sequence. During the PVC, the heart's normal electrical pathway is not used, so the beat is shorter and less effective. In most cases, these contractions come and go and do not require treatment. What are the causes? In many cases, the cause may not be known. Common causes of the condition include:  Smoking.  Drinking alcohol.  Caffeine.  Certain medicines.  Some illegal drugs.  Stress.  Certain medical conditions can also cause PVCs:  Changes in minerals in the blood (electrolytes).  Heart failure.  Heart valve problems.  Low blood oxygen levels or high carbon dioxide levels.  Heart attack, or coronary artery disease.  What are the signs or symptoms? The main symptom of this condition is a fast or skipped heartbeat (palpitations). Other symptoms include:  Chest pain.  Shortness of breath.  Feeling tired.  Dizziness.  In some cases, there are no symptoms. How is this diagnosed? This condition may be diagnosed based on:  Your medical history.  A physical exam. During the exam, the health care provider will check for irregular heartbeats.  Tests, such as: ? An ECG (electrocardiogram) to monitor the electrical activity of your heart. ? Holter monitor testing. This involves wearing a device that clips to your clothing and monitors the electrical activity of your heart over longer periods of time. ? Stress tests to see how exercise affects your heart rhythm and blood supply. ? Echocardiogram. This test uses sound waves (ultrasound) to produce an image of your heart. ? Electrophysiology study. This test checks the electric pathways in your heart.  How is this treated? Treatment depends on any  underlying conditions, the type of PVCs that you are having, and how much the symptoms are interfering with your daily life. Possible treatments include:  Avoiding things that can trigger the premature contractions, such as caffeine or alcohol.  Medicines. These may be given if symptoms are severe or if the extra heartbeats are frequent.  Treatment for any underlying condition that is found to be the cause of the contractions.  Catheter ablation. This procedure destroys the heart tissues that send abnormal signals.  In some cases, no treatment is required. Follow these instructions at home: Lifestyle Follow these instructions as told by your health care provider:  Do not use any products that contain nicotine or tobacco, such as cigarettes and e-cigarettes. If you need help quitting, ask your health care provider.  If caffeine triggers episodes of PVC, do not eat, drink, or use anything with caffeine in it.  If caffeine does not seem to trigger episodes, consume caffeine in moderation.  If alcohol triggers episodes of PVC, do not drink alcohol.  If alcohol does not seem to trigger episodes, limit alcohol intake to no more than 1 drink a day for nonpregnant women and 2 drinks a day for men. One  drink equals 12 oz of beer, 5 oz of wine, or 1 oz of hard liquor.  Exercise regularly. Ask your health care provider what type of exercise is safe for you.  Find healthy ways to manage stress. Avoid stressful situations when possible.  Try to get at least 7-9 hours of sleep each night, or as much as recommended by your health care provider.  Do not use illegal drugs.  General instructions  Take over-the-counter and prescription medicines only as told by your health care provider.  Keep all follow-up visits as told by your health care provider. This is important. Get help right away if:  You feel palpitations that are frequent or continual.  You have chest pain.  You have shortness  of breath.  You have sweating for no reason.  You have nausea and vomiting.  You become light-headed or you faint. This information is not intended to replace advice given to you by your health care provider. Make sure you discuss any questions you have with your health care provider. Document Released: 08/28/2003 Document Revised: 09/04/2015 Document Reviewed: 06/17/2015 Elsevier Interactive Patient Education  [employer].

## 2017-10-12 NOTE — Progress Notes (Signed)
Chief Complaint  Patient presents with  . Follow-up   F/u  1. Obesity weight is down 4 lbs since last visit  2. PAC/PVCs, bradycardia on toprol XL per Dr. Laurelyn Sickle pending 2nd opinion with Dr. Nehemiah Massed to see if needs to continue Toprol XL due to history and bradycardia pt will call to schedule  3. 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   Review of Systems  Constitutional: Positive for weight loss.  HENT: Negative for hearing loss.   Eyes: Negative for blurred vision.  Respiratory: Negative for shortness of breath.   Cardiovascular: Negative for chest pain.  Gastrointestinal: Negative for abdominal pain.  Skin: Negative for rash.  Neurological: Negative for headaches.  Psychiatric/Behavioral: Negative for depression.   Past Medical History:  Diagnosis Date  . Chicken pox   . GERD (gastroesophageal reflux disease)   . Hiatal hernia    small noted 2013   . Hyperlipidemia   . Kidney stone   . Premature atrial contractions   . Premature ventricular contraction   . Vitamin D deficiency   . Wrist fracture    right    Past Surgical History:  Procedure Laterality Date  . LASIK     2004   . TONSILLECTOMY AND ADENOIDECTOMY  1978   Family History  Problem Relation Age of Onset  . Cancer Mother        kidney cancer   . Kidney disease Mother   . Diabetes Mother   . Hearing loss Mother   . Heart disease Mother   . Cancer Father        prostate, colon   . Kidney disease Father   . Diabetes Father   . Hearing loss Father   . Stroke Maternal Grandmother   . Cancer Maternal Grandfather        bladder cancer, +smoker   . Alcohol abuse Maternal Grandfather    Social History   Socioeconomic History  . Marital status: Married    Spouse name: Not on file  . Number of children: Not on file  . Years of education: Not on file  . Highest education level: Not  on file  Occupational History  . Not on file  Social Needs  . Financial resource strain: Not on file  . Food insecurity:    Worry: Not on file    Inability: Not on file  . Transportation needs:    Medical: Not on file    Non-medical: Not on file  Tobacco Use  . Smoking status: Former Research scientist (life sciences)  . Smokeless tobacco: Former Systems developer    Types: Chew  Substance and Sexual Activity  . Alcohol use: No  . Drug use: No  . Sexual activity: Yes  Lifestyle  . Physical activity:    Days per week: Not on file    Minutes per session: Not on file  . Stress: Not on file  Relationships  . Social connections:    Talks on phone: Not on file    Gets together: Not on file    Attends religious service: Not on file    Active member of club or organization: Not on file    Attends meetings of clubs or organizations: Not on file    Relationship status: Not on file  . Intimate partner violence:    Fear of current or ex partner:  Not on file    Emotionally abused: Not on file    Physically abused: Not on file    Forced sexual activity: Not on file  Other Topics Concern  . 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    Feels safe in relationship, wears seat belt, owns guns in home    Current Meds  Medication Sig  . Cholecalciferol (VITAMIN D3) 5000 units CAPS Take 1 capsule by mouth daily.  . fluticasone (FLONASE) 50 MCG/ACT nasal spray Place 2 sprays into both nostrils daily.  . metoprolol succinate (TOPROL-XL) 25 MG 24 hr tablet Take 1 tablet (25 mg total) by mouth daily.  . Omega-3 Fatty Acids (FISH OIL) 1000 MG CAPS Take 520 mg by mouth 4 (four) times daily.   . pantoprazole (PROTONIX) 20 MG tablet Take 1 tablet (20 mg total) by mouth every morning. 30 minutes before food  . UNABLE TO FIND Med Name: Tumeric   No Known Allergies No results found for this or any previous visit (from the past 2160  hour(s)). Objective  Body mass index is 39.82 kg/m. Wt Readings from Last 3 Encounters:  10/12/17 293 lb 9.6 oz (133.2 kg)  06/09/17 297 lb 8 oz (134.9 kg)  03/09/17 300 lb (136.1 kg)   Temp Readings from Last 3 Encounters:  10/12/17 98.7 F (37.1 C) (Oral)  06/09/17 98.2 F (36.8 C) (Oral)  03/09/17 98 F (36.7 C) (Oral)   BP Readings from Last 3 Encounters:  10/12/17 112/72  06/09/17 110/74  03/09/17 112/78   Pulse Readings from Last 3 Encounters:  10/12/17 61  06/09/17 (!) 57  03/09/17 (!) 48    Physical Exam  Constitutional: He is oriented to person, place, and time. Vital signs are normal. He appears well-developed and well-nourished. He is cooperative.  HENT:  Head: Normocephalic and atraumatic.  Mouth/Throat: Oropharynx is clear and moist.  Eyes: Pupils are equal, round, and reactive to light. Conjunctivae are normal.  Cardiovascular: Normal rate, regular rhythm and normal heart sounds.  Pulmonary/Chest: Effort normal and breath sounds normal.  Neurological: He is alert and oriented to person, place, and time. Gait normal.  Skin: Skin is warm and dry. No rash noted.     Psychiatric: He has a normal mood and affect. His speech is normal and behavior is normal. Judgment and thought content normal. Cognition and memory are normal.  Nursing note and vitals reviewed.   Assessment   1. Obesity 39.82 2. PACs/PVCs, sinus bradycardia  3. Fatigue  4. HM 5. HLD Plan   1.  Continue exercise and healthy diet choices  2.  rec sch f/u Dr. Nehemiah Massed 2nd opinion  3. Check BMET and other labs  4.  Declines flu shot  Check MMR, BMET, testosterone, lipid  Tdap had 06/05/16  Had 2/3 hep B vaccines 12/10/17 FH prostate cancer in dad last PSA 0.8 02/15/16  Testosterone level will check    Former smoker quit 15-20 years ago smoker x 10 years 1/2 ppd max 1 ppd no FH lung cancer Former chew  Congratulated on quitting both   5.  Lipid  Provider: Dr. Olivia Mackie  [facility]

## 2017-10-26 ENCOUNTER — Other Ambulatory Visit: Payer: BLUE CROSS/BLUE SHIELD

## 2017-11-02 ENCOUNTER — Other Ambulatory Visit (INDEPENDENT_AMBULATORY_CARE_PROVIDER_SITE_OTHER): Payer: BLUE CROSS/BLUE SHIELD

## 2017-11-02 DIAGNOSIS — E785 Hyperlipidemia, unspecified: Secondary | ICD-10-CM

## 2017-11-02 DIAGNOSIS — Z1159 Encounter for screening for other viral diseases: Secondary | ICD-10-CM

## 2017-11-02 DIAGNOSIS — I491 Atrial premature depolarization: Secondary | ICD-10-CM | POA: Diagnosis not present

## 2017-11-02 DIAGNOSIS — E291 Testicular hypofunction: Secondary | ICD-10-CM

## 2017-11-02 DIAGNOSIS — I493 Ventricular premature depolarization: Secondary | ICD-10-CM

## 2017-11-02 DIAGNOSIS — Z0184 Encounter for antibody response examination: Secondary | ICD-10-CM

## 2017-11-02 NOTE — Addendum Note (Signed)
Addended by: Arby Barrette on: 11/02/2017 08:16 AM   Modules accepted: Orders

## 2017-11-03 ENCOUNTER — Other Ambulatory Visit: Payer: Self-pay | Admitting: Internal Medicine

## 2017-11-03 ENCOUNTER — Encounter: Payer: Self-pay | Admitting: Internal Medicine

## 2017-11-03 DIAGNOSIS — E559 Vitamin D deficiency, unspecified: Secondary | ICD-10-CM

## 2017-11-06 ENCOUNTER — Encounter: Payer: Self-pay | Admitting: *Deleted

## 2017-11-06 LAB — TESTOSTERONE, FREE & TOTAL
Free Testosterone: 76.5 pg/mL (ref 35.0–155.0)
Testosterone, Total, LC-MS-MS: 437 ng/dL (ref 250–1100)

## 2017-11-06 LAB — MEASLES/MUMPS/RUBELLA IMMUNITY
Mumps IgG: 33.1 AU/mL
Rubella: >33 index

## 2017-11-06 LAB — BASIC METABOLIC PANEL
BUN: 12 mg/dL (ref 7–25)
CALCIUM: 9.7 mg/dL (ref 8.6–10.3)
CO2: 25 mmol/L (ref 20–32)
Chloride: 103 mmol/L (ref 98–110)
Creat: 0.93 mg/dL (ref 0.60–1.35)
Glucose, Bld: 89 mg/dL (ref 65–99)
POTASSIUM: 4.3 mmol/L (ref 3.5–5.3)
SODIUM: 140 mmol/L (ref 135–146)

## 2017-11-06 LAB — LIPID PANEL
Cholesterol: 198 mg/dL (ref <200)
HDL: 31 mg/dL — ABNORMAL LOW (ref >40)
LDL Cholesterol (Calc): 133 mg/dL (calc) — ABNORMAL HIGH
Non-HDL Cholesterol (Calc): 167 mg/dL (calc) — ABNORMAL HIGH (ref <130)
Total CHOL/HDL Ratio: 6.4 (calc) — ABNORMAL HIGH (ref <5.0)
Triglycerides: 207 mg/dL — ABNORMAL HIGH (ref <150)

## 2017-11-22 ENCOUNTER — Telehealth: Payer: Self-pay | Admitting: Family Medicine

## 2017-11-22 NOTE — Telephone Encounter (Signed)
Spoke to pt, scheduled him Thursday 11.7.19 @ 1:30pm.

## 2017-11-22 NOTE — Telephone Encounter (Signed)
Copied from [place] [id number]. Topic: Appointment Scheduling - Scheduling Inquiry for Clinic >> Nov 22, 2017 11:08 AM Lennox Solders wrote: Reason for CRM: pt is calling and would like to see dr Tamala Julian. Pt was offered to see another provider and really really want to see dr Tamala Julian for lower right side back pain. Pt has been having back pain about a week. Pt has see dr Tamala Julian in past

## 2017-11-30 ENCOUNTER — Ambulatory Visit: Payer: BLUE CROSS/BLUE SHIELD | Admitting: Family Medicine

## 2017-12-03 ENCOUNTER — Encounter: Payer: Self-pay | Admitting: Internal Medicine

## 2017-12-06 DIAGNOSIS — H52223 Regular astigmatism, bilateral: Secondary | ICD-10-CM | POA: Diagnosis not present

## 2017-12-08 ENCOUNTER — Encounter: Payer: Self-pay | Admitting: Internal Medicine

## 2017-12-12 ENCOUNTER — Ambulatory Visit: Payer: BLUE CROSS/BLUE SHIELD

## 2017-12-12 NOTE — Progress Notes (Signed)
[name] [address] [place], [place] [address] Phone: [phone] Subjective:    I [name] am serving as a Education administrator for Dr. Hulan Saas.   CC: Low back pain  IRW:[id number]  [name] is a 47 y.o. male coming in with complaint of back pain. No numbness and tingling noted. Wants OMT.  Onset- Chronic Location- Lower right sided back pain Duration- Consistent pain all day Character- Achy Aggravating factors-  Reliving factors-  Therapies tried-  Severity-     Past Medical History:  Diagnosis Date  . Chicken pox   . GERD (gastroesophageal reflux disease)   . Hiatal hernia    small noted 2013   . Hyperlipidemia   . Kidney stone   . Premature atrial contractions   . Premature ventricular contraction   . Vitamin D deficiency   . Wrist fracture    right    Past Surgical History:  Procedure Laterality Date  . LASIK     2004   . TONSILLECTOMY AND ADENOIDECTOMY  1978   Social History   Socioeconomic History  . Marital status: Married    Spouse name: Not on file  . Number of children: Not on file  . Years of education: Not on file  . Highest education level: Not on file  Occupational History  . Not on file  Social Needs  . Financial resource strain: Not on file  . Food insecurity:    Worry: Not on file    Inability: Not on file  . Transportation needs:    Medical: Not on file    Non-medical: Not on file  Tobacco Use  . Smoking status: Former Research scientist (life sciences)  . Smokeless tobacco: Former Systems developer    Types: Chew  Substance and Sexual Activity  . Alcohol use: No  . Drug use: No  . Sexual activity: Yes  Lifestyle  . Physical activity:    Days per week: Not on file    Minutes per session: Not on file  . Stress: Not on file  Relationships  . Social connections:    Talks on phone: Not on file    Gets together: Not on file    Attends religious service: Not on file    Active member of club or organization: Not on file   Attends meetings of clubs or organizations: Not on file    Relationship status: Not on file  Other Topics Concern  . 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    Feels safe in relationship, wears seat belt, owns guns in home    No Known Allergies Family History  Problem Relation Age of Onset  . Cancer Mother        kidney cancer   . Kidney disease Mother   . Diabetes Mother   . Hearing loss Mother   . Heart disease Mother   . Cancer Father        prostate, colon   . Kidney disease Father   . Diabetes Father   . Hearing loss Father   . Stroke Maternal Grandmother   . Cancer Maternal Grandfather        bladder cancer, +smoker   . Alcohol abuse Maternal Grandfather      Current Outpatient Medications (Cardiovascular):  .  metoprolol succinate (TOPROL-XL) 25 MG 24 hr tablet, Take 1 tablet (25 mg  total) by mouth daily.  Current Outpatient Medications (Respiratory):  .  fluticasone (FLONASE) 50 MCG/ACT nasal spray, Place 2 sprays into both nostrils daily.    Current Outpatient Medications (Other):  Marland Kitchen  Cholecalciferol (VITAMIN D3) 5000 units CAPS, Take 1 capsule by mouth daily. .  Omega-3 Fatty Acids (FISH OIL) 1000 MG CAPS, Take 520 mg by mouth 4 (four) times daily.  .  pantoprazole (PROTONIX) 20 MG tablet, Take 1 tablet (20 mg total) by mouth every morning. 30 minutes before food .  UNABLE TO FIND, Med Name: Tumeric    Past medical history, social, surgical and family history all reviewed in electronic medical record.  No pertanent information unless stated regarding to the chief complaint.   Review of Systems:  No headache, visual changes, nausea, vomiting, diarrhea, constipation, dizziness, abdominal pain, skin rash, fevers, chills, night sweats, weight loss, swollen lymph nodes, body aches, joint swelling, chest pain, shortness of breath, mood changes.  Positive muscle  aches  Objective  Blood pressure 130/80, pulse 97, height 6' (1.829 m), weight 290 lb (131.5 kg), SpO2 (!) 74 %.    General: No apparent distress alert and oriented x3 mood and affect normal, dressed appropriately.  HEENT: Pupils equal, extraocular movements intact  Respiratory: Patient's speak in full sentences and does not appear short of breath  Cardiovascular: No lower extremity edema, non tender, no erythema  Skin: Warm dry intact with no signs of infection or rash on extremities or on axial skeleton.  Abdomen: Soft nontender  Neuro: Cranial nerves II through XII are intact, neurovascularly intact in all extremities with 2+ DTRs and 2+ pulses.  Lymph: No lymphadenopathy of posterior or anterior cervical chain or axillae bilaterally.  Gait normal with good balance and coordination.  MSK:  Non tender with full range of motion and good stability and symmetric strength and tone of shoulders, elbows, wrist, hip, knee and ankles bilaterally.  Back Exam:  Inspection: Mild loss of lordosis Motion: Flexion 45 deg, Extension 25 deg, Side Bending to 45 deg bilaterally,  Rotation to 45 deg bilaterally  SLR laying: Negative  XSLR laying: Negative  Palpable tenderness: Tender to palpation the paraspinal musculature on the right side.Marland Kitchen FABER: Tightness bilaterally. Sensory change: Gross sensation intact to all lumbar and sacral dermatomes.  Reflexes: 2+ at both patellar tendons, 2+ at achilles tendons, Babinski's downgoing.  Strength at foot  Plantar-flexion: 5/5 Dorsi-flexion: 5/5 Eversion: 5/5 Inversion: 5/5  Leg strength  Quad: 5/5 Hamstring: 5/5 Hip flexor: 5/5 Hip abductors: 4/5  Gait unremarkable.  Osteopathic findings  C2 flexed rotated and side bent right T3 extended rotated and side bent right inhaled rib T8 extended rotated and side bent left L3 flexed rotated and side bent right L5 F RS right  Sacrum right on right     Impression and Recommendations:     This case  required medical decision making of moderate complexity. The above documentation has been reviewed and is accurate and complete Lyndal Pulley, DO       Note: This dictation was prepared with Dragon dictation along with smaller phrase technology. Any transcriptional errors that result from this process are unintentional.

## 2017-12-13 ENCOUNTER — Ambulatory Visit: Payer: BLUE CROSS/BLUE SHIELD | Admitting: Family Medicine

## 2017-12-13 ENCOUNTER — Encounter: Payer: Self-pay | Admitting: Family Medicine

## 2017-12-13 VITALS — BP 130/80 | HR 97 | Ht 72.0 in | Wt 290.0 lb

## 2017-12-13 DIAGNOSIS — M545 Low back pain, unspecified: Secondary | ICD-10-CM | POA: Insufficient documentation

## 2017-12-13 DIAGNOSIS — G8929 Other chronic pain: Secondary | ICD-10-CM

## 2017-12-13 DIAGNOSIS — M999 Biomechanical lesion, unspecified: Secondary | ICD-10-CM

## 2017-12-13 NOTE — Assessment & Plan Note (Addendum)
Chronic back pain that seems to be multifactorial and more muscle imbalances.  We discussed icing regimen and home exercises.  We discussed which activities to do which wants to avoid.  Patient is to increase activity slowly over the course the next several days. Responded well to manipulation.  Follow-up again in 4 weeks

## 2017-12-13 NOTE — Patient Instructions (Signed)
Good to see you  Ice is yoru friend Exercises 3 times a week.  Tart cherry extract in pill form any dose at night Stay active Call [phone] when you need [place] See me again in 4 weeks if not perfect !

## 2017-12-13 NOTE — Assessment & Plan Note (Signed)
Decision today to treat with OMT was based on Physical Exam  After verbal consent patient was treated with HVLA, ME, FPR techniques in cervical, thoracic, lumbar and sacral areas  Patient tolerated the procedure well with improvement in symptoms  Patient given exercises, stretches and lifestyle modifications  See medications in patient instructions if given  Patient will follow up in 4-6 weeks 

## 2018-01-03 ENCOUNTER — Encounter: Payer: Self-pay | Admitting: Internal Medicine

## 2018-01-03 ENCOUNTER — Ambulatory Visit: Payer: BLUE CROSS/BLUE SHIELD | Admitting: Internal Medicine

## 2018-01-03 VITALS — BP 112/78 | HR 57 | Temp 98.4°F | Ht 72.0 in | Wt 292.0 lb

## 2018-01-03 DIAGNOSIS — R002 Palpitations: Secondary | ICD-10-CM

## 2018-01-03 DIAGNOSIS — I493 Ventricular premature depolarization: Secondary | ICD-10-CM

## 2018-01-03 DIAGNOSIS — I491 Atrial premature depolarization: Secondary | ICD-10-CM | POA: Diagnosis not present

## 2018-01-03 DIAGNOSIS — R001 Bradycardia, unspecified: Secondary | ICD-10-CM | POA: Insufficient documentation

## 2018-01-03 DIAGNOSIS — E785 Hyperlipidemia, unspecified: Secondary | ICD-10-CM

## 2018-01-03 DIAGNOSIS — R42 Dizziness and giddiness: Secondary | ICD-10-CM | POA: Diagnosis not present

## 2018-01-03 MED ORDER — MECLIZINE HCL 12.5 MG PO TABS: 12.5000 mg | ORAL_TABLET | Freq: Two times a day (BID) | ORAL | 0 refills | Status: DC | PRN

## 2018-01-03 MED ORDER — METOPROLOL SUCCINATE ER 25 MG PO TB24: 12.5000 mg | ORAL_TABLET | Freq: Every day | ORAL | 1 refills | Status: DC

## 2018-01-03 NOTE — Progress Notes (Signed)
Pre visit review using our clinic review tool, if applicable. No additional management support is needed unless otherwise documented below in the visit note. 

## 2018-01-03 NOTE — Progress Notes (Addendum)
Chief Complaint  Patient presents with  . Dizziness   F/u  1. Dizziness and lightheadedness since Thanskgiving resolved and then Thursday, Friday and Saturday sx's returned. Room is not spinning at times feels nauseated. His mother had problems with inner ear and he is concerned. Orthostatics negative today but HR is low lying 114/70 HR 49, sitting 112/70 HR 49, standing 108/68 HR 63. He is on Toprol XL 25 mg qd but he states he has never been dizzy on this medication  2. PACs,PVCS, bradycardia, dizziness, palpitations and h/o "leaky valve" per cardiology Dr. Ceasar Mons per pt. Referred for 2nd opinion Dr. Nehemiah Massed 03/09/17 pt did not schedule he is willing to schedule appt now given # to call and make appt today  3. HLD disc healthy diet choices today and given cholesterol info   Review of Systems  Constitutional: Negative for weight loss.  HENT: Negative for ear pain and hearing loss.   Eyes: Negative for blurred vision.  Respiratory: Negative for shortness of breath.   Cardiovascular: Negative for chest pain.  Gastrointestinal: Positive for nausea.  Musculoskeletal: Negative for falls.  Skin: Negative for rash.  Neurological: Positive for dizziness.  Psychiatric/Behavioral: Negative for depression.   Past Medical History:  Diagnosis Date  . Bradycardia   . Chicken pox   . GERD (gastroesophageal reflux disease)   . Hiatal hernia    small noted 2013   . Hyperlipidemia   . Kidney stone   . Premature atrial contractions   . Premature ventricular contraction   . Vitamin D deficiency   . Wrist fracture    right    Past Surgical History:  Procedure Laterality Date  . LASIK     2004   . TONSILLECTOMY AND ADENOIDECTOMY  1978   Family History  Problem Relation Age of Onset  . Cancer Mother        kidney cancer   . Kidney disease Mother   . Diabetes Mother   . Hearing loss Mother   . Heart disease Mother   . Cancer Father        prostate, colon   . Kidney disease Father    . Diabetes Father   . Hearing loss Father   . Stroke Maternal Grandmother   . Cancer Maternal Grandfather        bladder cancer, +smoker   . Alcohol abuse Maternal Grandfather    Social History   Socioeconomic History  . Marital status: Married    Spouse name: Not on file  . Number of children: Not on file  . Years of education: Not on file  . Highest education level: Not on file  Occupational History  . Not on file  Social Needs  . Financial resource strain: Not on file  . Food insecurity:    Worry: Not on file    Inability: Not on file  . Transportation needs:    Medical: Not on file    Non-medical: Not on file  Tobacco Use  . Smoking status: Former Research scientist (life sciences)  . Smokeless tobacco: Former Systems developer    Types: Chew  Substance and Sexual Activity  . Alcohol use: No  . Drug use: No  . Sexual activity: Yes  Lifestyle  . Physical activity:    Days per week: Not on file    Minutes per session: Not on file  . Stress: Not on file  Relationships  . Social connections:    Talks on phone: Not on file    Gets together:  Not on file    Attends religious service: Not on file    Active member of club or organization: Not on file    Attends meetings of clubs or organizations: Not on file    Relationship status: Not on file  . Intimate partner violence:    Fear of current or ex partner: Not on file    Emotionally abused: Not on file    Physically abused: Not on file    Forced sexual activity: Not on file  Other Topics Concern  . Not on file  Social History Narrative   Married Ranulfo Kall    1 daughter    [employer] Maintenance [employer]   HS/Community college ed.    Feels safe in relationship, wears seat belt, owns guns in home    Current Meds  Medication Sig  . Cholecalciferol (VITAMIN D3) 5000 units CAPS Take 1 capsule by mouth daily.  . fluticasone (FLONASE) 50 MCG/ACT nasal spray Place 2 sprays into both nostrils daily.  . metoprolol succinate (TOPROL-XL) 25 MG 24 hr  tablet Take 1 tablet (25 mg total) by mouth daily.  . Omega-3 Fatty Acids (FISH OIL) 1000 MG CAPS Take 520 mg by mouth 4 (four) times daily.   . pantoprazole (PROTONIX) 20 MG tablet Take 1 tablet (20 mg total) by mouth every morning. 30 minutes before food  . UNABLE TO FIND Med Name: Tumeric   No Known Allergies Recent Results (from the past 2160 hour(s))  Basic Metabolic Panel (BMET)     Status: None   Collection Time: 11/02/17  8:17 AM  Result Value Ref Range   Glucose, Bld 89 65 - 99 mg/dL    Comment: .            Fasting reference interval .    BUN 12 7 - 25 mg/dL   Creat 0.93 0.60 - 1.35 mg/dL   BUN/Creatinine Ratio NOT APPLICABLE 6 - 22 (calc)   Sodium 140 135 - 146 mmol/L   Potassium 4.3 3.5 - 5.3 mmol/L   Chloride 103 98 - 110 mmol/L   CO2 25 20 - 32 mmol/L   Calcium 9.7 8.6 - 10.3 mg/dL  Lipid panel     Status: Abnormal   Collection Time: 11/02/17  8:17 AM  Result Value Ref Range   Cholesterol 198 <200 mg/dL   HDL 31 (L) >40 mg/dL   Triglycerides 207 (H) <150 mg/dL    Comment: . If a non-fasting specimen was collected, consider repeat triglyceride testing on a fasting specimen if clinically indicated.  Yates Decamp et al. J. of Clin. Lipidol. 5852;7:782-423. Marland Kitchen    LDL Cholesterol (Calc) 133 (H) mg/dL (calc)    Comment: Reference range: <100 . Desirable range <100 mg/dL for primary prevention;   <70 mg/dL for patients with CHD or diabetic patients  with > or = 2 CHD risk factors. Marland Kitchen LDL-C is now calculated using the Martin-Hopkins  calculation, which is a validated novel method providing  better accuracy than the Friedewald equation in the  estimation of LDL-C.  Cresenciano Genre et al. Annamaria Helling. 5361;443(15): 2061-2068  (http://education.[facility].com/faq/FAQ164)    Total CHOL/HDL Ratio 6.4 (H) <5.0 (calc)   Non-HDL Cholesterol (Calc) 167 (H) <130 mg/dL (calc)    Comment: For patients with diabetes plus 1 major ASCVD risk  factor, treating to a non-HDL-C goal of  <100 mg/dL  (LDL-C of <70 mg/dL) is considered a therapeutic  option.   Testosterone , Free and Total     Status: None  Collection Time: 11/02/17  8:17 AM  Result Value Ref Range   Testosterone, Total, LC-MS-MS 437 250 - 1,100 ng/dL    Comment: . For additional information, please refer to http://education.[facility].com/faq/ TotalTestosteroneLCMSMSFAQ165 (This link is being provided for informational/ educational purposes only.) . This test was developed and its analytical performance characteristics have been determined by [place], [place]. It has not been cleared or approved by the U.S. Food and Drug Administration. This assay has been validated pursuant to the CLIA regulations and is used for clinical purposes. .    Free Testosterone 76.5 35.0 - 155.0 pg/mL    Comment: . This test was developed and its analytical performance characteristics have been determined by [place], [place]. It has not been cleared or approved by the U.S. Food and Drug Administration. This assay has been validated pursuant to the CLIA regulations and is used for clinical purposes. .   Measles/Mumps/Rubella Immunity     Status: Abnormal   Collection Time: 11/02/17  8:17 AM  Result Value Ref Range   Rubeola IgG <25.00 (L) AU/mL    Comment: AU/mL            Interpretation -----            -------------- <25.00           Negative 25.00-29.99      Equivocal >29.99           Positive . A positive result indicates that the patient has antibody to measles virus. It does not differentiate  between an active or past infection. The clinical  diagnosis must be interpreted in conjunction with  clinical signs and symptoms of the patient.    Mumps IgG 33.10 AU/mL    Comment:  AU/mL           Interpretation -------         ---------------- <9.00             Negative 9.00-10.99        Equivocal >10.99            Positive A  positive result indicates that the patient has  antibody to mumps virus. It does not differentiate between an  active or past infection. The clinical diagnosis must be interpreted in conjunction with clinical signs and symptoms of the patient. .    Rubella >33.00 index    Comment:     Index            Interpretation     -----            --------------       <0.90            Not consistent with Immunity     0.90-0.99        Equivocal     > or = 1.00      Consistent with Immunity  . The presence of rubella IgG antibody suggests  immunization or past or current infection with rubella virus.    Objective  Body mass index is 39.6 kg/m. Wt Readings from Last 3 Encounters:  01/03/18 292 lb (132.5 kg)  12/13/17 290 lb (131.5 kg)  10/12/17 293 lb 9.6 oz (133.2 kg)   Temp Readings from Last 3 Encounters:  01/03/18 98.4 F (36.9 C) (Oral)  10/12/17 98.7 F (37.1 C) (Oral)  06/09/17 98.2 F (36.8 C) (Oral)   BP Readings from Last 3 Encounters:  01/03/18 112/78  12/13/17 130/80  10/12/17 112/72   Pulse Readings from Last 3 Encounters:  01/03/18 (!) 57  12/13/17 97  10/12/17 61    Physical Exam  Constitutional: He is oriented to person, place, and time. Vital signs are normal. He appears well-developed and well-nourished. He is cooperative.  HENT:  Head: Normocephalic and atraumatic.  Mouth/Throat: Oropharynx is clear and moist and mucous membranes are normal.  Eyes: Pupils are equal, round, and reactive to light. Conjunctivae are normal.  Cardiovascular: Regular rhythm and normal heart sounds. Bradycardia present.  Pulmonary/Chest: Effort normal and breath sounds normal.  Neurological: He is alert and oriented to person, place, and time. Gait normal.  Skin: Skin is warm, dry and intact.  Psychiatric: He has a normal mood and affect. His speech is normal and behavior is normal. Judgment and thought content normal. Cognition and memory are normal.  Nursing note and vitals  reviewed.   Assessment   1. Dizziness orthostatics negative today ddx vertigo, bradycardia 2. PACs, PVCs, bradycardia, palpitations, possibly valvular issues with heart per pt (per former cards Dr. Dan Europe) and now with dizziness  3. HLD  4. HM Plan   1. Trial of meclizine reduce toprol XL to 1/2 of 25 mg pill qd due to bradycardia  Consider ENT if not better, +/-vestibular rehab  2. Refer again to Dr. Nehemiah Massed rec pt keep appt consider repeat holter and echo 3. Given cholesterol handout monitor  4.  Declines flu shot  rec mmr vaccine in future Tdap had 06/05/16  Had 2/3 hep B vaccines, needs 3rd vaccine was due 12/10/17 FH prostate cancer in dad last PSA 0.89 05/29/17 Testosterone normal 11/02/17   Former smoker quit 15-20 years ago smoker x 10 years 1/2 ppd max 1 ppd no FH lung cancer Former chew  -Congratulated on quitting both   EKG 01/23/18 Dr. Raliegh Ip cards normal saw brady/palp no w/u rec no med recs, control HLD   Provider: Dr. Olivia Mackie McLean-Scocuzza-Internal Medicine

## 2018-01-03 NOTE — Patient Instructions (Addendum)
Please schedule appt with cardiology  Try cut to Toprol in 1/2 pill daily   Call Dr. Raliegh Ip  Try Meclizine 12.5 mg as needed and if not better my chart me    Results for Roger [name], Roger [name] (MRN [id number]) as of 01/03/2018 14:23  Ref. Range 05/29/2017 08:02 11/02/2017 08:17  Cholesterol Latest Ref Range: <200 mg/dL 192 198  HDL Cholesterol Latest Ref Range: >40 mg/dL 33.60 (L) 31 (L)  LDL (calc) Latest Ref Range: 0 - 99 mg/dL 135 (H)   LDL Cholesterol (Calc) Latest Units: mg/dL (calc)  133 (H)  NonHDL Unknown 158.24   Non-HDL Cholesterol (Calc) Latest Ref Range: <130 mg/dL (calc)  167 (H)  Triglycerides Latest Ref Range: <150 mg/dL 118.0 207 (H)  VLDL Latest Ref Range: 0.0 - 40.0 mg/dL 23.6     Vertigo Vertigo is the feeling that you or your surroundings are moving when they are not. Vertigo can be dangerous if it occurs while you are doing something that could endanger you or others, such as driving. What are the causes? This condition is caused by a disturbance in the signals that are sent by your body's sensory systems to your brain. Different causes of a disturbance can lead to vertigo, including:  Infections, especially in the inner ear.  A bad reaction to a drug, or misuse of alcohol and medicines.  Withdrawal from drugs or alcohol.  Quickly changing positions, as when lying down or rolling over in bed.  Migraine headaches.  Decreased blood flow to the brain.  Decreased blood pressure.  Increased pressure in the brain from a head or neck injury, stroke, infection, tumor, or bleeding.  Central nervous system disorders.  What are the signs or symptoms? Symptoms of this condition usually occur when you move your head or your eyes in different directions. Symptoms may start suddenly, and they usually last for less than a minute. Symptoms may include:  Loss of balance and falling.  Feeling like you are spinning or moving.  Feeling like your surroundings are spinning or  moving.  Nausea and vomiting.  Blurred vision or double vision.  Difficulty hearing.  Slurred speech.  Dizziness.  Involuntary eye movement (nystagmus).  Symptoms can be mild and cause only slight annoyance, or they can be severe and interfere with daily life. Episodes of vertigo may return (recur) over time, and they are often triggered by certain movements. Symptoms may improve over time. How is this diagnosed? This condition may be diagnosed based on medical history and the quality of your nystagmus. Your health care provider may test your eye movements by asking you to quickly change positions to trigger the nystagmus. This may be called the Dix-Hallpike test, head thrust test, or roll test. You may be referred to a health care provider who specializes in ear, nose, and throat (ENT) problems (otolaryngologist) or a provider who specializes in disorders of the central nervous system (neurologist). You may have additional testing, including:  A physical exam.  Blood tests.  MRI.  A CT scan.  An electrocardiogram (ECG). This records electrical activity in your heart.  An electroencephalogram (EEG). This records electrical activity in your brain.  Hearing tests.  How is this treated? Treatment for this condition depends on the cause and the severity of the symptoms. Treatment options include:  Medicines to treat nausea or vertigo. These are usually used for severe cases. Some medicines that are used to treat other conditions may also reduce or eliminate vertigo symptoms. These include: ? Medicines  that control allergies (antihistamines). ? Medicines that control seizures (anticonvulsants). ? Medicines that relieve depression (antidepressants). ? Medicines that relieve anxiety (sedatives).  Head movements to adjust your inner ear back to normal. If your vertigo is caused by an ear problem, your health care provider may recommend certain movements to correct the  problem.  Surgery. This is rare.  Follow these instructions at home: Safety  Move slowly.Avoid sudden body or head movements.  Avoid driving.  Avoid operating heavy machinery.  Avoid doing any tasks that would cause danger to you or others if you would have a vertigo episode during the task.  If you have trouble walking or keeping your balance, try using a cane for stability. If you feel dizzy or unstable, sit down right away.  Return to your normal activities as told by your health care provider. Ask your health care provider what activities are safe for you. General instructions  Take over-the-counter and prescription medicines only as told by your health care provider.  Avoid certain positions or movements as told by your health care provider.  Drink enough fluid to keep your urine clear or pale yellow.  Keep all follow-up visits as told by your health care provider. This is important. Contact a health care provider if:  Your medicines do not relieve your vertigo or they make it worse.  You have a fever.  Your condition gets worse or you develop new symptoms.  Your family or friends notice any behavioral changes.  Your nausea or vomiting gets worse.  You have numbness or a "pins and needles" sensation in part of your body. Get help right away if:  You have difficulty moving or speaking.  You are always dizzy.  You faint.  You develop severe headaches.  You have weakness in your hands, arms, or legs.  You have changes in your hearing or vision.  You develop a stiff neck.  You develop sensitivity to light. This information is not intended to replace advice given to you by your health care provider. Make sure you discuss any questions you have with your health care provider. Document Released: 10/20/2004 Document Revised: 06/24/2015 Document Reviewed: 05/05/2014 Elsevier Interactive Patient Education  2018 [employer].  Bradycardia, Adult Bradycardia is  a slower-than-normal heartbeat. A normal resting heart rate for an adult ranges from 60 to 100 beats per minute. With bradycardia, the resting heart rate is less than 60 beats per minute. Bradycardia can prevent enough oxygen from reaching certain areas of your body when you are active. It can be serious if it keeps enough oxygen from reaching your brain and other parts of your body. Bradycardia is not a problem for everyone. For some healthy adults, a slow resting heart rate is normal. What are the causes? This condition may be caused by:  A problem with the heart, including: ? A problem with the heart's electrical system, such as a heart block. ? A problem with the heart's natural pacemaker (sinus node). ? Heart disease. ? A heart attack. ? Heart damage. ? A heart infection. ? A heart condition that is present at birth (congenital heart defect).  Certain medicines that treat heart conditions.  Certain conditions, such as hypothyroidism and obstructive sleep apnea.  Problems with the balance of chemicals and other substances, like potassium, in the blood.  What increases the risk? This condition is more likely to develop in adults who:  Are age 59 or older.  Have high blood pressure (hypertension), high cholesterol (hyperlipidemia), or diabetes.  Drink heavily, use tobacco or nicotine products, or use drugs.  Are stressed.  What are the signs or symptoms? Symptoms of this condition include:  Light-headedness.  Feeling faint or fainting.  Fatigue and weakness.  Shortness of breath.  Chest pain (angina).  Drowsiness.  Confusion.  Dizziness.  How is this diagnosed? This condition may be diagnosed based on:  Your symptoms.  Your medical history.  A physical exam.  During the exam, your health care provider will listen to your heartbeat and check your pulse. To confirm the diagnosis, your health care provider may order tests, such as:  Blood tests.  An  electrocardiogram (ECG). This test records the heart's electrical activity. The test can show how fast your heart is beating and whether the heartbeat is steady.  A test in which you wear a portable device (event recorder or Holter monitor) to record your heart's electrical activity while you go about your day.  Anexercise test.  How is this treated? Treatment for this condition depends on the cause of the condition and how severe your symptoms are. Treatment may involve:  Treatment of the underlying condition.  Changing your medicines or how much medicine you take.  Having a small, battery-operated device called a pacemaker implanted under the skin. When bradycardia occurs, this device can be used to increase your heart rate and help your heart to beat in a regular rhythm.  Follow these instructions at home: Lifestyle   Manage any health conditions that contribute to bradycardia as told by your health care provider.  Follow a heart-healthy diet. A nutrition specialist (dietitian) can help to educate you about healthy food options and changes.  Follow an exercise program that is approved by your health care provider.  Maintain a healthy weight.  Try to reduce or manage your stress, such as with yoga or meditation. If you need help reducing stress, ask your health care provider.  Do not use use any products that contain nicotine or tobacco, such as cigarettes and e-cigarettes. If you need help quitting, ask your health care provider.  Do not use illegal drugs.  Limit alcohol intake to no more than 1 drink per day for nonpregnant women and 2 drinks per day for men. One drink equals 12 oz of beer, 5 oz of wine, or 1 oz of hard liquor. General instructions  Take over-the-counter and prescription medicines only as told by your health care provider.  Keep all follow-up visits as directed by your health care provider. This is important. How is this prevented? In some cases,  bradycardia may be prevented by:  Treating underlying medical problems.  Stopping behaviors or medicines that can trigger the condition.  Contact a health care provider if:  You feel light-headed or dizzy.  You almost faint.  You feel weak or are easily fatigued during physical activity.  You experience confusion or have memory problems. Get help right away if:  You faint.  You have an irregular heartbeat (palpitations).  You have chest pain.  You have trouble breathing. This information is not intended to replace advice given to you by your health care provider. Make sure you discuss any questions you have with your health care provider. Document Released: 10/02/2001 Document Revised: 09/08/2015 Document Reviewed: 07/02/2015 Elsevier Interactive Patient Education  2017 [employer]  Dizziness Dizziness is a common problem. It is a feeling of unsteadiness or light-headedness. You may feel like you are about to faint. Dizziness can lead to injury if you stumble or fall.  Anyone can become dizzy, but dizziness is more common in older adults. This condition can be caused by a number of things, including medicines, dehydration, or illness. Follow these instructions at home: Eating and drinking  Drink enough fluid to keep your urine clear or pale yellow. This helps to keep you from becoming dehydrated. Try to drink more clear fluids, such as water.  Do not drink alcohol.  Limit your caffeine intake if told to do so by your health care provider. Check ingredients and nutrition facts to see if a food or beverage contains caffeine.  Limit your salt (sodium) intake if told to do so by your health care provider. Check ingredients and nutrition facts to see if a food or beverage contains sodium. Activity  Avoid making quick movements. ? Rise slowly from chairs and steady yourself until you feel okay. ? In the morning, first sit up on the side of the bed. When you feel okay, stand  slowly while you hold onto something until you know that your balance is fine.  If you need to stand in one place for a long time, move your legs often. Tighten and relax the muscles in your legs while you are standing.  Do not drive or use heavy machinery if you feel dizzy.  Avoid bending down if you feel dizzy. Place items in your home so that they are easy for you to reach without leaning over. Lifestyle  Do not use any products that contain nicotine or tobacco, such as cigarettes and e-cigarettes. If you need help quitting, ask your health care provider.  Try to reduce your stress level by using methods such as yoga or meditation. Talk with your health care provider if you need help to manage your stress. General instructions  Watch your dizziness for any changes.  Take over-the-counter and prescription medicines only as told by your health care provider. Talk with your health care provider if you think that your dizziness is caused by a medicine that you are taking.  Tell a friend or a family member that you are feeling dizzy. If he or she notices any changes in your behavior, have this person call your health care provider.  Keep all follow-up visits as told by your health care provider. This is important. Contact a health care provider if:  Your dizziness does not go away.  Your dizziness or light-headedness gets worse.  You feel nauseous.  You have reduced hearing.  You have new symptoms.  You are unsteady on your feet or you feel like the room is spinning. Get help right away if:  You vomit or have diarrhea and are unable to eat or drink anything.  You have problems talking, walking, swallowing, or using your arms, hands, or legs.  You feel generally weak.  You are not thinking clearly or you have trouble forming sentences. It may take a friend or family member to notice this.  You have chest pain, abdominal pain, shortness of breath, or sweating.  Your vision  changes.  You have any bleeding.  You have a severe headache.  You have neck pain or a stiff neck.  You have a fever. These symptoms may represent a serious problem that is an emergency. Do not wait to see if the symptoms will go away. Get medical help right away. Call your local emergency services (911 in the [place] Do not drive yourself to the hospital. Summary  Dizziness is a feeling of unsteadiness or light-headedness. This condition can be  caused by a number of things, including medicines, dehydration, or illness.  Anyone can become dizzy, but dizziness is more common in older adults.  Drink enough fluid to keep your urine clear or pale yellow. Do not drink alcohol.  Avoid making quick movements if you feel dizzy. Monitor your dizziness for any changes. This information is not intended to replace advice given to you by your health care provider. Make sure you discuss any questions you have with your health care provider. Document Released: 07/06/2000 Document Revised: 02/13/2016 Document Reviewed: 02/13/2016 Elsevier Interactive Patient Education  [employer].

## 2018-01-09 ENCOUNTER — Ambulatory Visit: Payer: BLUE CROSS/BLUE SHIELD | Admitting: Family Medicine

## 2018-01-23 DIAGNOSIS — R002 Palpitations: Secondary | ICD-10-CM | POA: Diagnosis not present

## 2018-01-23 DIAGNOSIS — E782 Mixed hyperlipidemia: Secondary | ICD-10-CM | POA: Diagnosis not present

## 2018-01-23 DIAGNOSIS — I10 Essential (primary) hypertension: Secondary | ICD-10-CM | POA: Diagnosis not present

## 2018-01-23 DIAGNOSIS — R001 Bradycardia, unspecified: Secondary | ICD-10-CM | POA: Diagnosis not present

## 2018-03-25 ENCOUNTER — Encounter: Payer: Self-pay | Admitting: Internal Medicine

## 2018-04-08 ENCOUNTER — Encounter: Payer: Self-pay | Admitting: Internal Medicine

## 2018-04-11 ENCOUNTER — Encounter: Payer: Self-pay | Admitting: Internal Medicine

## 2018-04-12 ENCOUNTER — Ambulatory Visit: Payer: BLUE CROSS/BLUE SHIELD | Admitting: Internal Medicine

## 2018-04-16 ENCOUNTER — Encounter: Payer: Self-pay | Admitting: Internal Medicine

## 2018-04-18 ENCOUNTER — Other Ambulatory Visit: Payer: Self-pay | Admitting: Internal Medicine

## 2018-04-18 DIAGNOSIS — M545 Low back pain, unspecified: Secondary | ICD-10-CM

## 2018-04-18 MED ORDER — TRAMADOL HCL 50 MG PO TABS: 50.0000 mg | ORAL_TABLET | Freq: Two times a day (BID) | ORAL | 0 refills | Status: DC | PRN

## 2018-04-30 ENCOUNTER — Encounter: Payer: Self-pay | Admitting: Internal Medicine

## 2018-05-06 ENCOUNTER — Encounter: Payer: Self-pay | Admitting: Internal Medicine

## 2018-05-08 ENCOUNTER — Encounter: Payer: Self-pay | Admitting: Internal Medicine

## 2018-05-14 ENCOUNTER — Encounter: Payer: Self-pay | Admitting: Internal Medicine

## 2018-05-15 ENCOUNTER — Encounter: Payer: Self-pay | Admitting: Internal Medicine

## 2018-05-23 ENCOUNTER — Telehealth: Payer: Self-pay | Admitting: Internal Medicine

## 2018-05-23 NOTE — Telephone Encounter (Signed)
How is his dads leg redness completely gone?   [place]

## 2018-05-23 NOTE — Telephone Encounter (Signed)
Mychart sent to ask patients son

## 2018-05-27 ENCOUNTER — Encounter: Payer: Self-pay | Admitting: Internal Medicine

## 2018-06-01 ENCOUNTER — Encounter: Payer: Self-pay | Admitting: Internal Medicine

## 2018-06-01 DIAGNOSIS — K219 Gastro-esophageal reflux disease without esophagitis: Secondary | ICD-10-CM

## 2018-06-04 MED ORDER — PANTOPRAZOLE SODIUM 20 MG PO TBEC: 20.0000 mg | DELAYED_RELEASE_TABLET | ORAL | 3 refills | Status: DC

## 2018-06-07 ENCOUNTER — Encounter: Payer: Self-pay | Admitting: Internal Medicine

## 2018-06-12 ENCOUNTER — Ambulatory Visit (INDEPENDENT_AMBULATORY_CARE_PROVIDER_SITE_OTHER): Payer: BLUE CROSS/BLUE SHIELD | Admitting: Internal Medicine

## 2018-06-12 ENCOUNTER — Other Ambulatory Visit: Payer: Self-pay

## 2018-06-12 ENCOUNTER — Encounter: Payer: Self-pay | Admitting: Internal Medicine

## 2018-06-12 ENCOUNTER — Ambulatory Visit: Payer: BLUE CROSS/BLUE SHIELD | Admitting: Internal Medicine

## 2018-06-12 DIAGNOSIS — Z1389 Encounter for screening for other disorder: Secondary | ICD-10-CM | POA: Diagnosis not present

## 2018-06-12 DIAGNOSIS — Z0000 Encounter for general adult medical examination without abnormal findings: Secondary | ICD-10-CM

## 2018-06-12 DIAGNOSIS — Z125 Encounter for screening for malignant neoplasm of prostate: Secondary | ICD-10-CM

## 2018-06-12 DIAGNOSIS — E785 Hyperlipidemia, unspecified: Secondary | ICD-10-CM | POA: Diagnosis not present

## 2018-06-12 DIAGNOSIS — R001 Bradycardia, unspecified: Secondary | ICD-10-CM

## 2018-06-12 DIAGNOSIS — I491 Atrial premature depolarization: Secondary | ICD-10-CM

## 2018-06-12 DIAGNOSIS — E559 Vitamin D deficiency, unspecified: Secondary | ICD-10-CM

## 2018-06-12 DIAGNOSIS — R42 Dizziness and giddiness: Secondary | ICD-10-CM | POA: Diagnosis not present

## 2018-06-12 DIAGNOSIS — Z1329 Encounter for screening for other suspected endocrine disorder: Secondary | ICD-10-CM

## 2018-06-12 DIAGNOSIS — Z8042 Family history of malignant neoplasm of prostate: Secondary | ICD-10-CM

## 2018-06-12 DIAGNOSIS — I493 Ventricular premature depolarization: Secondary | ICD-10-CM

## 2018-06-12 NOTE — Progress Notes (Signed)
Telephone Note failed Doxy  I connected with Roger [name]  on 06/12/18 at  3:44 PM EDT by telephone and verified that I am speaking with the correct person using two identifiers. Location patient: home Location provider:work  Persons participating in the virtual visit: patient, provider  I discussed the limitations of evaluation and management by telemedicine and the availability of in person appointments. The patient expressed understanding and agreed to proceed.   HPI: 1. Dizziness 2 weeks ago with nausea/vomitting room was spinning tried Meclizine x 1 and helped sx's to go away. No dizzines today  Overall he thinks dizziness is less since stopping BB and less fatigue  Denies ringing in his ears or hearing loss   2. 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 Bradycardia even though stopped Toprol XL HR today 56    4. Obesity weight is down to 275 per pt by changing diet I.e less sweets and sodas   ROS: See pertinent positives and negatives per HPI.  Past Medical History:  Diagnosis Date  . Bradycardia   . Chicken pox   . GERD (gastroesophageal reflux disease)   . Hiatal hernia    small noted 2013   . Hyperlipidemia   . Kidney stone   . Premature atrial contractions   . Premature ventricular contraction   . Vitamin D deficiency   . Wrist fracture    right     Past Surgical History:  Procedure Laterality Date  . LASIK     2004   . TONSILLECTOMY AND ADENOIDECTOMY  1978    Family History  Problem Relation Age of Onset  . Cancer Mother        kidney cancer   . Kidney disease Mother   . Diabetes Mother   . Hearing loss Mother   . Heart disease Mother   . Cancer Father        prostate, colon   . Kidney  disease Father   . Diabetes Father   . Hearing loss Father   . Stroke Maternal Grandmother   . Cancer Maternal Grandfather        bladder cancer, +smoker   . Alcohol abuse Maternal Grandfather     SOCIAL HX: married   Current Outpatient Medications:  .  Cholecalciferol (VITAMIN D3) 5000 units CAPS, Take 1 capsule by mouth daily., Disp: , Rfl:  .  fluticasone (FLONASE) 50 MCG/ACT nasal spray, Place 2 sprays into both nostrils daily., Disp: , Rfl:  .  meclizine (ANTIVERT) 12.5 MG tablet, Take 1 tablet (12.5 mg total) by mouth 2 (two) times daily as needed for dizziness or nausea., Disp: 30 tablet, Rfl: 0 .  Omega-3 Fatty Acids (FISH OIL) 1000 MG CAPS, Take 520 mg by mouth 4 (four) times daily. , Disp: , Rfl:  .  pantoprazole (PROTONIX) 20 MG tablet, Take 1 tablet (20 mg total) by mouth every morning. 30 minutes before food, Disp: 90 tablet, Rfl: 3 .  UNABLE TO FIND, Med Name: Tumeric, Disp: , Rfl:   EXAM:  VITALS per patient if applicable:  GENERAL: alert, oriented, appears well and in no acute distress  PSYCH/NEURO: pleasant and cooperative, no obvious depression or anxiety, speech and thought  processing grossly intact  ASSESSMENT AND PLAN:  Discussed the following assessment and plan:  Dizziness ddx vertigo vs cardiac with h/o PACs/PVCs  -if continues consider ENT and cardiology referrals in the future est. With Dr. Raliegh Ip  -pt will call back if has sx's again meclizine did help sx's 2 weeks ago   Hyperlipidemia, unspecified hyperlipidemia type - Plan: Lipid panel -rec healthy diet and exercise   PVC's (premature ventricular contractions) Premature atrial contraction Bradycardia -if he has associated heart palpitations and dizziness will refer back to Dr. Raliegh Ip pt to call back will need further w/u I.e repeat holter and consider repeat echo  -rec reduce caffeine and nonmedicinal ways to control anxiety for now anxiety about new job   EKG 12/2017 with cardiology Sinus  bradycardia EKG 01/23/18 Dr. Raliegh Ip cards normal saw brady/palp no w/u rec no med recs, control HLD   HM Declines flu shot rec mmr vaccine in future Tdaphad 06/05/16  Had 2/3 hep B vaccines, needs 3rd vaccine was due 12/10/17 FH prostate cancer in dad last PSA 0.89 05/29/17-repeat PSA  Testosterone normal 11/02/17  sch fasting labs next week of 06/18/2018   Former smoker quit 15-20 years ago smoker x 10 years 1/2 ppd max 1 ppd no FH lung cancer Former chew  -Congratulated on quitting both   I discussed the assessment and treatment plan with the patient. The patient was provided an opportunity to ask questions and all were answered. The patient agreed with the plan and demonstrated an understanding of the instructions.   The patient was advised to call back or seek an in-person evaluation if the symptoms worsen or if the condition fails to improve as anticipated.  Time spent 15 minutes  Delorise Jackson, MD

## 2018-06-19 ENCOUNTER — Telehealth: Payer: Self-pay | Admitting: Internal Medicine

## 2018-06-19 NOTE — Telephone Encounter (Signed)
Pt called back, advised pt of info below.  He will call once he get his new card but will have [employer] until June 28th 2020

## 2018-06-19 NOTE — Telephone Encounter (Signed)
When pt gets new insurance advise to call his insurance # on card and see if our clinic is in network   [place]

## 2018-06-19 NOTE — Telephone Encounter (Signed)
-----   Message from Larey Days sent at 06/19/2018 10:16 AM EDT ----- We file any coverage the patient needs to call his insurance carrier to see if he is in network. ----- Message ----- From: McLean-Scocuzza, Nino Glow, MD Sent: 06/12/2018   4:08 PM EDT To: Janese Banks we take [employer] insurance ?  [place]

## 2018-06-19 NOTE — Telephone Encounter (Signed)
Left message for patient to return call back. [facility] may give and obtain information.  

## 2018-06-27 ENCOUNTER — Other Ambulatory Visit: Payer: BLUE CROSS/BLUE SHIELD

## 2018-08-27 ENCOUNTER — Encounter: Payer: Self-pay | Admitting: Internal Medicine

## 2018-10-02 ENCOUNTER — Telehealth: Payer: Self-pay

## 2018-10-02 ENCOUNTER — Encounter: Payer: Self-pay | Admitting: Internal Medicine

## 2018-10-02 NOTE — Telephone Encounter (Signed)
Copied from [place]. Topic: General - Inquiry >> Oct 02, 2018  1:04 PM Mathis Bud wrote: Reason for CRM: Patient is returning [facility] call.  Office line was busy [phone]

## 2018-10-02 NOTE — Telephone Encounter (Signed)
Called patient concerning my chart message stating he ask for PCP to call him.

## 2018-10-02 NOTE — Telephone Encounter (Signed)
Per Dpr called and left patient detailed message on voicemail PCP out of office could we assist with any concern to please call the office.

## 2018-10-02 NOTE — Telephone Encounter (Signed)
PLEASE LET PATIENT KNOW THAT DR [facility] IS OUT OF THE OFFICE FOR A WEEK.  IF HE CAN WAIT TO TALK TO HER WHEN SHE RETURNS,  THAT WOULD BE BETTER BUT IT IF IT IS URGENT I NEED TO KNOW THE NATURE OF THE CALL

## 2018-10-03 NOTE — Telephone Encounter (Signed)
Spoken to patient. He is going to call back next week for Dr Olivia Mackie. It is about HEP B shot/ Immunity

## 2018-12-19 ENCOUNTER — Encounter: Payer: Self-pay | Admitting: Family Medicine

## 2018-12-19 ENCOUNTER — Ambulatory Visit (INDEPENDENT_AMBULATORY_CARE_PROVIDER_SITE_OTHER): Payer: 59 | Admitting: Family Medicine

## 2018-12-19 VITALS — BP 124/80 | HR 53 | Ht 72.0 in | Wt 287.0 lb

## 2018-12-19 DIAGNOSIS — M545 Low back pain, unspecified: Secondary | ICD-10-CM

## 2018-12-19 DIAGNOSIS — E559 Vitamin D deficiency, unspecified: Secondary | ICD-10-CM | POA: Diagnosis not present

## 2018-12-19 DIAGNOSIS — G8929 Other chronic pain: Secondary | ICD-10-CM

## 2018-12-19 DIAGNOSIS — M999 Biomechanical lesion, unspecified: Secondary | ICD-10-CM | POA: Diagnosis not present

## 2018-12-19 NOTE — Progress Notes (Signed)
[name] [address] [place], [place] [address] Phone: [phone] Subjective:   I Roger [name] am serving as a Education administrator for Dr. Hulan Saas.    This visit occurred during the SARS-CoV-2 public health emergency.  Safety protocols were in place, including screening questions prior to the visit, additional usage of staff PPE, and extensive cleaning of exam room while observing appropriate contact time as indicated for disinfecting solutions.      CC: neck pain   [id number]  Roger [name] is a 48 y.o. male coming in with complaint of back pain. Last seen on 12/13/2017. OMT performed to treat back pain. Lower back is painful. Wants OMT today.       Past Medical History:  Diagnosis Date  . Bradycardia   . Chicken pox   . GERD (gastroesophageal reflux disease)   . Hiatal hernia    small noted 2013   . Hyperlipidemia   . Kidney stone   . Premature atrial contractions   . Premature ventricular contraction   . Vitamin D deficiency   . Wrist fracture    right    Past Surgical History:  Procedure Laterality Date  . LASIK     2004   . TONSILLECTOMY AND ADENOIDECTOMY  1978   Social History   Socioeconomic History  . Marital status: Married    Spouse name: Not on file  . Number of children: Not on file  . Years of education: Not on file  . Highest education level: Not on file  Occupational History  . Not on file  Social Needs  . Financial resource strain: Not on file  . Food insecurity    Worry: Not on file    Inability: Not on file  . Transportation needs    Medical: Not on file    Non-medical: Not on file  Tobacco Use  . Smoking status: Former Research scientist (life sciences)  . Smokeless tobacco: Former Systems developer    Types: Chew  Substance and Sexual Activity  . Alcohol use: No  . Drug use: No  . Sexual activity: Yes  Lifestyle  . Physical activity    Days per week: Not on file    Minutes per session: Not on file  . Stress: Not on file  Relationships   . Social Herbalist on phone: Not on file    Gets together: Not on file    Attends religious service: Not on file    Active member of club or organization: Not on file    Attends meetings of clubs or organizations: Not on file    Relationship status: Not on file  Other Topics Concern  . 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    Feels safe in relationship, wears seat belt, owns guns in home    No Known Allergies Family History  Problem Relation Age of Onset  . Cancer Mother        kidney cancer   . Kidney disease Mother   . Diabetes Mother   . Hearing loss Mother   . Heart disease Mother   . Cancer Father        prostate, colon   . Kidney disease Father   . Diabetes Father   . Hearing loss Father   . Stroke Maternal Grandmother   .  Cancer Maternal Grandfather        bladder cancer, +smoker   . Alcohol abuse Maternal Grandfather       Current Outpatient Medications (Respiratory):  .  fluticasone (FLONASE) 50 MCG/ACT nasal spray, Place 2 sprays into both nostrils daily.    Current Outpatient Medications (Other):  Marland Kitchen  Cholecalciferol (VITAMIN D3) 5000 units CAPS, Take 1 capsule by mouth daily. .  meclizine (ANTIVERT) 12.5 MG tablet, Take 1 tablet (12.5 mg total) by mouth 2 (two) times daily as needed for dizziness or nausea. .  Omega-3 Fatty Acids (FISH OIL) 1000 MG CAPS, Take 520 mg by mouth 4 (four) times daily.  .  pantoprazole (PROTONIX) 20 MG tablet, Take 1 tablet (20 mg total) by mouth every morning. 30 minutes before food .  UNABLE TO FIND, Med Name: Tumeric    Past medical history, social, surgical and family history all reviewed in electronic medical record.  No pertanent information unless stated regarding to the chief complaint.   Review of Systems:  No headache, visual changes, nausea, vomiting, diarrhea,  constipation, dizziness, abdominal pain, skin rash, fevers, chills, night sweats, weight loss, swollen lymph nodes, body aches, joint swelling, chest pain, shortness of breath, mood changes.  Positive muscle aches  Objective  Blood pressure 124/80, pulse (!) 53, height 6' (1.829 m), weight 287 lb (130.2 kg), SpO2 97 %.    General: No apparent distress alert and oriented x3 mood and affect normal, dressed appropriately.  HEENT: Pupils equal, extraocular movements intact  Respiratory: Patient's speak in full sentences and does not appear short of breath  Cardiovascular: No lower extremity edema, non tender, no erythema  Skin: Warm dry intact with no signs of infection or rash on extremities or on axial skeleton.  Abdomen: Soft nontender  Neuro: Cranial nerves II through XII are intact, neurovascularly intact in all extremities with 2+ DTRs and 2+ pulses.  Lymph: No lymphadenopathy of posterior or anterior cervical chain or axillae bilaterally.  Gait normal with good balance and coordination.  MSK:  Non tender with full range of motion and good stability and symmetric strength and tone of shoulders, elbows, wrist, hip, knee and ankles bilaterally.  Neck exam does have some mild decrease in range of motion in all planes of 5 degrees.  Mild crepitus noted.  Negative Spurling's.  5 out of 5 strength of the upper extremity bilaterally.  Low back exam shows that there is tightness in the thoracolumbar juncture right side greater than left.  Mild muscle spasm in this area.  Mild tightness of the hip flexor on the right compared to the contralateral side.  Neurovascularly intact distally 5 out of 5 strength of the lower extremities.  Poor core strength  Osteopathic findings C3 flexed rotated and side bent right T3 extended rotated and side bent right inhaled third rib T7 extended rotated and side bent left L2 flexed rotated and side bent right Sacrum right on right     Impression and  Recommendations:     This case required medical decision making of moderate complexity. The above documentation has been reviewed and is accurate and complete Lyndal Pulley, DO       Note: This dictation was prepared with Dragon dictation along with smaller phrase technology. Any transcriptional errors that result from this process are unintentional.

## 2018-12-19 NOTE — Patient Instructions (Addendum)
Good to see you Exercise 3 times a week Change driving position Turmeric 500mg  daily  Tart cherry extract 1200mg  at night Vitamin D 2000 IU daily  See me again in 6 weeks

## 2018-12-20 ENCOUNTER — Encounter: Payer: Self-pay | Admitting: Family Medicine

## 2018-12-20 NOTE — Assessment & Plan Note (Signed)
Patient has not been seen for a year but has responded well to manipulation previously.  We discussed with patient about posture and ergonomics, discussed over-the-counter medications that I think will be beneficial.  Discussed what changes can be made over the course of time.  Patient is in agreement with the plan.  Follow-up with me again in 4 to 8 weeks

## 2018-12-20 NOTE — Assessment & Plan Note (Signed)
Encourage replacement

## 2018-12-20 NOTE — Assessment & Plan Note (Signed)
Decision today to treat with OMT was based on Physical Exam  After verbal consent patient was treated with HVLA, ME, FPR techniques in cervical, thoracic, lumbar and sacral areas  Patient tolerated the procedure well with improvement in symptoms  Patient given exercises, stretches and lifestyle modifications  See medications in patient instructions if given  Patient will follow up in 4-6 weeks 

## 2018-12-25 ENCOUNTER — Other Ambulatory Visit: Payer: Self-pay | Admitting: Internal Medicine

## 2018-12-25 DIAGNOSIS — Z20822 Contact with and (suspected) exposure to covid-19: Secondary | ICD-10-CM

## 2018-12-25 DIAGNOSIS — Z20828 Contact with and (suspected) exposure to other viral communicable diseases: Secondary | ICD-10-CM

## 2018-12-27 ENCOUNTER — Ambulatory Visit (INDEPENDENT_AMBULATORY_CARE_PROVIDER_SITE_OTHER): Payer: 59 | Admitting: Internal Medicine

## 2018-12-27 ENCOUNTER — Encounter: Payer: Self-pay | Admitting: Internal Medicine

## 2018-12-27 VITALS — Ht 72.0 in | Wt 271.0 lb

## 2018-12-27 DIAGNOSIS — Z0000 Encounter for general adult medical examination without abnormal findings: Secondary | ICD-10-CM

## 2018-12-27 DIAGNOSIS — Z1389 Encounter for screening for other disorder: Secondary | ICD-10-CM | POA: Diagnosis not present

## 2018-12-27 DIAGNOSIS — E559 Vitamin D deficiency, unspecified: Secondary | ICD-10-CM

## 2018-12-27 DIAGNOSIS — Z125 Encounter for screening for malignant neoplasm of prostate: Secondary | ICD-10-CM

## 2018-12-27 DIAGNOSIS — Z1329 Encounter for screening for other suspected endocrine disorder: Secondary | ICD-10-CM | POA: Diagnosis not present

## 2018-12-27 DIAGNOSIS — Z13220 Encounter for screening for lipoid disorders: Secondary | ICD-10-CM | POA: Diagnosis not present

## 2018-12-27 DIAGNOSIS — E785 Hyperlipidemia, unspecified: Secondary | ICD-10-CM

## 2018-12-27 NOTE — Progress Notes (Signed)
Telephone Note  I connected with Roger [name]  on 12/27/18 at  4:00 PM EST by telephone and verified that I am speaking with the correct person using two identifiers.  Location patient: home Location provider:work or home office Persons participating in the virtual visit: patient, provider  I discussed the limitations of evaluation and management by telemedicine and the availability of in person appointments. The patient expressed understanding and agreed to proceed.   HPI: Annual  1. Dizziness resolved only had to take meclizine x 1 but not had dizziness since  2. PVCs and bradycardia seen [place] cards last visit 12/2017 no current issues   ROS: See pertinent positives and negatives per HPI. General: wt down 171 lbs  HEENT: no sore throat  CV: no chest pain  Lungs: no sob  GI: no ab pain MSK: no ab pain  GU: no issues  Skin: no issue  Psych: no anxiety/depression  Neuro: no dizziness    Past Medical History:  Diagnosis Date  . Bradycardia   . Chicken pox   . GERD (gastroesophageal reflux disease)   . Hiatal hernia    small noted 2013   . Hyperlipidemia   . Kidney stone   . Premature atrial contractions   . Premature ventricular contraction   . Vitamin D deficiency   . Wrist fracture    right     Past Surgical History:  Procedure Laterality Date  . LASIK     2004   . TONSILLECTOMY AND ADENOIDECTOMY  1978    Family History  Problem Relation Age of Onset  . Cancer Mother        kidney cancer   . Kidney disease Mother   . Diabetes Mother   . Hearing loss Mother   . Heart disease Mother   . Cancer Father        prostate, colon   . Kidney disease Father   . Diabetes Father   . Hearing loss Father   . Stroke Maternal Grandmother   . Cancer Maternal Grandfather        bladder cancer, +smoker   . 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  Feels safe in relationship, wears seat belt, owns guns in home   Current Outpatient Medications:  .  Cholecalciferol (VITAMIN D3) 5000 units CAPS, Take 1 capsule by mouth daily., Disp: , Rfl:  .  fluticasone (FLONASE) 50 MCG/ACT nasal spray, Place 2 sprays into both nostrils daily., Disp: , Rfl:  .  meclizine (ANTIVERT) 12.5 MG tablet, Take 1 tablet (12.5 mg total) by mouth 2 (two) times daily as needed for dizziness or nausea., Disp: 30 tablet, Rfl: 0 .  pantoprazole (PROTONIX) 20 MG tablet, Take 1 tablet (20 mg total) by mouth every morning. 30 minutes before food, Disp: 90 tablet, Rfl: 3  EXAM: before failed video  VITALS per patient if applicable:  GENERAL: alert, oriented, appears well and in no acute distress  HEENT: atraumatic, conjunttiva clear, no obvious abnormalities on inspection of external nose and ears  NECK: normal movements of the head and neck  LUNGS: on inspection no signs of respiratory distress, breathing rate appears normal, no obvious gross SOB, gasping or wheezing  CV: no obvious cyanosis  MS: moves all visible extremities without noticeable abnormality  PSYCH/NEURO: pleasant and cooperative, no obvious depression or anxiety, speech and  thought processing grossly intact  ASSESSMENT AND PLAN:  Discussed the following assessment and plan:  Annual physical exam -  Declines flu shot rec mmr vaccine in future Tdaphad 06/05/16  Had 2/3 hep B vaccines, needs 3rd vaccine due FH prostate cancer in dad last PSA 0.89 05/29/17-repeat PSA  Testosteronenormal 11/02/17 sch fasting labs next week 01/21/19   Former smoker quit 15-20 years ago smoker x 10 years 1/2 ppd max 1 ppd no FH lung cancer Former chew -Congratulated on quitting both rec healthy diet and exercise  -we discussed possible serious and likely etiologies, options for evaluation and workup, limitations of telemedicine visit vs in person visit, treatment, treatment  risks and precautions. Pt prefers to treat via telemedicine empirically rather then risking or undertaking an in person visit at this moment. Patient agrees to seek prompt in person care if worsening, new symptoms arise, or if is not improving with treatment.   I discussed the assessment and treatment plan with the patient. The patient was provided an opportunity to ask questions and all were answered. The patient agreed with the plan and demonstrated an understanding of the instructions.   The patient was advised to call back or seek an in-person evaluation if the symptoms worsen or if the condition fails to improve as anticipated.  Time spent 20 minutes  Delorise Jackson, MD

## 2018-12-27 NOTE — Progress Notes (Signed)
fol

## 2018-12-28 ENCOUNTER — Encounter: Payer: Self-pay | Admitting: Internal Medicine

## 2018-12-28 NOTE — Progress Notes (Signed)
I have scheduled patient for both hep B & labs.

## 2018-12-29 ENCOUNTER — Encounter (INDEPENDENT_AMBULATORY_CARE_PROVIDER_SITE_OTHER): Payer: Self-pay

## 2018-12-30 ENCOUNTER — Encounter (INDEPENDENT_AMBULATORY_CARE_PROVIDER_SITE_OTHER): Payer: Self-pay

## 2019-01-22 ENCOUNTER — Ambulatory Visit: Payer: 59

## 2019-01-22 ENCOUNTER — Other Ambulatory Visit: Payer: 59

## 2019-03-13 ENCOUNTER — Encounter: Payer: Self-pay | Admitting: Internal Medicine

## 2019-03-13 DIAGNOSIS — K219 Gastro-esophageal reflux disease without esophagitis: Secondary | ICD-10-CM

## 2019-03-13 MED ORDER — PANTOPRAZOLE SODIUM 20 MG PO TBEC: 20.0000 mg | DELAYED_RELEASE_TABLET | ORAL | 3 refills | Status: DC

## 2019-05-19 ENCOUNTER — Encounter: Payer: Self-pay | Admitting: Internal Medicine

## 2019-06-09 DIAGNOSIS — L03114 Cellulitis of left upper limb: Secondary | ICD-10-CM | POA: Diagnosis not present

## 2019-06-09 DIAGNOSIS — S61211A Laceration without foreign body of left index finger without damage to nail, initial encounter: Secondary | ICD-10-CM | POA: Diagnosis not present

## 2019-06-14 ENCOUNTER — Encounter: Payer: Self-pay | Admitting: Internal Medicine

## 2019-06-14 ENCOUNTER — Other Ambulatory Visit: Payer: Self-pay | Admitting: Internal Medicine

## 2019-06-14 ENCOUNTER — Telehealth: Payer: Self-pay | Admitting: Internal Medicine

## 2019-06-14 DIAGNOSIS — Z0184 Encounter for antibody response examination: Secondary | ICD-10-CM

## 2019-06-14 DIAGNOSIS — Z1159 Encounter for screening for other viral diseases: Secondary | ICD-10-CM

## 2019-06-14 NOTE — Telephone Encounter (Signed)
Please call pt and see what he needs ?   Dr. Olivia Mackie needs more info?   [place]

## 2019-06-14 NOTE — Telephone Encounter (Signed)
1 more booster still needed but if he wants to test he can orders in

## 2019-06-14 NOTE — Telephone Encounter (Signed)
Patient Message Open  06/14/2019 [place]  McLean-Scocuzza, Nino Glow, MD Internal Medicine  Conversation: Non-Urgent Medical Question (Newest Message First)     06/14/19 9:49 AM Babs Bertin, CMA routed this conversation to McLean-Scocuzza, Nino Glow, MD  Windell Norfolk to McLean-Scocuzza, Nino Glow, MD   KT  06/14/19 9:38 AM Would rather talk to Dr. Olivia Mackie about something personal if that's OK.  Me to [place], [place]     06/14/19 9:33 AM Good morning!  I hope all is going well with you this Friday.   My name is Yves Dill, I am Dr Audrie Gallus new medical assistant working directly with her. Would you be willing to give me some information as to what you may be needing?   This allows me to help you both as much as possible as it may be something I can help you with or there could be things I can get set up for her so that she can help give you the best care in a timely manor that we can.   Thank you for allowing me to take part in your care and we look forward to hearing back from you.   Last read by Windell Norfolk at 9:34 AM on 06/14/2019. Windell Norfolk to McLean-Scocuzza, Nino Glow, MD   KT  06/14/19 6:22 AM Please have Dr Olivia Mackie call me today at her earliest convenience.  Thanks.

## 2019-06-14 NOTE — Telephone Encounter (Signed)
Noted  

## 2019-06-14 NOTE — Telephone Encounter (Signed)
Patient would like to be tested for Hep B. States he had the booster shots a little over a year ago. Has labs 06/21/19.   Please advise

## 2019-06-21 ENCOUNTER — Other Ambulatory Visit (INDEPENDENT_AMBULATORY_CARE_PROVIDER_SITE_OTHER): Payer: BC Managed Care – PPO

## 2019-06-21 ENCOUNTER — Other Ambulatory Visit: Payer: Self-pay

## 2019-06-21 DIAGNOSIS — Z1329 Encounter for screening for other suspected endocrine disorder: Secondary | ICD-10-CM | POA: Diagnosis not present

## 2019-06-21 DIAGNOSIS — Z0000 Encounter for general adult medical examination without abnormal findings: Secondary | ICD-10-CM | POA: Diagnosis not present

## 2019-06-21 DIAGNOSIS — Z1389 Encounter for screening for other disorder: Secondary | ICD-10-CM | POA: Diagnosis not present

## 2019-06-21 DIAGNOSIS — Z0184 Encounter for antibody response examination: Secondary | ICD-10-CM | POA: Diagnosis not present

## 2019-06-21 DIAGNOSIS — Z1159 Encounter for screening for other viral diseases: Secondary | ICD-10-CM

## 2019-06-21 DIAGNOSIS — Z125 Encounter for screening for malignant neoplasm of prostate: Secondary | ICD-10-CM | POA: Diagnosis not present

## 2019-06-21 DIAGNOSIS — E559 Vitamin D deficiency, unspecified: Secondary | ICD-10-CM | POA: Diagnosis not present

## 2019-06-21 DIAGNOSIS — Z13220 Encounter for screening for lipoid disorders: Secondary | ICD-10-CM

## 2019-06-21 LAB — COMPREHENSIVE METABOLIC PANEL
ALT: 25 U/L (ref 0–53)
AST: 19 U/L (ref 0–37)
Albumin: 4.7 g/dL (ref 3.5–5.2)
Alkaline Phosphatase: 59 U/L (ref 39–117)
BUN: 12 mg/dL (ref 6–23)
CO2: 27 mEq/L (ref 19–32)
Calcium: 9.1 mg/dL (ref 8.4–10.5)
Chloride: 105 mEq/L (ref 96–112)
Creatinine, Ser: 0.91 mg/dL (ref 0.40–1.50)
GFR: 88.55 mL/min (ref >60.00)
Glucose, Bld: 96 mg/dL (ref 70–99)
Potassium: 3.9 mEq/L (ref 3.5–5.1)
Sodium: 139 mEq/L (ref 135–145)
Total Bilirubin: 0.8 mg/dL (ref 0.2–1.2)
Total Protein: 6.9 g/dL (ref 6.0–8.3)

## 2019-06-21 LAB — CBC WITH DIFFERENTIAL/PLATELET
Basophils Absolute: 0 10*3/uL (ref 0.0–0.1)
Basophils Relative: 0.7 % (ref 0.0–3.0)
Eosinophils Absolute: 0.1 10*3/uL (ref 0.0–0.7)
Eosinophils Relative: 2.4 % (ref 0.0–5.0)
HCT: 43.7 % (ref 39.0–52.0)
Hemoglobin: 14.8 g/dL (ref 13.0–17.0)
Lymphocytes Relative: 35.3 % (ref 12.0–46.0)
Lymphs Abs: 1.6 10*3/uL (ref 0.7–4.0)
MCHC: 33.9 g/dL (ref 30.0–36.0)
MCV: 88.9 fl (ref 78.0–100.0)
Monocytes Absolute: 0.4 10*3/uL (ref 0.1–1.0)
Monocytes Relative: 9.3 % (ref 3.0–12.0)
Neutro Abs: 2.4 10*3/uL (ref 1.4–7.7)
Neutrophils Relative %: 52.3 % (ref 43.0–77.0)
Platelets: 191 10*3/uL (ref 150.0–400.0)
RBC: 4.91 Mil/uL (ref 4.22–5.81)
RDW: 14.3 % (ref 11.5–15.5)
WBC: 4.6 10*3/uL (ref 4.0–10.5)

## 2019-06-21 LAB — LIPID PANEL
Cholesterol: 174 mg/dL (ref 0–200)
HDL: 32.5 mg/dL — ABNORMAL LOW (ref >39.00)
LDL Cholesterol: 122 mg/dL — ABNORMAL HIGH (ref 0–99)
NonHDL: 141.9
Total CHOL/HDL Ratio: 5
Triglycerides: 102 mg/dL (ref 0.0–149.0)
VLDL: 20.4 mg/dL (ref 0.0–40.0)

## 2019-06-21 LAB — TSH: TSH: 2.42 u[IU]/mL (ref 0.35–4.50)

## 2019-06-21 LAB — VITAMIN D 25 HYDROXY (VIT D DEFICIENCY, FRACTURES): VITD: 39.11 ng/mL (ref 30.00–100.00)

## 2019-06-21 LAB — PSA: PSA: 0.81 ng/mL (ref 0.10–4.00)

## 2019-06-22 LAB — URINALYSIS, ROUTINE W REFLEX MICROSCOPIC
Bilirubin Urine: NEGATIVE
Glucose, UA: NEGATIVE
Hgb urine dipstick: NEGATIVE
Ketones, ur: NEGATIVE
Leukocytes,Ua: NEGATIVE
Nitrite: NEGATIVE
Protein, ur: NEGATIVE
Specific Gravity, Urine: 1.023 (ref 1.001–1.03)
pH: 6.5 (ref 5.0–8.0)

## 2019-06-25 ENCOUNTER — Encounter: Payer: Self-pay | Admitting: Internal Medicine

## 2019-06-25 ENCOUNTER — Other Ambulatory Visit: Payer: Self-pay

## 2019-06-25 ENCOUNTER — Ambulatory Visit (INDEPENDENT_AMBULATORY_CARE_PROVIDER_SITE_OTHER): Payer: BC Managed Care – PPO | Admitting: Internal Medicine

## 2019-06-25 VITALS — BP 110/64 | HR 55 | Temp 97.2°F | Ht 72.0 in | Wt 294.0 lb

## 2019-06-25 DIAGNOSIS — Z1211 Encounter for screening for malignant neoplasm of colon: Secondary | ICD-10-CM

## 2019-06-25 DIAGNOSIS — Z800 Family history of malignant neoplasm of digestive organs: Secondary | ICD-10-CM

## 2019-06-25 DIAGNOSIS — T148XXA Other injury of unspecified body region, initial encounter: Secondary | ICD-10-CM

## 2019-06-25 DIAGNOSIS — K219 Gastro-esophageal reflux disease without esophagitis: Secondary | ICD-10-CM

## 2019-06-25 DIAGNOSIS — R001 Bradycardia, unspecified: Secondary | ICD-10-CM | POA: Diagnosis not present

## 2019-06-25 DIAGNOSIS — I493 Ventricular premature depolarization: Secondary | ICD-10-CM | POA: Diagnosis not present

## 2019-06-25 LAB — HEPATITIS B SURFACE ANTIBODY, QUANTITATIVE: Hep B S AB Quant (Post): >1000 m[IU]/mL (ref >=10)

## 2019-06-25 MED ORDER — MUPIROCIN 2 % EX OINT: 1.0000 "application " | TOPICAL_OINTMENT | Freq: Two times a day (BID) | CUTANEOUS | 1 refills | Status: DC

## 2019-06-25 NOTE — Progress Notes (Addendum)
Chief Complaint  Patient presents with  . Follow-up   F/u  1. Pvcs, Bradycardia w/o sx's established with cards  2. Reviewed labs improved, normal PSA 3. GERD controlled on otc omeprazole 4. Left hand cellulitis given 7 day cefdinir and helped resolve still has some redness to hand but left hand 1st index finger and back of hand was red and swollen he saw [facility] 06/09/19   Review of Systems  Constitutional: Negative for weight loss.  HENT: Negative for hearing loss.   Eyes: Negative for blurred vision.  Respiratory: Negative for shortness of breath.   Cardiovascular: Negative for chest pain.  Gastrointestinal: Negative for abdominal pain.  Musculoskeletal: Negative for falls.  Skin: Negative for rash.  Neurological: Negative for headaches.  Psychiatric/Behavioral: Negative for depression.   Past Medical History:  Diagnosis Date  . Bradycardia   . Chicken pox   . GERD (gastroesophageal reflux disease)   . Hiatal hernia    small noted 2013   . Hyperlipidemia   . Kidney stone   . Premature atrial contractions   . Premature ventricular contraction   . Vitamin D deficiency   . Wrist fracture    right    Past Surgical History:  Procedure Laterality Date  . LASIK     2004   . TONSILLECTOMY AND ADENOIDECTOMY  1978   Family History  Problem Relation Age of Onset  . Cancer Mother        kidney cancer   . Kidney disease Mother   . Diabetes Mother   . Hearing loss Mother   . Heart disease Mother   . Cancer Father        prostate, colon   . Kidney disease Father   . Diabetes Father   . Hearing loss Father   . Stroke Maternal Grandmother   . Cancer Maternal Grandfather        bladder cancer, +smoker   . Alcohol abuse Maternal Grandfather    Social History   Socioeconomic History  . Marital status: Married    Spouse name: Not on file  . Number of children: Not on file  . Years of education: Not on file  . Highest education level: Not on file  Occupational History  .  Not on file  Tobacco Use  . Smoking status: Former Research scientist (life sciences)  . Smokeless tobacco: Former Systems developer    Types: Chew  Substance and Sexual Activity  . Alcohol use: No  . Drug use: No  . Sexual activity: Yes  Other Topics Concern  . 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    Feels safe in relationship, wears seat belt, owns guns in home    Social Determinants of Health   Financial Resource Strain:   . Difficulty of Paying Living Expenses:   Food Insecurity:   . Worried About Charity fundraiser in the Last Year:   . Arboriculturist in the Last Year:   Transportation Needs:   . Film/video editor (Medical):   Marland Kitchen Lack of Transportation (Non-Medical):   Physical Activity:   . Days of Exercise per Week:   . Minutes of Exercise per Session:   Stress:   . Feeling of Stress :   Social Connections:   . Frequency of Communication with Friends and Family:   . Frequency  of Social Gatherings with Friends and Family:   . Attends Religious Services:   . Active Member of Clubs or Organizations:   . Attends Archivist Meetings:   Marland Kitchen Marital Status:   Intimate Partner Violence:   . Fear of Current or Ex-Partner:   . Emotionally Abused:   Marland Kitchen Physically Abused:   . Sexually Abused:    Current Meds  Medication Sig  . fluticasone (FLONASE) 50 MCG/ACT nasal spray Place 2 sprays into both nostrils daily.  Marland Kitchen OMEPRAZOLE PO Take by mouth as needed.   No Known Allergies Recent Results (from the past 2160 hour(s))  Hepatitis B surface antibody,quantitative     Status: None   Collection Time: 06/21/19  8:26 AM  Result Value Ref Range   Hepatitis B-Post >1,000 > OR = 10 mIU/mL    Comment: . Patient has immunity to hepatitis B virus. . For additional information, please refer to http://education.[facility].com/faq/FAQ105 (This link is being  provided for informational/ educational purposes only).   PSA     Status: None   Collection Time: 06/21/19  8:26 AM  Result Value Ref Range   PSA 0.81 0.10 - 4.00 ng/mL    Comment: Test performed using Access Hybritech PSA Assay, a parmagnetic partical, chemiluminecent immunoassay.  Vitamin D (25 hydroxy)     Status: None   Collection Time: 06/21/19  8:26 AM  Result Value Ref Range   VITD 39.11 30.00 - 100.00 ng/mL  Urinalysis, Routine w reflex microscopic     Status: None   Collection Time: 06/21/19  8:26 AM  Result Value Ref Range   Color, Urine YELLOW YELLOW   APPearance CLEAR CLEAR   Specific Gravity, Urine 1.023 1.001 - 1.03   pH 6.5 5.0 - 8.0   Glucose, UA NEGATIVE NEGATIVE   Bilirubin Urine NEGATIVE NEGATIVE   Ketones, ur NEGATIVE NEGATIVE   Hgb urine dipstick NEGATIVE NEGATIVE   Protein, ur NEGATIVE NEGATIVE   Nitrite NEGATIVE NEGATIVE   Leukocytes,Ua NEGATIVE NEGATIVE  TSH     Status: None   Collection Time: 06/21/19  8:26 AM  Result Value Ref Range   TSH 2.42 0.35 - 4.50 uIU/mL  Lipid panel     Status: Abnormal   Collection Time: 06/21/19  8:26 AM  Result Value Ref Range   Cholesterol 174 0 - 200 mg/dL    Comment: ATP III Classification       Desirable:  < 200 mg/dL               Borderline High:  200 - 239 mg/dL          High:  > = 240 mg/dL   Triglycerides 102.0 0.0 - 149.0 mg/dL    Comment: Normal:  <150 mg/dLBorderline High:  150 - 199 mg/dL   HDL 32.50 (L) >39.00 mg/dL   VLDL 20.4 0.0 - 40.0 mg/dL   LDL Cholesterol 122 (H) 0 - 99 mg/dL   Total CHOL/HDL Ratio 5     Comment:                Men          Women1/2 Average Risk     3.4          3.3Average Risk          5.0          4.42X Average Risk          9.6          7.13X  Average Risk          15.0          11.0                       NonHDL 141.90     Comment: NOTE:  Non-HDL goal should be 30 mg/dL higher than patient's LDL goal (i.e. LDL goal of < 70 mg/dL, would have non-HDL goal of < 100 mg/dL)  CBC with  Differential/Platelet     Status: None   Collection Time: 06/21/19  8:26 AM  Result Value Ref Range   WBC 4.6 4.0 - 10.5 K/uL   RBC 4.91 4.22 - 5.81 Mil/uL   Hemoglobin 14.8 13.0 - 17.0 g/dL   HCT 43.7 39.0 - 52.0 %   MCV 88.9 78.0 - 100.0 fl   MCHC 33.9 30.0 - 36.0 g/dL   RDW 14.3 11.5 - 15.5 %   Platelets 191.0 150.0 - 400.0 K/uL   Neutrophils Relative % 52.3 43.0 - 77.0 %   Lymphocytes Relative 35.3 12.0 - 46.0 %   Monocytes Relative 9.3 3.0 - 12.0 %   Eosinophils Relative 2.4 0.0 - 5.0 %   Basophils Relative 0.7 0.0 - 3.0 %   Neutro Abs 2.4 1.4 - 7.7 K/uL   Lymphs Abs 1.6 0.7 - 4.0 K/uL   Monocytes Absolute 0.4 0.1 - 1.0 K/uL   Eosinophils Absolute 0.1 0.0 - 0.7 K/uL   Basophils Absolute 0.0 0.0 - 0.1 K/uL  Comprehensive metabolic panel     Status: None   Collection Time: 06/21/19  8:26 AM  Result Value Ref Range   Sodium 139 135 - 145 mEq/L   Potassium 3.9 3.5 - 5.1 mEq/L   Chloride 105 96 - 112 mEq/L   CO2 27 19 - 32 mEq/L   Glucose, Bld 96 70 - 99 mg/dL   BUN 12 6 - 23 mg/dL   Creatinine, Ser 0.91 0.40 - 1.50 mg/dL   Total Bilirubin 0.8 0.2 - 1.2 mg/dL   Alkaline Phosphatase 59 39 - 117 U/L   AST 19 0 - 37 U/L   ALT 25 0 - 53 U/L   Total Protein 6.9 6.0 - 8.3 g/dL   Albumin 4.7 3.5 - 5.2 g/dL   GFR 88.55 >60.00 mL/min   Calcium 9.1 8.4 - 10.5 mg/dL   Objective  Body mass index is 39.87 kg/m. Wt Readings from Last 3 Encounters:  06/25/19 294 lb (133.4 kg)  12/27/18 271 lb (122.9 kg)  12/19/18 287 lb (130.2 kg)   Temp Readings from Last 3 Encounters:  06/25/19 (!) 97.2 F (36.2 C) (Temporal)  01/03/18 98.4 F (36.9 C) (Oral)  10/12/17 98.7 F (37.1 C) (Oral)   BP Readings from Last 3 Encounters:  06/25/19 110/64  12/19/18 124/80  01/03/18 112/78   Pulse Readings from Last 3 Encounters:  06/25/19 (!) 55  12/19/18 (!) 53  01/03/18 (!) 57    Physical Exam Vitals and nursing note reviewed.  Constitutional:      Appearance: Normal appearance. He  is well-developed and well-groomed.  HENT:     Head: Normocephalic and atraumatic.  Eyes:     Conjunctiva/sclera: Conjunctivae normal.     Pupils: Pupils are equal, round, and reactive to light.  Cardiovascular:     Rate and Rhythm: Normal rate and regular rhythm.     Heart sounds: Normal heart sounds. No murmur.  Pulmonary:     Effort: Pulmonary effort is normal.  Breath sounds: Normal breath sounds.  Skin:    General: Skin is warm and dry.  Neurological:     General: No focal deficit present.     Mental Status: He is alert and oriented to person, place, and time. Mental status is at baseline.     Gait: Gait normal.  Psychiatric:        Attention and Perception: Attention and perception normal.        Mood and Affect: Mood and affect normal.        Speech: Speech normal.        Behavior: Behavior normal. Behavior is cooperative.        Thought Content: Thought content normal.        Cognition and Memory: Cognition and memory normal.        Judgment: Judgment normal.     Assessment  Plan  Sinus bradycardia  FH: colon cancer - Plan: Ambulatory referral to Gastroenterology  Screening for colon cancer - Plan: Ambulatory referral to Gastroenterology  PVC's (premature ventricular contractions) Bradycardia F/u with cards  Gastroesophageal reflux disease, unspecified whether esophagitis present otc omeprazole   Left hand 1st index open wound  Bactroban prn  tdap utd   HM-CPE at f/u  Declines flu shot rec mmr vaccine in future Tdaphad 06/05/16  Had 2/3 hep B vaccines immune as  Of 05/2019 FH prostate cancer in dad last PSA 0.89 05/29/17-repeat PSAnormal Testosteronenormal 11/02/17   Former smoker quit 15-20 years ago smoker x 10 years 1/2 ppd max 1 ppd no FH lung cancer Former [place] as of 06/25/19  -Congratulated on quitting both rec healthy diet and exercise  Provider: Dr. Olivia Mackie McLean-Scocuzza-Internal Medicine

## 2019-06-25 NOTE — Addendum Note (Signed)
Addended by: Orland Mustard on: 06/25/2019 05:09 PM   Modules accepted: Orders

## 2019-06-28 ENCOUNTER — Ambulatory Visit: Payer: 59 | Admitting: Internal Medicine

## 2019-07-21 DIAGNOSIS — S6992XA Unspecified injury of left wrist, hand and finger(s), initial encounter: Secondary | ICD-10-CM | POA: Diagnosis not present

## 2019-08-15 ENCOUNTER — Encounter: Payer: Self-pay | Admitting: Internal Medicine

## 2019-08-29 ENCOUNTER — Encounter: Payer: 59 | Admitting: Dermatology

## 2019-09-02 ENCOUNTER — Other Ambulatory Visit: Payer: Self-pay

## 2019-09-02 ENCOUNTER — Ambulatory Visit (INDEPENDENT_AMBULATORY_CARE_PROVIDER_SITE_OTHER): Payer: BC Managed Care – PPO | Admitting: Dermatology

## 2019-09-02 DIAGNOSIS — Z1283 Encounter for screening for malignant neoplasm of skin: Secondary | ICD-10-CM

## 2019-09-02 DIAGNOSIS — B079 Viral wart, unspecified: Secondary | ICD-10-CM | POA: Diagnosis not present

## 2019-09-02 DIAGNOSIS — L820 Inflamed seborrheic keratosis: Secondary | ICD-10-CM | POA: Diagnosis not present

## 2019-09-02 DIAGNOSIS — D485 Neoplasm of uncertain behavior of skin: Secondary | ICD-10-CM

## 2019-09-02 NOTE — Progress Notes (Signed)
Follow-Up Visit   Subjective  Roger [name] is a 49 y.o. male who presents for the following: Annual Exam (No history of skin cancer - TBSE today) and Other (Spots of left knee, scalp, right neck). The patient presents for Total-Body Skin Exam (TBSE) for skin cancer screening and mole check.  The following portions of the chart were reviewed this encounter and updated as appropriate:  Tobacco  Allergies  Meds  Problems  Med Hx  Surg Hx  Fam Hx     Review of Systems:  No other skin or systemic complaints except as noted in HPI or Assessment and Plan.  Objective  Well appearing patient in no apparent distress; mood and affect are within normal limits.  A full examination was performed including scalp, head, eyes, ears, nose, lips, neck, chest, axillae, abdomen, back, buttocks, bilateral upper extremities, bilateral lower extremities, hands, feet, fingers, toes, fingernails, and toenails. All findings within normal limits unless otherwise noted below.  Objective  Right arm x 1, neck x 6 (7): Erythematous keratotic or waxy stuck-on papule or plaque.   Objective  above left knee: 0.6 cm red hyperkeratotic papule   Assessment & Plan    Lentigines - Scattered tan macules - Discussed due to sun exposure - Benign, observe - Call for any changes  Seborrheic Keratoses - Stuck-on, waxy, tan-brown papules and plaques  - Discussed benign etiology and prognosis. - Observe - Call for any changes  Melanocytic Nevi - Tan-brown and/or pink-flesh-colored symmetric macules and papules - Benign appearing on exam today - Observation - Call clinic for new or changing moles - Recommend daily use of broad spectrum spf 30+ sunscreen to sun-exposed areas.   Hemangiomas - Red papules - Discussed benign nature - Observe - Call for any changes  Actinic Damage - diffuse scaly erythematous macules with underlying dyspigmentation - Recommend daily broad spectrum sunscreen SPF 30+ to  sun-exposed areas, reapply every 2 hours as needed.  - Call for new or changing lesions.  Skin cancer screening performed today.   Inflamed seborrheic keratosis (7) Right arm x 1, neck x 6  Destruction of lesion - Right arm x 1, neck x 6 Complexity: simple   Destruction method: cryotherapy   Informed consent: discussed and consent obtained   Timeout:  patient name, date of birth, surgical site, and procedure verified Lesion destroyed using liquid nitrogen: Yes   Region frozen until ice ball extended beyond lesion: Yes   Outcome: patient tolerated procedure well with no complications   Post-procedure details: wound care instructions given    Neoplasm of uncertain behavior of skin above left knee  Epidermal / dermal shaving  Lesion diameter (cm):  0.6 Timeout: patient name, date of birth, surgical site, and procedure verified   Procedure prep:  Patient was prepped and draped in usual sterile fashion Prep type:  Isopropyl alcohol Anesthesia: the lesion was anesthetized in a standard fashion   Anesthetic:  1% lidocaine w/ epinephrine 1-100,000 buffered w/ 8.4% NaHCO3 Instrument used: flexible razor blade   Hemostasis achieved with: pressure, aluminum chloride and electrodesiccation   Outcome: patient tolerated procedure well   Post-procedure details: sterile dressing applied and wound care instructions given   Dressing type: bandage and petrolatum    Specimen 1 - Surgical pathology Differential Diagnosis: Dermatofibroma vs other R/O Ca Check Margins: No 0.6 cm red hyperkeratotic papule  Skin cancer screening  Return in about 1 year (around 09/01/2020).   I, Ashok Cordia, CMA, am acting as scribe for  Sarina Ser, MD .  Documentation: I have reviewed the above documentation for accuracy and completeness, and I agree with the above.  Sarina Ser, MD

## 2019-09-02 NOTE — Patient Instructions (Signed)

## 2019-09-03 ENCOUNTER — Encounter: Payer: Self-pay | Admitting: Dermatology

## 2019-09-09 ENCOUNTER — Telehealth: Payer: Self-pay

## 2019-09-09 NOTE — Telephone Encounter (Signed)
Biopsy results discussed with pt  

## 2019-09-09 NOTE — Telephone Encounter (Signed)
-----   Message from Ralene Bathe, MD sent at 09/09/2019  1:41 PM EDT ----- Skin , above left knee VERRUCA VULGARIS, IRRITATED  Benign viral wart May recur

## 2019-09-24 DIAGNOSIS — H52223 Regular astigmatism, bilateral: Secondary | ICD-10-CM | POA: Diagnosis not present

## 2019-10-04 DIAGNOSIS — H903 Sensorineural hearing loss, bilateral: Secondary | ICD-10-CM | POA: Diagnosis not present

## 2019-10-04 DIAGNOSIS — H9313 Tinnitus, bilateral: Secondary | ICD-10-CM | POA: Diagnosis not present

## 2019-10-09 ENCOUNTER — Other Ambulatory Visit: Payer: Self-pay

## 2019-10-09 ENCOUNTER — Encounter: Payer: Self-pay | Admitting: Internal Medicine

## 2019-10-09 ENCOUNTER — Ambulatory Visit (AMBULATORY_SURGERY_CENTER): Payer: Self-pay | Admitting: *Deleted

## 2019-10-09 VITALS — Ht 72.0 in | Wt 298.0 lb

## 2019-10-09 DIAGNOSIS — Z1211 Encounter for screening for malignant neoplasm of colon: Secondary | ICD-10-CM

## 2019-10-09 DIAGNOSIS — Z01818 Encounter for other preprocedural examination: Secondary | ICD-10-CM

## 2019-10-09 MED ORDER — NA SULFATE-K SULFATE-MG SULF 17.5-3.13-1.6 GM/177ML PO SOLN: ORAL | 0 refills | Status: DC

## 2019-10-09 NOTE — Progress Notes (Signed)
Patient is here in-person for PV. Patient denies any allergies to eggs or soy. Patient denies any problems with anesthesia/sedation. Patient denies any oxygen use at home. Patient denies taking any diet/weight loss medications or blood thinners. Patient is not being treated for MRSA or C-diff. Patient is aware of our care-partner policy and NATFT-73 safety protocol. EMMI education assisgned to the patient for the procedure, sent to MyChart.   COVID-19 test Is on 10/23/19 pt is aware Prep Prescription coupon given to the patient.

## 2019-10-23 DIAGNOSIS — Z1159 Encounter for screening for other viral diseases: Secondary | ICD-10-CM | POA: Diagnosis not present

## 2019-10-25 ENCOUNTER — Other Ambulatory Visit: Payer: Self-pay

## 2019-10-25 ENCOUNTER — Ambulatory Visit (AMBULATORY_SURGERY_CENTER): Payer: BC Managed Care – PPO | Admitting: Internal Medicine

## 2019-10-25 ENCOUNTER — Encounter: Payer: Self-pay | Admitting: Internal Medicine

## 2019-10-25 VITALS — BP 105/68 | HR 55 | Temp 97.8°F | Resp 17 | Ht 72.0 in | Wt 298.0 lb

## 2019-10-25 DIAGNOSIS — D125 Benign neoplasm of sigmoid colon: Secondary | ICD-10-CM

## 2019-10-25 DIAGNOSIS — D122 Benign neoplasm of ascending colon: Secondary | ICD-10-CM

## 2019-10-25 DIAGNOSIS — Z1211 Encounter for screening for malignant neoplasm of colon: Secondary | ICD-10-CM | POA: Diagnosis not present

## 2019-10-25 DIAGNOSIS — K635 Polyp of colon: Secondary | ICD-10-CM

## 2019-10-25 DIAGNOSIS — D123 Benign neoplasm of transverse colon: Secondary | ICD-10-CM

## 2019-10-25 MED ORDER — SODIUM CHLORIDE 0.9 % IV SOLN: 500.0000 mL | Freq: Once | INTRAVENOUS | Status: DC

## 2019-10-25 NOTE — Progress Notes (Signed)
Called to room to assist during endoscopic procedure.  Patient ID and intended procedure confirmed with present staff. Received instructions for my participation in the procedure from the performing physician.  

## 2019-10-25 NOTE — Progress Notes (Signed)
Pt. Reports no change in his medical or surgical history since his pre-visit 10/09/2019.

## 2019-10-25 NOTE — Op Note (Signed)
[place] Patient Name: Roger [name] Procedure Date: 10/25/2019 1:18 PM MRN: [id number] Endoscopist: Jerene Bears , MD Age: 49 Referring MD:  Date of Birth: Feb 22, 1970 Gender: Male Account #: [id number] Procedure:                Colonoscopy Indications:              Screening in patient at increased risk: Family                            history of 1st-degree relative with colorectal                            cancer, This is the patient's first colonoscopy Medicines:                Monitored Anesthesia Care Procedure:                Pre-Anesthesia Assessment:                           - Prior to the procedure, a History and Physical                            was performed, and patient medications and                            allergies were reviewed. The patient's tolerance of                            previous anesthesia was also reviewed. The risks                            and benefits of the procedure and the sedation                            options and risks were discussed with the patient.                            All questions were answered, and informed consent                            was obtained. Prior Anticoagulants: The patient has                            taken no previous anticoagulant or antiplatelet                            agents. ASA Grade Assessment: III - A patient with                            severe systemic disease. After reviewing the risks                            and benefits, the patient was deemed in  satisfactory condition to undergo the procedure.                           After obtaining informed consent, the colonoscope                            was passed under direct vision. Throughout the                            procedure, the patient's blood pressure, pulse, and                            oxygen saturations were monitored continuously. The                            Colonoscope was  introduced through the anus and                            advanced to the cecum, identified by appendiceal                            orifice and ileocecal valve. The colonoscopy was                            performed without difficulty. The patient tolerated                            the procedure well. The quality of the bowel                            preparation was good. The ileocecal valve,                            appendiceal orifice, and rectum were photographed. Scope In: 1:23:57 PM Scope Out: 1:37:36 PM Scope Withdrawal Time: 0 hours 11 minutes 17 seconds  Total Procedure Duration: 0 hours 13 minutes 39 seconds  Findings:                 The digital rectal exam was normal.                           A 9 mm polyp was found in the ascending colon. The                            polyp was sessile. The polyp was removed with a                            cold snare. Resection and retrieval were complete.                           A 6 mm polyp was found in the transverse colon. The                            polyp was  sessile. The polyp was removed with a                            cold snare. Resection and retrieval were complete.                           A 6 mm polyp was found in the distal sigmoid colon.                            The polyp was sessile. The polyp was removed with a                            cold snare. Resection and retrieval were complete.                           Internal hemorrhoids were found during                            retroflexion. The hemorrhoids were small. Complications:            No immediate complications. Estimated Blood Loss:     Estimated blood loss was minimal. Impression:               - One 9 mm polyp in the ascending colon, removed                            with a cold snare. Resected and retrieved.                           - One 6 mm polyp in the transverse colon, removed                            with a cold snare. Resected  and retrieved.                           - One 6 mm polyp in the distal sigmoid colon,                            removed with a cold snare. Resected and retrieved.                           - Small internal hemorrhoids. Recommendation:           - Patient has a contact number available for                            emergencies. The signs and symptoms of potential                            delayed complications were discussed with the                            patient. Return to normal activities tomorrow.  Written discharge instructions were provided to the                            patient.                           - Resume previous diet.                           - Continue present medications.                           - Await pathology results.                           - Repeat colonoscopy is recommended for                            surveillance. The colonoscopy date will be                            determined after pathology results from today's                            exam become available for review. Jerene Bears, MD 10/25/2019 1:40:29 PM This report has been signed electronically.

## 2019-10-25 NOTE — Progress Notes (Signed)
PT taken to PACU. Monitors in place. VSS. Report given to RN. 

## 2019-10-25 NOTE — Patient Instructions (Signed)
Information on polyps and hemorrhoids given to you today.  Await pathology results.  Resume previous diet and medications.   YOU HAD AN ENDOSCOPIC PROCEDURE TODAY AT THE [facility]:   Refer to the procedure report that was given to you for any specific questions about what was found during the examination.  If the procedure report does not answer your questions, please call your gastroenterologist to clarify.  If you requested that your care partner not be given the details of your procedure findings, then the procedure report has been included in a sealed envelope for you to review at your convenience later.  YOU SHOULD EXPECT: Some feelings of bloating in the abdomen. Passage of more gas than usual.  Walking can help get rid of the air that was put into your GI tract during the procedure and reduce the bloating. If you had a lower endoscopy (such as a colonoscopy or flexible sigmoidoscopy) you may notice spotting of blood in your stool or on the toilet paper. If you underwent a bowel prep for your procedure, you may not have a normal bowel movement for a few days.  Please Note:  You might notice some irritation and congestion in your nose or some drainage.  This is from the oxygen used during your procedure.  There is no need for concern and it should clear up in a day or so.  SYMPTOMS TO REPORT IMMEDIATELY:  Following lower endoscopy (colonoscopy or flexible sigmoidoscopy):  Excessive amounts of blood in the stool  Significant tenderness or worsening of abdominal pains  Swelling of the abdomen that is new, acute  Fever of 100F or higher   For urgent or emergent issues, a gastroenterologist can be reached at any hour by calling [phone]. Do not use MyChart messaging for urgent concerns.    DIET:  We do recommend a small meal at first, but then you may proceed to your regular diet.  Drink plenty of fluids but you should avoid alcoholic beverages for 24  hours.  ACTIVITY:  You should plan to take it easy for the rest of today and you should NOT DRIVE or use heavy machinery until tomorrow (because of the sedation medicines used during the test).    FOLLOW UP: Our staff will call the number listed on your records 48-72 hours following your procedure to check on you and address any questions or concerns that you may have regarding the information given to you following your procedure. If we do not reach you, we will leave a message.  We will attempt to reach you two times.  During this call, we will ask if you have developed any symptoms of COVID 19. If you develop any symptoms (ie: fever, flu-like symptoms, shortness of breath, cough etc.) before then, please call [phone].  If you test positive for Covid 19 in the 2 weeks post procedure, please call and report this information to us.    If any biopsies were taken you will be contacted by phone or by letter within the next 1-3 weeks.  Please call us at [phone] if you have not heard about the biopsies in 3 weeks.    SIGNATURES/CONFIDENTIALITY: You and/or your care partner have signed paperwork which will be entered into your electronic medical record.  These signatures attest to the fact that that the information above on your After Visit Summary has been reviewed and is understood.  Full responsibility of the confidentiality of this discharge information lies with you and/or   your care-partner. 

## 2019-10-29 ENCOUNTER — Telehealth: Payer: Self-pay

## 2019-10-29 NOTE — Telephone Encounter (Signed)
°  Follow up Call-  Call back number 10/25/2019  Post procedure Call Back phone  # [phone]  Permission to leave phone message Yes  Some recent data might be hidden     Patient questions:  Do you have a fever, pain , or abdominal swelling? No. Pain Score  0 *  Have you tolerated food without any problems? Yes.    Have you been able to return to your normal activities? Yes.    Do you have any questions about your discharge instructions: Diet   No. Medications  No. Follow up visit  No.  Do you have questions or concerns about your Care? No.  Actions: * If pain score is 4 or above: No action needed, pain <4.  1. Have you developed a fever since your procedure? no  2.   Have you had an respiratory symptoms (SOB or cough) since your procedure? no  3.   Have you tested positive for COVID 19 since your procedure no  4.   Have you had any family members/close contacts diagnosed with the COVID 19 since your procedure?  no   If yes to any of these questions please route to Joylene John, RN and Joella Prince, RN

## 2019-11-05 ENCOUNTER — Encounter: Payer: Self-pay | Admitting: Internal Medicine

## 2019-11-13 ENCOUNTER — Ambulatory Visit: Payer: BC Managed Care – PPO

## 2019-11-13 ENCOUNTER — Encounter: Payer: Self-pay | Admitting: Internal Medicine

## 2019-11-13 ENCOUNTER — Ambulatory Visit (INDEPENDENT_AMBULATORY_CARE_PROVIDER_SITE_OTHER): Payer: BC Managed Care – PPO

## 2019-11-13 ENCOUNTER — Ambulatory Visit (INDEPENDENT_AMBULATORY_CARE_PROVIDER_SITE_OTHER): Payer: BC Managed Care – PPO | Admitting: Internal Medicine

## 2019-11-13 ENCOUNTER — Other Ambulatory Visit: Payer: Self-pay

## 2019-11-13 VITALS — BP 106/70 | HR 51 | Temp 98.4°F | Ht 72.0 in | Wt 283.0 lb

## 2019-11-13 DIAGNOSIS — Z0000 Encounter for general adult medical examination without abnormal findings: Secondary | ICD-10-CM

## 2019-11-13 DIAGNOSIS — Z1329 Encounter for screening for other suspected endocrine disorder: Secondary | ICD-10-CM

## 2019-11-13 DIAGNOSIS — M545 Low back pain, unspecified: Secondary | ICD-10-CM

## 2019-11-13 DIAGNOSIS — R001 Bradycardia, unspecified: Secondary | ICD-10-CM

## 2019-11-13 DIAGNOSIS — Z125 Encounter for screening for malignant neoplasm of prostate: Secondary | ICD-10-CM

## 2019-11-13 DIAGNOSIS — N50811 Right testicular pain: Secondary | ICD-10-CM | POA: Diagnosis not present

## 2019-11-13 DIAGNOSIS — Z8042 Family history of malignant neoplasm of prostate: Secondary | ICD-10-CM

## 2019-11-13 DIAGNOSIS — Z1389 Encounter for screening for other disorder: Secondary | ICD-10-CM

## 2019-11-13 DIAGNOSIS — N442 Benign cyst of testis: Secondary | ICD-10-CM

## 2019-11-13 DIAGNOSIS — M4316 Spondylolisthesis, lumbar region: Secondary | ICD-10-CM | POA: Diagnosis not present

## 2019-11-13 DIAGNOSIS — Z13818 Encounter for screening for other digestive system disorders: Secondary | ICD-10-CM

## 2019-11-13 DIAGNOSIS — N50812 Left testicular pain: Secondary | ICD-10-CM

## 2019-11-13 DIAGNOSIS — I861 Scrotal varices: Secondary | ICD-10-CM

## 2019-11-13 DIAGNOSIS — M47817 Spondylosis without myelopathy or radiculopathy, lumbosacral region: Secondary | ICD-10-CM | POA: Diagnosis not present

## 2019-11-13 DIAGNOSIS — M47816 Spondylosis without myelopathy or radiculopathy, lumbar region: Secondary | ICD-10-CM | POA: Diagnosis not present

## 2019-11-13 DIAGNOSIS — M48061 Spinal stenosis, lumbar region without neurogenic claudication: Secondary | ICD-10-CM | POA: Diagnosis not present

## 2019-11-13 NOTE — Patient Instructions (Addendum)
Tylenol or Ibuprofen Lidocaine or Salonpas patch  voltaren gel    Testicular Self-Exam A self-examination of your testicles (testicular self-exam) involves looking at and feeling your testicles for abnormal lumps or swelling. Several things can cause swelling, lumps, or pain in your testicles. Some of these causes are:  Injuries.  Inflammation.  Infection.  Buildup of fluids around your testicle (hydrocele).  Twisted testicles (testicular torsion).  Testicular cancer. Why is it important to do a testicular self-exam? Self-examination of the testicles and the left and right groin areas may be recommended if you are at risk for testicular cancer. Your groin is where your lower abdomen meets your upper thighs. You may be at risk for testicular cancer if you have:  An undescended testicle (cryptorchidism).  A history of previous testicular cancer.  A family history of testicular cancer. How to do a testicular self-exam The testicles are easiest to examine after a warm bath or shower. They are more difficult to examine when you are cold. This is because the muscles attached to the testicles retract and pull them up higher or into the abdomen. A normal testicle is egg-shaped and feels firm. It is smooth and not tender. The spermatic cord can be felt as a firm, spaghetti-like cord at the back of your testicle. Look and feel for changes  Stand and hold your penis away from your body.  Look at each testicle to check for lumps or swelling.  Roll each testicle between your thumb and forefinger, feeling the entire testicle. Feel for: ? Lumps. ? Swelling. ? Discomfort.  Check the groin area between your abdomen and upper thighs on both sides of your body. Look and feel for any swelling or bumps that are tender. These could be enlarged lymph nodes. Contact a health care provider if:  You find any bumps or lumps, such as a small, hard, pea-sized lump.  You find swelling, pain, or  soreness.  You see or feel any other changes in your testicles. Summary  A self-examination of your testicles (testicular self-exam) involves looking at and feeling your testicles for any changes.  Self-examination of the testicles and the left and right groin areas may be recommended if you are at risk for testicular cancer.  You should check each of your testicles for lumps, swelling, or discomfort.  You should check for swelling or tender bumps in your groin area between your lower abdomen and upper thighs. This information is not intended to replace advice given to you by your health care provider. Make sure you discuss any questions you have with your health care provider. Document Revised: 05/03/2018 Document Reviewed: 12/07/2015 Elsevier Patient Education  [place] or Strain Rehab Ask your health care provider which exercises are safe for you. Do exercises exactly as told by your health care provider and adjust them as directed. It is normal to feel mild stretching, pulling, tightness, or discomfort as you do these exercises. Stop right away if you feel sudden pain or your pain gets worse. Do not begin these exercises until told by your health care provider. Stretching and range-of-motion exercises These exercises warm up your muscles and joints and improve the movement and flexibility of your back. These exercises also help to relieve pain, numbness, and tingling. Lumbar rotation  1. Lie on your back on a firm surface and bend your knees. 2. Straighten your arms out to your sides so each arm forms a 90-degree angle (right angle) with  a side of your body. 3. Slowly move (rotate) both of your knees to one side of your body until you feel a stretch in your lower back (lumbar). Try not to let your shoulders lift off the floor. 4. Hold this position for __________ seconds. 5. Tense your abdominal muscles and slowly move your knees back to the starting  position. 6. Repeat this exercise on the other side of your body. Repeat __________ times. Complete this exercise __________ times a day. Single knee to chest  1. Lie on your back on a firm surface with both legs straight. 2. Bend one of your knees. Use your hands to move your knee up toward your chest until you feel a gentle stretch in your lower back and buttock. ? Hold your leg in this position by holding on to the front of your knee. ? Keep your other leg as straight as possible. 3. Hold this position for __________ seconds. 4. Slowly return to the starting position. 5. Repeat with your other leg. Repeat __________ times. Complete this exercise __________ times a day. Prone extension on elbows  1. Lie on your abdomen on a firm surface (prone position). 2. Prop yourself up on your elbows. 3. Use your arms to help lift your chest up until you feel a gentle stretch in your abdomen and your lower back. ? This will place some of your body weight on your elbows. If this is uncomfortable, try stacking pillows under your chest. ? Your hips should stay down, against the surface that you are lying on. Keep your hip and back muscles relaxed. 4. Hold this position for __________ seconds. 5. Slowly relax your upper body and return to the starting position. Repeat __________ times. Complete this exercise __________ times a day. Strengthening exercises These exercises build strength and endurance in your back. Endurance is the ability to use your muscles for a long time, even after they get tired. Pelvic tilt This exercise strengthens the muscles that lie deep in the abdomen. 1. Lie on your back on a firm surface. Bend your knees and keep your feet flat on the floor. 2. Tense your abdominal muscles. Tip your pelvis up toward the ceiling and flatten your lower back into the floor. ? To help with this exercise, you may place a small towel under your lower back and try to push your back into the  towel. 3. Hold this position for __________ seconds. 4. Let your muscles relax completely before you repeat this exercise. Repeat __________ times. Complete this exercise __________ times a day. Alternating arm and leg raises  1. Get on your hands and knees on a firm surface. If you are on a hard floor, you may want to use padding, such as an exercise mat, to cushion your knees. 2. Line up your arms and legs. Your hands should be directly below your shoulders, and your knees should be directly below your hips. 3. Lift your left leg behind you. At the same time, raise your right arm and straighten it in front of you. ? Do not lift your leg higher than your hip. ? Do not lift your arm higher than your shoulder. ? Keep your abdominal and back muscles tight. ? Keep your hips facing the ground. ? Do not arch your back. ? Keep your balance carefully, and do not hold your breath. 4. Hold this position for __________ seconds. 5. Slowly return to the starting position. 6. Repeat with your right leg and your left arm. Repeat  __________ times. Complete this exercise __________ times a day. Abdominal set with straight leg raise  1. Lie on your back on a firm surface. 2. Bend one of your knees and keep your other leg straight. 3. Tense your abdominal muscles and lift your straight leg up, 4-6 inches (10-15 cm) off the ground. 4. Keep your abdominal muscles tight and hold this position for __________ seconds. ? Do not hold your breath. ? Do not arch your back. Keep it flat against the ground. 5. Keep your abdominal muscles tense as you slowly lower your leg back to the starting position. 6. Repeat with your other leg. Repeat __________ times. Complete this exercise __________ times a day. Single leg lower with bent knees 1. Lie on your back on a firm surface. 2. Tense your abdominal muscles and lift your feet off the floor, one foot at a time, so your knees and hips are bent in 90-degree angles  (right angles). ? Your knees should be over your hips and your lower legs should be parallel to the floor. 3. Keeping your abdominal muscles tense and your knee bent, slowly lower one of your legs so your toe touches the ground. 4. Lift your leg back up to return to the starting position. ? Do not hold your breath. ? Do not let your back arch. Keep your back flat against the ground. 5. Repeat with your other leg. Repeat __________ times. Complete this exercise __________ times a day. Posture and body mechanics Good posture and healthy body mechanics can help to relieve stress in your body's tissues and joints. Body mechanics refers to the movements and positions of your body while you do your daily activities. Posture is part of body mechanics. Good posture means:  Your spine is in its natural S-curve position (neutral).  Your shoulders are pulled back slightly.  Your head is not tipped forward. Follow these guidelines to improve your posture and body mechanics in your everyday activities. Standing   When standing, keep your spine neutral and your feet about hip width apart. Keep a slight bend in your knees. Your ears, shoulders, and hips should line up.  When you do a task in which you stand in one place for a long time, place one foot up on a stable object that is 2-4 inches (5-10 cm) high, such as a footstool. This helps keep your spine neutral. Sitting   When sitting, keep your spine neutral and keep your feet flat on the floor. Use a footrest, if necessary, and keep your thighs parallel to the floor. Avoid rounding your shoulders, and avoid tilting your head forward.  When working at a desk or a computer, keep your desk at a height where your hands are slightly lower than your elbows. Slide your chair under your desk so you are close enough to maintain good posture.  When working at a computer, place your monitor at a height where you are looking straight ahead and you do not have  to tilt your head forward or downward to look at the screen. Resting  When lying down and resting, avoid positions that are most painful for you.  If you have pain with activities such as sitting, bending, stooping, or squatting, lie in a position in which your body does not bend very much. For example, avoid curling up on your side with your arms and knees near your chest (fetal position).  If you have pain with activities such as standing for a long time or  reaching with your arms, lie with your spine in a neutral position and bend your knees slightly. Try the following positions: ? Lying on your side with a pillow between your knees. ? Lying on your back with a pillow under your knees. Lifting   When lifting objects, keep your feet at least shoulder width apart and tighten your abdominal muscles.  Bend your knees and hips and keep your spine neutral. It is important to lift using the strength of your legs, not your back. Do not lock your knees straight out.  Always ask for help to lift heavy or awkward objects. This information is not intended to replace advice given to you by your health care provider. Make sure you discuss any questions you have with your health care provider. Document Revised: 05/04/2018 Document Reviewed: 02/01/2018 Elsevier Patient Education  [place].  Back Exercises The following exercises strengthen the muscles that help to support the trunk and back. They also help to keep the lower back flexible. Doing these exercises can help to prevent back pain or lessen existing pain.  If you have back pain or discomfort, try doing these exercises 2-3 times each day or as told by your health care provider.  As your pain improves, do them once each day, but increase the number of times that you repeat the steps for each exercise (do more repetitions).  To prevent the recurrence of back pain, continue to do these exercises once each day or as told by your health  care provider. Do exercises exactly as told by your health care provider and adjust them as directed. It is normal to feel mild stretching, pulling, tightness, or discomfort as you do these exercises, but you should stop right away if you feel sudden pain or your pain gets worse. Exercises Single knee to chest Repeat these steps 3-5 times for each leg: 1. Lie on your back on a firm bed or the floor with your legs extended. 2. Bring one knee to your chest. Your other leg should stay extended and in contact with the floor. 3. Hold your knee in place by grabbing your knee or thigh with both hands and hold. 4. Pull on your knee until you feel a gentle stretch in your lower back or buttocks. 5. Hold the stretch for 10-30 seconds. 6. Slowly release and straighten your leg. Pelvic tilt Repeat these steps 5-10 times: 1. Lie on your back on a firm bed or the floor with your legs extended. 2. Bend your knees so they are pointing toward the ceiling and your feet are flat on the floor. 3. Tighten your lower abdominal muscles to press your lower back against the floor. This motion will tilt your pelvis so your tailbone points up toward the ceiling instead of pointing to your feet or the floor. 4. With gentle tension and even breathing, hold this position for 5-10 seconds. Cat-cow Repeat these steps until your lower back becomes more flexible: 1. Get into a hands-and-knees position on a firm surface. Keep your hands under your shoulders, and keep your knees under your hips. You may place padding under your knees for comfort. 2. Let your head hang down toward your chest. Contract your abdominal muscles and point your tailbone toward the floor so your lower back becomes rounded like the back of a cat. 3. Hold this position for 5 seconds. 4. Slowly lift your head, let your abdominal muscles relax and point your tailbone up toward the ceiling so your back  forms a sagging arch like the back of a cow. 5. Hold  this position for 5 seconds.  Press-ups Repeat these steps 5-10 times: 1. Lie on your abdomen (face-down) on the floor. 2. Place your palms near your head, about shoulder-width apart. 3. Keeping your back as relaxed as possible and keeping your hips on the floor, slowly straighten your arms to raise the top half of your body and lift your shoulders. Do not use your back muscles to raise your upper torso. You may adjust the placement of your hands to make yourself more comfortable. 4. Hold this position for 5 seconds while you keep your back relaxed. 5. Slowly return to lying flat on the floor.  Bridges Repeat these steps 10 times: 1. Lie on your back on a firm surface. 2. Bend your knees so they are pointing toward the ceiling and your feet are flat on the floor. Your arms should be flat at your sides, next to your body. 3. Tighten your buttocks muscles and lift your buttocks off the floor until your waist is at almost the same height as your knees. You should feel the muscles working in your buttocks and the back of your thighs. If you do not feel these muscles, slide your feet 1-2 inches farther away from your buttocks. 4. Hold this position for 3-5 seconds. 5. Slowly lower your hips to the starting position, and allow your buttocks muscles to relax completely. If this exercise is too easy, try doing it with your arms crossed over your chest. Abdominal crunches Repeat these steps 5-10 times: 1. Lie on your back on a firm bed or the floor with your legs extended. 2. Bend your knees so they are pointing toward the ceiling and your feet are flat on the floor. 3. Cross your arms over your chest. 4. Tip your chin slightly toward your chest without bending your neck. 5. Tighten your abdominal muscles and slowly raise your trunk (torso) high enough to lift your shoulder blades a tiny bit off the floor. Avoid raising your torso higher than that because it can put too much stress on your low back  and does not help to strengthen your abdominal muscles. 6. Slowly return to your starting position. Back lifts Repeat these steps 5-10 times: 1. Lie on your abdomen (face-down) with your arms at your sides, and rest your forehead on the floor. 2. Tighten the muscles in your legs and your buttocks. 3. Slowly lift your chest off the floor while you keep your hips pressed to the floor. Keep the back of your head in line with the curve in your back. Your eyes should be looking at the floor. 4. Hold this position for 3-5 seconds. 5. Slowly return to your starting position. Contact a health care provider if:  Your back pain or discomfort gets much worse when you do an exercise.  Your worsening back pain or discomfort does not lessen within 2 hours after you exercise. If you have any of these problems, stop doing these exercises right away. Do not do them again unless your health care provider says that you can. Get help right away if:  You develop sudden, severe back pain. If this happens, stop doing the exercises right away. Do not do them again unless your health care provider says that you can. This information is not intended to replace advice given to you by your health care provider. Make sure you discuss any questions you have with your health care provider.  Document Revised: 05/17/2018 Document Reviewed: 10/12/2017 Elsevier Patient Education  [place].

## 2019-11-13 NOTE — Progress Notes (Signed)
Chief Complaint  Patient presents with   Follow-up   F/u  1. C/w b/l testicle pain L>R x 2-3 weeks was 6-7/10 now 3-4/10 nothing tried m cousin had testicular cancer and wants to get checked out 2. Left lower back pain worse Saturday pm 2/10 today was 7-8/10 nothing tried he just took it easy this day and pain subsided  Pain does radiate down legs L>R no other alarm signs   Review of Systems  Constitutional: Positive for weight loss.       Scale at home 178 lbs  HENT: Negative for hearing loss.   Eyes: Negative for blurred vision.  Respiratory: Negative for shortness of breath.   Cardiovascular: Negative for chest pain.  Gastrointestinal: Negative for abdominal pain.  Genitourinary:       +testicular pain  Musculoskeletal: Positive for back pain.  Skin: Negative for rash.  Psychiatric/Behavioral: Negative for depression.   Past Medical History:  Diagnosis Date   Bradycardia    Chicken pox    GERD (gastroesophageal reflux disease)    Hiatal hernia    small noted 2013    Hyperlipidemia    Kidney stone    Premature atrial contractions    Premature ventricular contraction    Vitamin D deficiency    Wrist fracture    right    Past Surgical History:  Procedure Laterality Date   LASIK     2004    TONSILLECTOMY AND ADENOIDECTOMY  1978   Family History  Problem Relation Age of Onset   Cancer Mother        kidney cancer    Kidney disease Mother    Diabetes Mother    Hearing loss Mother    Heart disease Mother    Cancer Father        prostate, colon    Kidney disease Father    Diabetes Father    Hearing loss Father    Colon cancer Father 99   Colon polyps Father    Stroke Maternal Grandmother    Cancer Maternal Grandfather        bladder cancer, +smoker    Alcohol abuse Maternal Grandfather    Colon cancer Maternal Grandfather    Stomach cancer Other    Rectal cancer Other    Testicular cancer Cousin        M cousin    Esophageal cancer Neg Hx    Social History   Socioeconomic History   Marital status: Married    Spouse name: Not on file   Number of children: Not on file   Years of education: Not on file   Highest education level: Not on file  Occupational History   Not on file  Tobacco Use   Smoking status: Former Smoker   Smokeless tobacco: Former Systems developer    Types: Chew  Substance and Sexual Activity   Alcohol use: Not Currently    Comment: rare   Drug use: No   Sexual activity: Yes  Other Topics Concern   Not on file  Social History Narrative   Married [place]    1 daughter    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    Feels safe in relationship, wears seat belt, owns guns in home    Social Determinants of Health   Financial Resource Strain:    Difficulty of Paying Living Expenses: Not on file  Food Insecurity:    Worried About Running Out of  Food in the Last Year: Not on file   Ran Out of Food in the Last Year: Not on file  Transportation Needs:    Lack of Transportation (Medical): Not on file   Lack of Transportation (Non-Medical): Not on file  Physical Activity:    Days of Exercise per Week: Not on file   Minutes of Exercise per Session: Not on file  Stress:    Feeling of Stress : Not on file  Social Connections:    Frequency of Communication with Friends and Family: Not on file   Frequency of Social Gatherings with Friends and Family: Not on file   Attends Religious Services: Not on file   Active Member of Clubs or Organizations: Not on file   Attends Archivist Meetings: Not on file   Marital Status: Not on file  Intimate Partner Violence:    Fear of Current or Ex-Partner: Not on file   Emotionally Abused: Not on file   Physically Abused: Not on file   Sexually Abused: Not on file   Current Meds  Medication Sig   fluticasone (FLONASE) 50 MCG/ACT nasal spray Place 2  sprays into both nostrils daily.    Current Facility-Administered Medications for the 11/13/19 encounter (Office Visit) with McLean-Scocuzza, Nino Glow, MD  Medication   0.9 %  sodium chloride infusion   No Known Allergies No results found for this or any previous visit (from the past 2160 hour(s)). Objective  Body mass index is 38.38 kg/m. Wt Readings from Last 3 Encounters:  11/13/19 283 lb (128.4 kg)  10/25/19 298 lb (135.2 kg)  10/09/19 298 lb (135.2 kg)   Temp Readings from Last 3 Encounters:  11/13/19 98.4 F (36.9 C) (Oral)  10/25/19 97.8 F (36.6 C)  06/25/19 (!) 97.2 F (36.2 C) (Temporal)   BP Readings from Last 3 Encounters:  11/13/19 106/70  10/25/19 105/68  06/25/19 110/64   Pulse Readings from Last 3 Encounters:  11/13/19 (!) 51  10/25/19 (!) 55  06/25/19 (!) 55    Physical Exam Vitals reviewed.  Constitutional:      Appearance: Normal appearance. He is well-developed and well-groomed. He is obese.  HENT:     Head: Normocephalic and atraumatic.  Eyes:     Conjunctiva/sclera: Conjunctivae normal.     Pupils: Pupils are equal, round, and reactive to light.  Cardiovascular:     Rate and Rhythm: Normal rate and regular rhythm.     Heart sounds: Normal heart sounds. No murmur heard.   Pulmonary:     Effort: Pulmonary effort is normal.     Breath sounds: Normal breath sounds.  Genitourinary:    Testes:        Right: Tenderness present.        Left: Tenderness present.     Epididymis:     Right: Tenderness present.     Left: Tenderness present.  Skin:    General: Skin is warm and dry.  Neurological:     General: No focal deficit present.     Mental Status: He is alert and oriented to person, place, and time. Mental status is at baseline.     Gait: Gait normal.  Psychiatric:        Attention and Perception: Attention and perception normal.        Mood and Affect: Mood and affect normal.        Speech: Speech normal.        Behavior: Behavior  normal. Behavior is cooperative.  Thought Content: Thought content normal.        Cognition and Memory: Cognition and memory normal.        Judgment: Judgment normal.     Assessment  Plan  Pain in both testicles - Plan: US SCROTUM W/DOPPLER  Acute left-sided low back pain without sciatica - Plan: DG Lumbar Spine Complete Given exercise  rec tylenol/ibuprofen    HM-CPE at f/u in 2022 Fasting labs 05/2020 Declines flu shot, covid shot  Hep B immune rec mmr vaccine in future Tdaphad 06/05/16  Had 2/3 hep B vaccines immune as  Of 05/2019 FH prostate cancer in dad last PSA 0.89 05/2019-repeat PSAnormal Testosteronenormal 11/02/17 Colonoscopy 10/25/19 polyps sessile hyperplastic f/u 5 years   Former smoker quit 15-20 years ago smoker x 10 years 1/2 ppd max 1 ppd no FH lung cancer Former [place] as of 06/25/19  -Congratulated on quitting both rec healthy diet and exercise   Provider: Dr. Olivia Mackie McLean-Scocuzza-[facility]

## 2019-11-14 ENCOUNTER — Ambulatory Visit
Admission: RE | Admit: 2019-11-14 | Discharge: 2019-11-14 | Disposition: A | Discharge status: Home / Self Care | Payer: BC Managed Care – PPO | Source: Ambulatory Visit | Attending: Internal Medicine | Admitting: Internal Medicine

## 2019-11-14 DIAGNOSIS — N433 Hydrocele, unspecified: Secondary | ICD-10-CM | POA: Diagnosis not present

## 2019-11-14 DIAGNOSIS — N50811 Right testicular pain: Secondary | ICD-10-CM | POA: Diagnosis not present

## 2019-11-14 DIAGNOSIS — I861 Scrotal varices: Secondary | ICD-10-CM | POA: Diagnosis not present

## 2019-11-14 DIAGNOSIS — N50812 Left testicular pain: Secondary | ICD-10-CM | POA: Insufficient documentation

## 2019-11-14 DIAGNOSIS — N503 Cyst of epididymis: Secondary | ICD-10-CM | POA: Diagnosis not present

## 2019-11-18 DIAGNOSIS — N442 Benign cyst of testis: Secondary | ICD-10-CM | POA: Insufficient documentation

## 2019-11-18 DIAGNOSIS — I861 Scrotal varices: Secondary | ICD-10-CM | POA: Insufficient documentation

## 2019-11-18 DIAGNOSIS — Z8042 Family history of malignant neoplasm of prostate: Secondary | ICD-10-CM | POA: Insufficient documentation

## 2019-11-18 NOTE — Addendum Note (Signed)
Addended by: Orland Mustard on: 11/18/2019 10:15 PM   Modules accepted: Orders

## 2019-11-18 NOTE — Addendum Note (Signed)
Addended by: Orland Mustard on: 11/18/2019 10:21 PM   Modules accepted: Orders

## 2019-11-25 ENCOUNTER — Telehealth: Payer: Self-pay | Admitting: Internal Medicine

## 2019-11-25 NOTE — Telephone Encounter (Signed)
Rejection Reason - Patient did not respond - unable to reach patient to schedule" [place] said on Nov 25, 2019 2:34 PM

## 2019-11-26 NOTE — Telephone Encounter (Signed)
Good morning!  I sent referral to [facility] in [place]!

## 2019-11-26 NOTE — Telephone Encounter (Signed)
I referred to [facility] in [place]  Please resend   Thanks [facility]

## 2019-12-12 ENCOUNTER — Encounter: Payer: Self-pay | Admitting: Family Medicine

## 2019-12-12 ENCOUNTER — Ambulatory Visit (INDEPENDENT_AMBULATORY_CARE_PROVIDER_SITE_OTHER): Payer: BC Managed Care – PPO | Admitting: Family Medicine

## 2019-12-12 ENCOUNTER — Other Ambulatory Visit: Payer: Self-pay

## 2019-12-12 VITALS — BP 104/82 | HR 63 | Ht 72.0 in | Wt 287.0 lb

## 2019-12-12 DIAGNOSIS — M545 Low back pain, unspecified: Secondary | ICD-10-CM | POA: Diagnosis not present

## 2019-12-12 DIAGNOSIS — G8929 Other chronic pain: Secondary | ICD-10-CM | POA: Diagnosis not present

## 2019-12-12 DIAGNOSIS — M999 Biomechanical lesion, unspecified: Secondary | ICD-10-CM

## 2019-12-12 NOTE — Patient Instructions (Signed)
Exercises Ice 20 min 2x a day Pennsaid 2x a day See me in 6 weeks

## 2019-12-12 NOTE — Assessment & Plan Note (Addendum)
Appears to be a more of an exacerbation.  Recent x-rays were independently visualized and interpreted by Lyndal Pulley  Limited x-ray shows very mild degenerative disc disease at L1-L3 but otherwise fairly unremarkable discussed with patient about different medications which she declined at this moment.  Patient just wanted to feel better for his hunting trip.  Patient will come back again in 4 to 6 weeks.

## 2019-12-12 NOTE — Progress Notes (Signed)
[place] Phone: [phone] Subjective:   [name], am serving as a scribe for Dr. Hulan Saas. This visit occurred during the SARS-CoV-2 public health emergency.  Safety protocols were in place, including screening questions prior to the visit, additional usage of staff PPE, and extensive cleaning of exam room while observing appropriate contact time as indicated for disinfecting solutions.   I'm seeing this patient by the request  of:  McLean-Scocuzza, Nino Glow, MD  CC: Low back pain follow-up  YIA:[id number]  Roger [name] is a 49 y.o. male coming in with complaint of back pain. Last seen in 11/2018 for OMT. Patient states that his back pain is right sided and travels down the back of his leg for past 2 weeks. Does not take any medication for pain. Does stretch when he has pain.  Patient does not know exactly what seems to cause an aggravation at this time.  Has been doing relatively well and till recently.  Patient states some very mild intermittent radicular symptoms down the right leg.  No numbness or weakness noted.      Past Medical History:  Diagnosis Date  . Bradycardia   . Chicken pox   . GERD (gastroesophageal reflux disease)   . Hiatal hernia    small noted 2013   . Hyperlipidemia   . Kidney stone   . Premature atrial contractions   . Premature ventricular contraction   . Vitamin D deficiency   . Wrist fracture    right    Past Surgical History:  Procedure Laterality Date  . LASIK     2004   . TONSILLECTOMY AND ADENOIDECTOMY  1978   Social History   Socioeconomic History  . Marital status: Married    Spouse name: Not on file  . Number of children: Not on file  . Years of education: Not on file  . Highest education level: Not on file  Occupational History  . Not on file  Tobacco Use  . Smoking status: Former Research scientist (life sciences)  . Smokeless tobacco: Former Systems developer    Types: Chew    Substance and Sexual Activity  . Alcohol use: Not Currently    Comment: rare  . Drug use: No  . Sexual activity: Yes  Other Topics Concern  . 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    Feels safe in relationship, wears seat belt, owns guns in home    Social Determinants of Health   Financial Resource Strain:   . Difficulty of Paying Living Expenses: Not on file  Food Insecurity:   . Worried About Charity fundraiser in the Last Year: Not on file  . Ran Out of Food in the Last Year: Not on file  Transportation Needs:   . Lack of Transportation (Medical): Not on file  . Lack of Transportation (Non-Medical): Not on file  Physical Activity:   . Days of Exercise per Week: Not on file  . Minutes of Exercise per Session: Not on file  Stress:   . Feeling of Stress : Not on file  Social Connections:   . Frequency of Communication with Friends and Family: Not on file  . Frequency of Social Gatherings with Friends and Family: Not on file  . Attends Religious Services: Not  on file  . Active Member of Clubs or Organizations: Not on file  . Attends Archivist Meetings: Not on file  . Marital Status: Not on file   No Known Allergies Family History  Problem Relation Age of Onset  . Cancer Mother        kidney cancer   . Kidney disease Mother   . Diabetes Mother   . Hearing loss Mother   . Heart disease Mother   . Cancer Father        prostate, colon   . Kidney disease Father   . Diabetes Father   . Hearing loss Father   . Colon cancer Father 64  . Colon polyps Father   . Stroke Maternal Grandmother   . Cancer Maternal Grandfather        bladder cancer, +smoker   . Alcohol abuse Maternal Grandfather   . Colon cancer Maternal Grandfather   . Stomach cancer Other   . Rectal cancer Other   . Testicular cancer Cousin        M  cousin  . Esophageal cancer Neg Hx         Current Outpatient Medications (Respiratory):  .  fluticasone (FLONASE) 50 MCG/ACT nasal spray, Place 2 sprays into both nostrils daily.         Current Facility-Administered Medications (Other):  .  0.9 %  sodium chloride infusion   Reviewed prior external information including notes and imaging from  primary care provider As well as notes that were available from care everywhere and other healthcare systems.  Past medical history, social, surgical and family history all reviewed in electronic medical record.  No pertanent information unless stated regarding to the chief complaint.   Review of Systems:  No headache, visual changes, nausea, vomiting, diarrhea, constipation, dizziness, abdominal pain, skin rash, fevers, chills, night sweats, weight loss, swollen lymph nodes, body aches, joint swelling, chest pain, shortness of breath, mood changes. POSITIVE muscle aches  Objective  Blood pressure 104/82, pulse 63, height 6' (1.829 m), weight 287 lb (130.2 kg), SpO2 97 %.   General: No apparent distress alert and oriented x3 mood and affect normal, dressed appropriately.  HEENT: Pupils equal, extraocular movements intact  Respiratory: Patient's speak in full sentences and does not appear short of breath  Cardiovascular: No lower extremity edema, non tender, no erythema  Back exam does show some mild loss of lordosis.  Patient does have some mild tightness in the paraspinal musculature right greater than left.  Lacks the last 5 of extension in the last 10 of flexion.  Neurovascular intact distally.  No weakness noted.  5 out of 5 strength in lower extremities deep tendon reflexes are intact of the Achilles.  Osteopathic findings  T7 extended rotated and side bent left L4 flexed rotated and side bent right  Sacrum right on right     Impression and Recommendations:     The above documentation has been reviewed and is accurate  and complete Lyndal Pulley, DO

## 2019-12-12 NOTE — Assessment & Plan Note (Signed)

## 2019-12-25 ENCOUNTER — Ambulatory Visit: Payer: BC Managed Care – PPO | Admitting: Internal Medicine

## 2019-12-29 ENCOUNTER — Encounter: Payer: Self-pay | Admitting: Internal Medicine

## 2020-01-07 DIAGNOSIS — Z03818 Encounter for observation for suspected exposure to other biological agents ruled out: Secondary | ICD-10-CM | POA: Diagnosis not present

## 2020-01-07 DIAGNOSIS — Z20822 Contact with and (suspected) exposure to covid-19: Secondary | ICD-10-CM | POA: Diagnosis not present

## 2020-01-07 DIAGNOSIS — U071 COVID-19: Secondary | ICD-10-CM | POA: Diagnosis not present

## 2020-01-12 ENCOUNTER — Encounter: Payer: Self-pay | Admitting: Internal Medicine

## 2020-01-13 ENCOUNTER — Encounter: Payer: Self-pay | Admitting: Emergency Medicine

## 2020-01-13 ENCOUNTER — Other Ambulatory Visit: Payer: Self-pay

## 2020-01-13 ENCOUNTER — Telehealth: Payer: Self-pay | Admitting: Internal Medicine

## 2020-01-13 ENCOUNTER — Emergency Department
Admission: EM | Admit: 2020-01-13 | Discharge: 2020-01-14 | Disposition: A | Discharge status: Home / Self Care | Payer: BC Managed Care – PPO | Attending: Emergency Medicine | Admitting: Emergency Medicine

## 2020-01-13 ENCOUNTER — Emergency Department: Payer: BC Managed Care – PPO

## 2020-01-13 DIAGNOSIS — R0602 Shortness of breath: Secondary | ICD-10-CM

## 2020-01-13 DIAGNOSIS — Z87891 Personal history of nicotine dependence: Secondary | ICD-10-CM | POA: Insufficient documentation

## 2020-01-13 DIAGNOSIS — J189 Pneumonia, unspecified organism: Secondary | ICD-10-CM | POA: Diagnosis not present

## 2020-01-13 DIAGNOSIS — R7989 Other specified abnormal findings of blood chemistry: Secondary | ICD-10-CM | POA: Diagnosis not present

## 2020-01-13 DIAGNOSIS — I517 Cardiomegaly: Secondary | ICD-10-CM | POA: Diagnosis not present

## 2020-01-13 DIAGNOSIS — U071 COVID-19: Secondary | ICD-10-CM | POA: Insufficient documentation

## 2020-01-13 DIAGNOSIS — R9431 Abnormal electrocardiogram [ECG] [EKG]: Secondary | ICD-10-CM | POA: Diagnosis not present

## 2020-01-13 DIAGNOSIS — J90 Pleural effusion, not elsewhere classified: Secondary | ICD-10-CM | POA: Diagnosis not present

## 2020-01-13 LAB — BASIC METABOLIC PANEL
Anion gap: 12 (ref 5–15)
BUN: 11 mg/dL (ref 6–20)
CO2: 25 mmol/L (ref 22–32)
Calcium: 8.2 mg/dL — ABNORMAL LOW (ref 8.9–10.3)
Chloride: 99 mmol/L (ref 98–111)
Creatinine, Ser: 0.82 mg/dL (ref 0.61–1.24)
GFR, Estimated: >60 mL/min (ref >60)
Glucose, Bld: 111 mg/dL — ABNORMAL HIGH (ref 70–99)
Potassium: 3.4 mmol/L — ABNORMAL LOW (ref 3.5–5.1)
Sodium: 136 mmol/L (ref 135–145)

## 2020-01-13 LAB — CBC
HCT: 47.3 % (ref 39.0–52.0)
Hemoglobin: 16 g/dL (ref 13.0–17.0)
MCH: 29 pg (ref 26.0–34.0)
MCHC: 33.8 g/dL (ref 30.0–36.0)
MCV: 85.8 fL (ref 80.0–100.0)
Platelets: 130 10*3/uL — ABNORMAL LOW (ref 150–400)
RBC: 5.51 MIL/uL (ref 4.22–5.81)
RDW: 13.2 % (ref 11.5–15.5)
WBC: 5.5 10*3/uL (ref 4.0–10.5)
nRBC: 0 % (ref 0.0–0.2)

## 2020-01-13 LAB — FIBRIN DERIVATIVES D-DIMER (ARMC ONLY): Fibrin derivatives D-dimer (ARMC): 1938.33 ng/mL (FEU) — ABNORMAL HIGH (ref 0.00–499.00)

## 2020-01-13 LAB — TROPONIN I (HIGH SENSITIVITY)
Troponin I (High Sensitivity): 8 ng/L (ref <18)
Troponin I (High Sensitivity): 7 ng/L (ref <18)

## 2020-01-13 LAB — PROCALCITONIN: Procalcitonin: <0.1 ng/mL

## 2020-01-13 MED ORDER — DIPHENHYDRAMINE HCL 50 MG/ML IJ SOLN: 50.0000 mg | Freq: Once | INTRAMUSCULAR | Status: DC | PRN

## 2020-01-13 MED ORDER — IOHEXOL 350 MG/ML SOLN
100.0000 mL | Freq: Once | INTRAVENOUS | Status: AC | PRN
  Administered 2020-01-13: 100 mL via INTRAVENOUS

## 2020-01-13 MED ORDER — SODIUM CHLORIDE 0.9 % IV SOLN
1200.0000 mg | Freq: Once | INTRAVENOUS | Status: AC
  Administered 2020-01-13: 1200 mg via INTRAVENOUS
  Filled 2020-01-13: qty 10

## 2020-01-13 MED ORDER — SODIUM CHLORIDE 0.9 % IV SOLN: 1200.0000 mg | Freq: Once | INTRAVENOUS | Status: DC

## 2020-01-13 MED ORDER — SODIUM CHLORIDE 0.9 % IV SOLN: INTRAVENOUS | Status: DC | PRN

## 2020-01-13 MED ORDER — ACETAMINOPHEN 325 MG PO TABS
650.0000 mg | ORAL_TABLET | Freq: Once | ORAL | Status: AC
  Administered 2020-01-13: 650 mg via ORAL
  Filled 2020-01-13: qty 2

## 2020-01-13 MED ORDER — METHYLPREDNISOLONE SODIUM SUCC 125 MG IJ SOLR: 125.0000 mg | Freq: Once | INTRAMUSCULAR | Status: DC | PRN

## 2020-01-13 MED ORDER — EPINEPHRINE 0.3 MG/0.3ML IJ SOAJ: 0.3000 mg | Freq: Once | INTRAMUSCULAR | Status: DC | PRN

## 2020-01-13 MED ORDER — ALBUTEROL SULFATE HFA 108 (90 BASE) MCG/ACT IN AERS
2.0000 | INHALATION_SPRAY | Freq: Once | RESPIRATORY_TRACT | Status: DC | PRN
  Filled 2020-01-13: qty 6.7

## 2020-01-13 MED ORDER — FAMOTIDINE IN NACL 20-0.9 MG/50ML-% IV SOLN: 20.0000 mg | Freq: Once | INTRAVENOUS | Status: DC | PRN

## 2020-01-13 NOTE — ED Provider Notes (Signed)
[facility] Emergency Department Provider Note   ____________________________________________   Event Date/Time   First MD Initiated Contact with Patient 01/13/20 1357     (approximate)  I have reviewed the triage vital signs and the nursing notes.   HISTORY  Chief Complaint Covid Positive and Shortness of Breath  HPI Roger [name] is a 49 y.o. male patient reports he was diagnosed with Covid on the 14th of this month.  This morning he had a temperature of 101.  He is complaining of some pleuritic kind of chest pain worse with deep breathing left side of his chest.  It does not seem to radiate.  He has a dry cough.  In the emergency room his O2 sats are 93%.  He reports he feels like he cannot get enough air.         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   Pain in both testicles 11/13/2019   FH: colon cancer 06/25/2019   Bradycardia 01/03/2018   Low back pain 12/13/2017   Nonallopathic lesion of sacral region 12/13/2017   Vitamin D deficiency 06/09/2017   HLD (hyperlipidemia) 03/09/2017   Sinus bradycardia 03/09/2017   PVC's (premature ventricular contractions) 03/09/2017   Premature atrial contraction 03/09/2017   Vision problem 03/09/2017   GERD (gastroesophageal reflux disease) 04/16/2015   Obesity (BMI 30-39.9) 04/16/2015   Nonallopathic lesion of thoracic region 04/16/2015   Nonallopathic lesion of lumbosacral region 04/16/2015    Past Surgical History:  Procedure Laterality Date   LASIK      2004    TONSILLECTOMY AND ADENOIDECTOMY  1978    Prior to Admission medications   Medication Sig Start Date End Date Taking? Authorizing Provider  fluticasone (FLONASE) 50 MCG/ACT nasal spray Place 2 sprays into both nostrils daily.     [provider]    Allergies Patient has no known allergies.  Family History  Problem Relation Age of Onset   Cancer Mother        kidney cancer    Kidney disease Mother    Diabetes Mother    Hearing loss Mother    Heart disease Mother    Cancer Father        prostate, colon    Kidney disease Father    Diabetes Father    Hearing loss Father    Colon cancer Father 43   Colon polyps Father    Stroke Maternal Grandmother    Cancer Maternal Grandfather        bladder cancer, +smoker    Alcohol abuse Maternal Grandfather    Colon cancer Maternal Grandfather    Stomach cancer Other    Rectal cancer Other    Testicular cancer Cousin        M cousin   Esophageal cancer Neg Hx     Social History Social History   Tobacco Use   Smoking status: Former Smoker   Smokeless tobacco: Former Systems developer    Types:  Chew  Substance Use Topics   Alcohol use: Not Currently    Comment: rare   Drug use: No    Review of Systems  Constitutional: No fever/chills Eyes: No visual changes. ENT: No sore throat. Cardiovascular: Denies chest pain. Respiratory: shortness of breath. Gastrointestinal: No abdominal pain.  No nausea, no vomiting.  No diarrhea.  No constipation. Genitourinary: Negative for dysuria. Musculoskeletal: Negative for back pain. Skin: Negative for rash. Neurological: Negative for headaches, focal weakness  ____________________________________________   PHYSICAL EXAM:  VITAL SIGNS: ED Triage Vitals  Enc Vitals Group     BP 01/13/20 0816 134/84     Pulse Rate 01/13/20 0816 83     Resp 01/13/20 0816 (!) 24     Temp 01/13/20 0816 100.2 F (37.9 C)     Temp Source 01/13/20 0816 Oral     SpO2  01/13/20 0816 97 %     Weight 01/13/20 0817 273 lb (123.8 kg)     Height 01/13/20 0817 6\' 7"  (2.007 m)     Head Circumference --      Peak Flow --      Pain Score 01/13/20 0817 7     Pain Loc --      Pain Edu? --      Excl. in [place]? --     Constitutional: Alert and oriented.  Appears to be slightly short of breath Eyes: Conjunctivae are normal.  Head: Atraumatic. Nose: No congestion/rhinnorhea. Mouth/Throat: Mucous membranes are moist.  Oropharynx non-erythematous. Neck: No stridor. Cardiovascular: Normal rate, regular rhythm. Grossly normal heart sounds.  Good peripheral circulation. Respiratory: Normal respiratory effort.  No retractions. Lungs CTAB. Gastrointestinal: Soft and nontender. No distention. No abdominal bruits.  Musculoskeletal: No lower extremity tenderness nor edema.   Neurologic:  Normal speech and language. No gross focal neurologic deficits are appreciated.  Skin:  Skin is warm, dry and intact. No rash noted.  ____________________________________________   LABS (all labs ordered are listed, but only abnormal results are displayed)  Labs Reviewed  BASIC METABOLIC PANEL - Abnormal; Notable for the following components:      Result Value   Potassium 3.4 (*)    Glucose, Bld 111 (*)    Calcium 8.2 (*)    All other components within normal limits  CBC - Abnormal; Notable for the following components:   Platelets 130 (*)    All other components within normal limits  FIBRIN DERIVATIVES D-DIMER ([facility] ONLY) - Abnormal; Notable for the following components:   Fibrin derivatives D-dimer [facility]) 2,297.98 (*)    All other components within normal limits  PROCALCITONIN  TROPONIN I (HIGH SENSITIVITY)  TROPONIN I (HIGH SENSITIVITY)   ____________________________________________  EKG  EKG read interpreted by me shows normal sinus rhythm rate of 87 normal axis no acute changes ____________________________________________  RADIOLOGY Gertha Calkin, personally  viewed and evaluated these images (plain radiographs) as part of my medical decision making, as well as reviewing the written report by the radiologist.  ED MD interpretation: Chest x-ray read by radiology reviewed by me shows patchy bibasilar airspace disease compatible with pneumonia but it is very mild. CT read by radiology as no PE although suboptimal study.  I did review the films and agree. Official radiology report(s): DG Chest 2 View  Result Date: 01/13/2020 CLINICAL DATA:  COVID positive. EXAM: CHEST - 2 VIEW COMPARISON:  None. FINDINGS: Patchy airspace opacities are present both lung bases. No significant consolidation is present. No edema or effusion is present.  Heart size is normal. IMPRESSION: Patchy bibasilar airspace disease compatible with pneumonia. Electronically Signed   By: San Morelle M.D.   On: 01/13/2020 09:11   CT Angio Chest PE W and/or Wo Contrast  Result Date: 01/13/2020 CLINICAL DATA:  Positive D-dimer.  PE suspected. EXAM: CT ANGIOGRAPHY CHEST WITH CONTRAST TECHNIQUE: Multidetector CT imaging of the chest was performed using the standard protocol during bolus administration of intravenous contrast. Multiplanar CT image reconstructions and MIPs were obtained to evaluate the vascular anatomy. CONTRAST:  177mL OMNIPAQUE IOHEXOL 350 MG/ML SOLN COMPARISON:  None. FINDINGS: Cardiovascular: Evaluation is limited by a suboptimal contrast bolus timing in addition to respiratory motion artifact. Within that limitation, there is no central pulmonary embolus. The size of the main pulmonary artery is normal. Mild cardiomegaly. The course and caliber of the aorta are normal. There is no atherosclerotic calcification. Opacification decreased due to pulmonary arterial phase contrast bolus timing. Mediastinum/Nodes: -- No mediastinal lymphadenopathy. -- No hilar lymphadenopathy. -- No axillary lymphadenopathy. -- No supraclavicular lymphadenopathy. -- Normal thyroid gland where  visualized. -  Unremarkable esophagus. Lungs/Pleura: Bilateral ground-glass airspace opacities are noted, greatest within the bilateral lower lobes. There is no pneumothorax or large pleural effusion. The trachea is unremarkable. Upper Abdomen: Contrast bolus timing is not optimized for evaluation of the abdominal organs. The visualized portions of the organs of the upper abdomen are normal. Musculoskeletal: No chest wall abnormality. No bony spinal canal stenosis. Review of the MIP images confirms the above findings. IMPRESSION: 1. Evaluation is limited by a suboptimal contrast bolus timing in addition to respiratory motion artifact. Within that limitation, there is no central pulmonary embolus. 2. Bilateral ground-glass airspace opacities, greatest within the bilateral lower lobes, concerning for an atypical infectious process such as viral pneumonia. 3. Mild cardiomegaly. Electronically Signed   By: Constance Holster M.D.   On: 01/13/2020 16:33    ____________________________________________   PROCEDURES  Procedure(s) performed (including Critical Care):  Procedures   ____________________________________________   INITIAL IMPRESSION / ASSESSMENT AND PLAN / ED COURSE  Patient was not hypoxic and we could not get him to be hypoxic however he was very tachypneic when he walked. We gave him the antibody infusion which was actually the last dose of infusible outpatient antibodies available in the hospital. [facility] did not feel he was a candidate because they were very short of infusion and had limited dispensing medication to 7 days after diagnosis.  Patient did not meet these criteria.              ____________________________________________   FINAL CLINICAL IMPRESSION(S) / ED DIAGNOSES  Final diagnoses:  SOB (shortness of breath)  COVID     ED Discharge Orders    None      *Please note:  Roger [name] was evaluated in Emergency Department on 01/13/2020 for the  symptoms described in the history of present illness. He was evaluated in the context of the global COVID-19 pandemic, which necessitated consideration that the patient might be at risk for infection with the SARS-CoV-2 virus that causes COVID-19. Institutional protocols and algorithms that pertain to the evaluation of patients at risk for COVID-19 are in a state of rapid change based on information released by regulatory bodies including the CDC and federal and state organizations. These policies and algorithms were followed during the patient's care in the ED.  Some ED evaluations and interventions may be delayed as a result of limited staffing during and the pandemic.*   Note:  This document was prepared using Dragon voice recognition software and may include unintentional dictation errors.    Nena Polio, MD 01/13/20 2112

## 2020-01-13 NOTE — ED Triage Notes (Signed)
Pt to ED via [facility] with c/o shortness of breath. Pt states dx with covid 12/14. Pt states temp this morning 101 at home. Pt able to speak in full sentences with mild difficulty. RA sats on arrival 100%.   Pt also c/o back pain and CP at this time. Pt states CP x several days. Pt states pain to L side of his chest, denies radiation at this time.

## 2020-01-13 NOTE — Telephone Encounter (Signed)
Call pt  Advise if at th hospital and SOB should stay there  -call infusion line in [place] for wife melinda Welling (also a pt) and patient Nicholson Inscoe  Dad is in the hospital   Or if cant wait infusion line to call them back go to [facility] to be seen and evaluated and set up for infusion    There is no medication other than over the counter meds: Mucinex dm green label for cough. Vitamin C 1000 mg daily. Vitamin D3 4000 Iu (units) daily. Zinc 100 mg daily. Quercetin 250-500 mg 2 times per day  Monitor pulse ox, buy from [facility] if oxygen is less than 90 please go to the hospital.     Are you feeling really sick? Shortness of breath, cough, chest pain? Dizziness/confusion   If so let me know  If worsening any of the above, go to hospital to be admitted

## 2020-01-13 NOTE — ED Notes (Signed)
Pt has no complaints at this time  

## 2020-01-13 NOTE — Discharge Instructions (Addendum)
Please return for increasing shortness of breath higher fever or feeling worse.  This may still happen even though you got the infusion.

## 2020-01-14 NOTE — Telephone Encounter (Signed)
See 01/12/20 Patient message encounter

## 2020-01-19 ENCOUNTER — Encounter: Payer: Self-pay | Admitting: Internal Medicine

## 2020-01-22 ENCOUNTER — Other Ambulatory Visit: Payer: Self-pay

## 2020-01-22 ENCOUNTER — Telehealth (INDEPENDENT_AMBULATORY_CARE_PROVIDER_SITE_OTHER): Payer: BC Managed Care – PPO | Admitting: Internal Medicine

## 2020-01-22 ENCOUNTER — Encounter: Payer: Self-pay | Admitting: Internal Medicine

## 2020-01-22 VITALS — Ht 79.0 in | Wt 271.0 lb

## 2020-01-22 DIAGNOSIS — R059 Cough, unspecified: Secondary | ICD-10-CM

## 2020-01-22 DIAGNOSIS — J1282 Pneumonia due to coronavirus disease 2019: Secondary | ICD-10-CM

## 2020-01-22 DIAGNOSIS — U071 COVID-19: Secondary | ICD-10-CM

## 2020-01-22 MED ORDER — DEXAMETHASONE 6 MG PO TABS: 6.0000 mg | ORAL_TABLET | Freq: Every day | ORAL | 0 refills | Status: DC

## 2020-01-22 MED ORDER — ALBUTEROL SULFATE HFA 108 (90 BASE) MCG/ACT IN AERS: 2.0000 | INHALATION_SPRAY | Freq: Four times a day (QID) | RESPIRATORY_TRACT | 0 refills | Status: DC | PRN

## 2020-01-22 MED ORDER — AZITHROMYCIN 250 MG PO TABS: ORAL_TABLET | ORAL | 0 refills | Status: DC

## 2020-01-22 MED ORDER — HYDROCOD POLST-CPM POLST ER 10-8 MG/5ML PO SUER
5.0000 mL | Freq: Two times a day (BID) | ORAL | 0 refills | Status: DC | PRN
Start: 2020-01-22 — End: 2020-02-12

## 2020-01-22 NOTE — Progress Notes (Signed)
Virtual Visit via Video Note  I connected with Roger [name]  on 01/22/20 at  8:20 AM EST by a video enabled telemedicine application and verified that I am speaking with the correct person using two identifiers.  Location patient: home, [place] Location provider:work or home office Persons participating in the virtual visit: patient, provider, pts wife Rip Harbour   I discussed the limitations of evaluation and management by telemedicine and the availability of in person appointments. The patient expressed understanding and agreed to proceed.   HPI: 01/04/20 after church Xmas dinner and had sx's 01/06/20 Monday +covid 01/13/20 with CTA chest b/l opacities +, mild cardiomegaly his dad died his wife, 1/2 twin sisters also had covid and doing better. He lost weight down to 269 lbs,n/v/d+ but resolved also had fever, chills, body aches.still having persistent h/a. His O2 with exertion 84%. S/p [facility] infusion. Feeling somewhat better still with cough  ROS: See pertinent positives and negatives per HPI.  Past Medical History:  Diagnosis Date  . Bradycardia   . Chicken pox   . GERD (gastroesophageal reflux disease)   . Hiatal hernia    small noted 2013   . Hyperlipidemia   . Kidney stone   . Premature atrial contractions   . Premature ventricular contraction   . Vitamin D deficiency   . Wrist fracture    right     Past Surgical History:  Procedure Laterality Date  . LASIK     2004   . TONSILLECTOMY AND ADENOIDECTOMY  1978     Current Outpatient Medications:  .  albuterol (VENTOLIN HFA) 108 (90 Base) MCG/ACT inhaler, Inhale 2 puffs into the lungs every 6 (six) hours as needed for wheezing or shortness of breath., Disp: 18 g, Rfl: 0 .  azithromycin (ZITHROMAX) 250 MG tablet, 2 pills day 1 and 1 pill day 2-5 with food, Disp: 6 tablet, Rfl: 0 .  chlorpheniramine-HYDROcodone (TUSSIONEX PENNKINETIC ER) 10-8 MG/5ML SUER, Take 5 mLs by mouth every 12 (twelve) hours as needed., Disp: 115 mL, Rfl: 0 .   dexamethasone (DECADRON) 6 MG tablet, Take 1 tablet (6 mg total) by mouth daily. X 5-10 days, Disp: 10 tablet, Rfl: 0 .  fluticasone (FLONASE) 50 MCG/ACT nasal spray, Place 2 sprays into both nostrils daily.  (Patient not taking: Reported on 01/22/2020), Disp: , Rfl:   Current Facility-Administered Medications:  .  0.9 %  sodium chloride infusion, 500 mL, Intravenous, Once, Pyrtle, Lajuan Lines, MD  EXAM:  VITALS per patient if applicable:  GENERAL: alert, oriented, appears well and in no acute distress  HEENT: atraumatic, conjunttiva clear, no obvious abnormalities on inspection of external nose and ears  NECK: normal movements of the head and neck  LUNGS: on inspection no signs of respiratory distress, breathing rate appears normal, no obvious gross SOB, gasping or wheezing  CV: no obvious cyanosis  MS: moves all visible extremities without noticeable abnormality  PSYCH/NEURO: pleasant and cooperative, no obvious depression or anxiety, speech and thought processing grossly intact  ASSESSMENT AND PLAN:  Discussed the following assessment and plan:  Pneumonia due to COVID-19 virus - Plan: chlorpheniramine-HYDROcodone (TUSSIONEX PENNKINETIC ER) 10-8 MG/5ML SUER, dexamethasone (DECADRON) 6 MG tablet, albuterol (VENTOLIN HFA) 108 (90 Base) MCG/ACT inhaler, azithromycin (ZITHROMAX) 250 MG tablet  Cough - Plan: chlorpheniramine-HYDROcodone (TUSSIONEX PENNKINETIC ER) 10-8 MG/5ML SUER, dexamethasone (DECADRON) 6 MG tablet, albuterol (VENTOLIN HFA) 108 (90 Base) MCG/ACT inhaler, azithromycin (ZITHROMAX) 250 MG tablet Vitamins  Purchase pulse Ox [employer] O2 goal >90%  Prn Tylenol   -  we discussed possible serious and likely etiologies, options for evaluation and workup, limitations of telemedicine visit vs in person visit, treatment, treatment risks and precautions. I discussed the assessment and treatment plan with the patient. The patient was provided an opportunity to ask questions and all were  answered. The patient agreed with the plan and demonstrated an understanding of the instructions.    Time 20 min Bevelyn Buckles, MD

## 2020-01-29 ENCOUNTER — Ambulatory Visit: Payer: BC Managed Care – PPO | Admitting: Family Medicine

## 2020-01-29 NOTE — Progress Notes (Deleted)
Roger [name] Roger [name] Phone: [phone] Subjective:    I'm seeing this patient by the request  of:  McLean-Scocuzza, Nino Glow, MD  CC: low back pain   [id number]  Roger [name] is a 50 y.o. male coming in with complaint of ***  Onset-  Location Duration-  Character- Aggravating factors- Reliving factors-  Therapies tried-  Severity-   xrays of lumbar 11/14/2019 Moderate DDD from L3-S1 and facet arthrorisis L4-S1  Past Medical History:  Diagnosis Date  . Bradycardia   . Chicken pox   . GERD (gastroesophageal reflux disease)   . Hiatal hernia    small noted 2013   . Hyperlipidemia   . Kidney stone   . Premature atrial contractions   . Premature ventricular contraction   . Vitamin D deficiency   . Wrist fracture    right    Past Surgical History:  Procedure Laterality Date  . LASIK     2004   . TONSILLECTOMY AND ADENOIDECTOMY  1978   Social History   Socioeconomic History  . Marital status: Married    Spouse name: Not on file  . Number of children: Not on file  . Years of education: Not on file  . Highest education level: Not on file  Occupational History  . Not on file  Tobacco Use  . Smoking status: Former Research scientist (life sciences)  . Smokeless tobacco: Former Systems developer    Types: Chew  Substance and Sexual Activity  . Alcohol use: Not Currently    Comment: rare  . Drug use: No  . Sexual activity: Yes  Other Topics Concern  . 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    Feels safe in relationship, wears seat belt, owns guns in home    Social Determinants of Health   Financial Resource Strain: Not on file  Food Insecurity: Not on file  Transportation Needs: Not on file  Physical Activity: Not on file  Stress: Not on file  Social Connections: Not on file   No Known  Allergies Family History  Problem Relation Age of Onset  . Cancer Mother        kidney cancer   . Kidney disease Mother   . Diabetes Mother   . Hearing loss Mother   . Heart disease Mother   . Cancer Father        prostate, colon   . Kidney disease Father   . Diabetes Father   . Hearing loss Father   . Colon cancer Father 64  . Colon polyps Father   . Stroke Maternal Grandmother   . Cancer Maternal Grandfather        bladder cancer, +smoker   . Alcohol abuse Maternal Grandfather   . Colon cancer Maternal Grandfather   . Stomach cancer Other   . Rectal cancer Other   . Testicular cancer Cousin        M cousin  . Esophageal cancer Neg Hx     Current Outpatient Medications (Endocrine & Metabolic):  .  dexamethasone (DECADRON) 6 MG tablet, Take 1 tablet (6 mg total) by mouth daily. X 5-10 days     Current Outpatient Medications (Respiratory):  .  albuterol (VENTOLIN HFA) 108 (90 Base) MCG/ACT inhaler, Inhale 2 puffs into the lungs every 6 (six)  hours as needed for wheezing or shortness of breath. .  chlorpheniramine-HYDROcodone (TUSSIONEX PENNKINETIC ER) 10-8 MG/5ML SUER, Take 5 mLs by mouth every 12 (twelve) hours as needed. .  fluticasone (FLONASE) 50 MCG/ACT nasal spray, Place 2 sprays into both nostrils daily.  (Patient not taking: Reported on 01/22/2020)       Current Outpatient Medications (Other):  .  azithromycin (ZITHROMAX) 250 MG tablet, 2 pills day 1 and 1 pill day 2-5 with food  Current Facility-Administered Medications (Other):  .  0.9 %  sodium chloride infusion   Reviewed prior external information including notes and imaging from  primary care provider As well as notes that were available from care everywhere and other healthcare systems.  Past medical history, social, surgical and family history all reviewed in electronic medical record.  No pertanent information unless stated regarding to the chief complaint.   Review of Systems:  No headache,  visual changes, nausea, vomiting, diarrhea, constipation, dizziness, abdominal pain, skin rash, fevers, chills, night sweats, weight loss, swollen lymph nodes, body aches, joint swelling, chest pain, shortness of breath, mood changes. POSITIVE muscle aches  Objective  There were no vitals taken for this visit.   General: No apparent distress alert and oriented x3 mood and affect normal, dressed appropriately.  HEENT: Pupils equal, extraocular movements intact  Respiratory: Patient's speak in full sentences and does not appear short of breath  Cardiovascular: No lower extremity edema, non tender, no erythema  Neuro: Cranial nerves II through XII are intact, neurovascularly intact in all extremities with 2+ DTRs and 2+ pulses.  Gait normal with good balance and coordination.  MSK:  Non tender with full range of motion and good stability and symmetric strength and tone of shoulders, elbows, wrist, hip, knee and ankles bilaterally.     Impression and Recommendations:     The above documentation has been reviewed and is accurate and complete Judi Saa, DO

## 2020-02-03 ENCOUNTER — Encounter: Payer: Self-pay | Admitting: Internal Medicine

## 2020-02-07 ENCOUNTER — Other Ambulatory Visit: Payer: Self-pay

## 2020-02-07 ENCOUNTER — Other Ambulatory Visit (INDEPENDENT_AMBULATORY_CARE_PROVIDER_SITE_OTHER): Payer: BC Managed Care – PPO

## 2020-02-07 DIAGNOSIS — Z1389 Encounter for screening for other disorder: Secondary | ICD-10-CM | POA: Diagnosis not present

## 2020-02-07 DIAGNOSIS — R001 Bradycardia, unspecified: Secondary | ICD-10-CM | POA: Diagnosis not present

## 2020-02-07 DIAGNOSIS — Z125 Encounter for screening for malignant neoplasm of prostate: Secondary | ICD-10-CM

## 2020-02-07 DIAGNOSIS — Z1329 Encounter for screening for other suspected endocrine disorder: Secondary | ICD-10-CM

## 2020-02-07 DIAGNOSIS — Z0000 Encounter for general adult medical examination without abnormal findings: Secondary | ICD-10-CM

## 2020-02-07 DIAGNOSIS — Z13818 Encounter for screening for other digestive system disorders: Secondary | ICD-10-CM

## 2020-02-07 LAB — COMPREHENSIVE METABOLIC PANEL
ALT: 35 U/L (ref 0–53)
AST: 22 U/L (ref 0–37)
Albumin: 4.2 g/dL (ref 3.5–5.2)
Alkaline Phosphatase: 64 U/L (ref 39–117)
BUN: 8 mg/dL (ref 6–23)
CO2: 27 mEq/L (ref 19–32)
Calcium: 9.2 mg/dL (ref 8.4–10.5)
Chloride: 105 mEq/L (ref 96–112)
Creatinine, Ser: 0.83 mg/dL (ref 0.40–1.50)
GFR: 102.69 mL/min (ref >60.00)
Glucose, Bld: 86 mg/dL (ref 70–99)
Potassium: 4.4 mEq/L (ref 3.5–5.1)
Sodium: 141 mEq/L (ref 135–145)
Total Bilirubin: 0.7 mg/dL (ref 0.2–1.2)
Total Protein: 6.5 g/dL (ref 6.0–8.3)

## 2020-02-07 LAB — LIPID PANEL
Cholesterol: 223 mg/dL — ABNORMAL HIGH (ref 0–200)
HDL: 40.3 mg/dL (ref >39.00)
LDL Cholesterol: 152 mg/dL — ABNORMAL HIGH (ref 0–99)
NonHDL: 182.56
Total CHOL/HDL Ratio: 6
Triglycerides: 152 mg/dL — ABNORMAL HIGH (ref 0.0–149.0)
VLDL: 30.4 mg/dL (ref 0.0–40.0)

## 2020-02-07 LAB — CBC WITH DIFFERENTIAL/PLATELET
Basophils Absolute: 0 10*3/uL (ref 0.0–0.1)
Basophils Relative: 0.6 % (ref 0.0–3.0)
Eosinophils Absolute: 0.3 10*3/uL (ref 0.0–0.7)
Eosinophils Relative: 4.1 % (ref 0.0–5.0)
HCT: 44.9 % (ref 39.0–52.0)
Hemoglobin: 14.9 g/dL (ref 13.0–17.0)
Lymphocytes Relative: 27.3 % (ref 12.0–46.0)
Lymphs Abs: 1.7 10*3/uL (ref 0.7–4.0)
MCHC: 33.2 g/dL (ref 30.0–36.0)
MCV: 87.7 fl (ref 78.0–100.0)
Monocytes Absolute: 0.8 10*3/uL (ref 0.1–1.0)
Monocytes Relative: 13.6 % — ABNORMAL HIGH (ref 3.0–12.0)
Neutro Abs: 3.4 10*3/uL (ref 1.4–7.7)
Neutrophils Relative %: 54.4 % (ref 43.0–77.0)
Platelets: 172 10*3/uL (ref 150.0–400.0)
RBC: 5.13 Mil/uL (ref 4.22–5.81)
RDW: 15.5 % (ref 11.5–15.5)
WBC: 6.2 10*3/uL (ref 4.0–10.5)

## 2020-02-07 LAB — PSA: PSA: 2.89 ng/mL (ref 0.10–4.00)

## 2020-02-07 LAB — TSH: TSH: 1.48 u[IU]/mL (ref 0.35–4.50)

## 2020-02-08 LAB — URINALYSIS, ROUTINE W REFLEX MICROSCOPIC
Bilirubin Urine: NEGATIVE
Glucose, UA: NEGATIVE
Hgb urine dipstick: NEGATIVE
Ketones, ur: NEGATIVE
Leukocytes,Ua: NEGATIVE
Nitrite: NEGATIVE
Protein, ur: NEGATIVE
Specific Gravity, Urine: 1.015 (ref 1.001–1.03)
pH: 6.5 (ref 5.0–8.0)

## 2020-02-11 LAB — HEPATITIS C ANTIBODY
Hepatitis C Ab: NONREACTIVE
SIGNAL TO CUT-OFF: 0.01 (ref <1.00)

## 2020-02-12 ENCOUNTER — Ambulatory Visit (INDEPENDENT_AMBULATORY_CARE_PROVIDER_SITE_OTHER): Payer: BC Managed Care – PPO | Admitting: Internal Medicine

## 2020-02-12 ENCOUNTER — Ambulatory Visit (INDEPENDENT_AMBULATORY_CARE_PROVIDER_SITE_OTHER): Payer: BC Managed Care – PPO

## 2020-02-12 ENCOUNTER — Encounter: Payer: Self-pay | Admitting: Internal Medicine

## 2020-02-12 ENCOUNTER — Other Ambulatory Visit: Payer: Self-pay

## 2020-02-12 VITALS — BP 122/82 | HR 56 | Temp 98.5°F | Ht 72.01 in | Wt 289.8 lb

## 2020-02-12 DIAGNOSIS — J9801 Acute bronchospasm: Secondary | ICD-10-CM

## 2020-02-12 DIAGNOSIS — U071 COVID-19: Secondary | ICD-10-CM

## 2020-02-12 DIAGNOSIS — R0602 Shortness of breath: Secondary | ICD-10-CM

## 2020-02-12 DIAGNOSIS — J1282 Pneumonia due to coronavirus disease 2019: Secondary | ICD-10-CM

## 2020-02-12 DIAGNOSIS — R059 Cough, unspecified: Secondary | ICD-10-CM

## 2020-02-12 DIAGNOSIS — Z8042 Family history of malignant neoplasm of prostate: Secondary | ICD-10-CM

## 2020-02-12 DIAGNOSIS — D72821 Monocytosis (symptomatic): Secondary | ICD-10-CM

## 2020-02-12 DIAGNOSIS — J189 Pneumonia, unspecified organism: Secondary | ICD-10-CM | POA: Diagnosis not present

## 2020-02-12 DIAGNOSIS — E785 Hyperlipidemia, unspecified: Secondary | ICD-10-CM

## 2020-02-12 DIAGNOSIS — Z0000 Encounter for general adult medical examination without abnormal findings: Secondary | ICD-10-CM | POA: Diagnosis not present

## 2020-02-12 DIAGNOSIS — F4321 Adjustment disorder with depressed mood: Secondary | ICD-10-CM

## 2020-02-12 DIAGNOSIS — R972 Elevated prostate specific antigen [PSA]: Secondary | ICD-10-CM

## 2020-02-12 DIAGNOSIS — R918 Other nonspecific abnormal finding of lung field: Secondary | ICD-10-CM

## 2020-02-12 MED ORDER — ALBUTEROL SULFATE HFA 108 (90 BASE) MCG/ACT IN AERS: 2.0000 | INHALATION_SPRAY | Freq: Four times a day (QID) | RESPIRATORY_TRACT | 11 refills | Status: DC | PRN

## 2020-02-12 NOTE — Progress Notes (Signed)
Patient will receive at appointment today

## 2020-02-12 NOTE — Progress Notes (Signed)
Chief Complaint  Patient presents with  . Shortness of Breath    Pt c/o body fatigue and SOB after covid positive   Annual 1. F/u covid 19 01/07/20 + [facility] s/p MAB infusion 01/13/20 [facility] ED +cxr covid pneumonia still having sob with exertion though better and physical deconditioning weakness he was in the bed x 3 weeks and feels weak at times with exertion  Not using albuterol inhaler   2. Reviewed labs 02/07/20 PSA increased will refer to urology [place] prostate cancer dad and mGF  Results for TATERayvon, Dakin (MRN [id number]) as of 02/12/2020 16:50 05/29/2017 08:02 PSA: 0.89 06/21/2019 08:26 PSA: 0.81 02/07/2020 08:22 PSA: 2.89   Review of Systems  Constitutional: Positive for malaise/fatigue.  HENT: Negative for hearing loss.   Eyes: Negative for blurred vision.  Respiratory: Positive for shortness of breath.   Cardiovascular: Negative for chest pain.  Musculoskeletal: Negative for falls and joint pain.  Skin: Negative for rash.  Neurological: Positive for weakness.  Psychiatric/Behavioral: Negative for depression.       +grief    Past Medical History:  Diagnosis Date  . Bradycardia   . Chicken pox   . COVID-19    01/07/20 [facility] s/p MAB infusion 01/13/20 [facility] ED  . GERD (gastroesophageal reflux disease)   . Hiatal hernia    small noted 2013   . Hyperlipidemia   . Kidney stone   . Premature atrial contractions   . Premature ventricular contraction   . Vitamin D deficiency   . Wrist fracture    right    Past Surgical History:  Procedure Laterality Date  . LASIK     2004   . TONSILLECTOMY AND ADENOIDECTOMY  1978   Family History  Problem Relation Age of Onset  . Cancer Mother        kidney cancer   . Kidney disease Mother   . Diabetes Mother   . Hearing loss Mother   . Heart disease Mother   . Cancer Father        prostate, colon   . Kidney disease Father   . Diabetes Father   . Hearing loss Father   . Colon cancer Father 36  . Colon  polyps Father   . Stroke Maternal Grandmother   . Cancer Maternal Grandfather        bladder cancer, +smoker, +prostate   . Alcohol abuse Maternal Grandfather   . Colon cancer Maternal Grandfather   . Stomach cancer Other   . Rectal cancer Other   . Testicular cancer Cousin        M cousin  . Esophageal cancer Neg Hx    Social History   Socioeconomic History  . Marital status: Married    Spouse name: Not on file  . Number of children: Not on file  . Years of education: Not on file  . Highest education level: Not on file  Occupational History  . Not on file  Tobacco Use  . Smoking status: Former Research scientist (life sciences)  . Smokeless tobacco: Former Systems developer    Types: Chew  Substance and Sexual Activity  . Alcohol use: Not Currently    Comment: rare  . Drug use: No  . Sexual activity: Yes  Other Topics Concern  . 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  Feels safe in relationship, wears seat belt, owns guns in home    Social Determinants of Health   Financial Resource Strain: Not on file  Food Insecurity: Not on file  Transportation Needs: Not on file  Physical Activity: Not on file  Stress: Not on file  Social Connections: Not on file  Intimate Partner Violence: Not on file   Current Meds  Medication Sig  . fluticasone (FLONASE) 50 MCG/ACT nasal spray Place 2 sprays into both nostrils daily.   Current Facility-Administered Medications for the 02/12/20 encounter (Office Visit) with McLean-Scocuzza, Nino Glow, MD  Medication  . 0.9 %  sodium chloride infusion   No Known Allergies Recent Results (from the past 2160 hour(s))  Basic metabolic panel     Status: Abnormal   Collection Time: 01/13/20  8:22 AM  Result Value Ref Range   Sodium 136 135 - 145 mmol/L   Potassium 3.4 (L) 3.5 - 5.1 mmol/L   Chloride 99 98 - 111 mmol/L   CO2 25 22 - 32  mmol/L   Glucose, Bld 111 (H) 70 - 99 mg/dL    Comment: Glucose reference range applies only to samples taken after fasting for at least 8 hours.   BUN 11 6 - 20 mg/dL   Creatinine, Ser 0.82 0.61 - 1.24 mg/dL   Calcium 8.2 (L) 8.9 - 10.3 mg/dL   GFR, Estimated >60 >60 mL/min    Comment: (NOTE) Calculated using the CKD-EPI Creatinine Equation (2021)    Anion gap 12 5 - 15    Comment: Performed at [facility], [address] [place], [place] [address]  CBC     Status: Abnormal   Collection Time: 01/13/20  8:22 AM  Result Value Ref Range   WBC 5.5 4.0 - 10.5 K/uL   RBC 5.51 4.22 - 5.81 MIL/uL   Hemoglobin 16.0 13.0 - 17.0 g/dL   HCT 47.3 39.0 - 52.0 %   MCV 85.8 80.0 - 100.0 fL   MCH 29.0 26.0 - 34.0 pg   MCHC 33.8 30.0 - 36.0 g/dL   RDW 13.2 11.5 - 15.5 %   Platelets 130 (L) 150 - 400 K/uL   nRBC 0.0 0.0 - 0.2 %    Comment: Performed at [facility], [address], [place], [place] [address]  Troponin I (High Sensitivity)     Status: None   Collection Time: 01/13/20  8:22 AM  Result Value Ref Range   Troponin I (High Sensitivity) 8 <18 ng/L    Comment: (NOTE) Elevated high sensitivity troponin I (hsTnI) values and significant  changes across serial measurements may suggest ACS but many other  chronic and acute conditions are known to elevate hsTnI results.  Refer to the "Links" section for chest pain algorithms and additional  guidance. Performed at [facility], [address] [place], [place] [address]   Troponin I (High Sensitivity)     Status: None   Collection Time: 01/13/20 11:36 AM  Result Value Ref Range   Troponin I (High Sensitivity) 7 <18 ng/L    Comment: (NOTE) Elevated high sensitivity troponin I (hsTnI) values and significant  changes across serial measurements may suggest ACS but many other  chronic and acute conditions are known to elevate hsTnI results.  Refer to the "Links" section for chest pain algorithms and  additional  guidance. Performed at [facility], [address], [place],  [address]   Fibrin derivatives D-Dimer     Status: Abnormal  Collection Time: 01/13/20  3:00 PM  Result Value Ref Range   Fibrin derivatives D-dimer ([facility]) 1,938.33 (H) 0.00 - 499.00 ng/mL (FEU)    Comment: (NOTE) <> Exclusion of Venous Thromboembolism (VTE) - OUTPATIENT ONLY   (Emergency Department or [place])    0-499 ng/ml (FEU): With a low to intermediate pretest probability                      for VTE this test result excludes the diagnosis                      of VTE.   >499 ng/ml (FEU) : VTE not excluded; additional work up for VTE is                      required.  <> Testing on Inpatients and Evaluation of Disseminated Intravascular   Coagulation (DIC) Reference Range:   0-499 ng/ml (FEU) Performed at [facility], [address] [place], [place] [address]   Procalcitonin - Baseline     Status: None   Collection Time: 01/13/20  4:38 PM  Result Value Ref Range   Procalcitonin <0.10 ng/mL    Comment:        Interpretation: PCT (Procalcitonin) <= 0.5 ng/mL: Systemic infection (sepsis) is not likely. Local bacterial infection is possible. (NOTE)       Sepsis PCT Algorithm           Lower Respiratory Tract                                      Infection PCT Algorithm    ----------------------------     ----------------------------         PCT < 0.25 ng/mL                PCT < 0.10 ng/mL          Strongly encourage             Strongly discourage   discontinuation of antibiotics    initiation of antibiotics    ----------------------------     -----------------------------       PCT 0.25 - 0.50 ng/mL            PCT 0.10 - 0.25 ng/mL               OR       >80% decrease in PCT            Discourage initiation of                                            antibiotics      Encourage discontinuation           of antibiotics    ----------------------------      -----------------------------         PCT >= 0.50 ng/mL              PCT 0.26 - 0.50 ng/mL               AND        <80% decrease in PCT             Encourage initiation of  antibiotics       Encourage continuation           of antibiotics    ----------------------------     -----------------------------        PCT >= 0.50 ng/mL                  PCT > 0.50 ng/mL               AND         increase in PCT                  Strongly encourage                                      initiation of antibiotics    Strongly encourage escalation           of antibiotics                                     -----------------------------                                           PCT <= 0.25 ng/mL                                                 OR                                        > 80% decrease in PCT                                      Discontinue / Do not initiate                                             antibiotics  Performed at [facility], [address] [place], [place] [address]   Hepatitis C antibody     Status: None   Collection Time: 02/07/20  8:22 AM  Result Value Ref Range   Hepatitis C Ab NON-REACTIVE NON-REACTI   SIGNAL TO CUT-OFF 0.01 <1.00    Comment: . HCV antibody was non-reactive. There is no laboratory  evidence of HCV infection. . In most cases, no further action is required. However, if recent HCV exposure is suspected, a test for HCV RNA (test code [phone]) is suggested. . For additional information please refer to http://education.quest[facility].com/faq/FAQ22v1 (This link is being provided for informational/ educational purposes only.) .   PSA     Status: None   Collection Time: 02/07/20  8:22 AM  Result Value Ref Range   PSA 2.89 0.10 - 4.00 ng/mL    Comment: Test performed using Access Hybritech PSA Assay, a parmagnetic partical, chemiluminecent immunoassay.  Urinalysis, Routine w reflex  microscopic     Status:  None   Collection Time: 02/07/20  8:22 AM  Result Value Ref Range   Color, Urine YELLOW YELLOW   APPearance CLEAR CLEAR   Specific Gravity, Urine 1.015 1.001 - 1.03   pH 6.5 5.0 - 8.0   Glucose, UA NEGATIVE NEGATIVE   Bilirubin Urine NEGATIVE NEGATIVE   Ketones, ur NEGATIVE NEGATIVE   Hgb urine dipstick NEGATIVE NEGATIVE   Protein, ur NEGATIVE NEGATIVE   Nitrite NEGATIVE NEGATIVE   Leukocytes,Ua NEGATIVE NEGATIVE  TSH     Status: None   Collection Time: 02/07/20  8:22 AM  Result Value Ref Range   TSH 1.48 0.35 - 4.50 uIU/mL  CBC with Differential/Platelet     Status: Abnormal   Collection Time: 02/07/20  8:22 AM  Result Value Ref Range   WBC 6.2 4.0 - 10.5 K/uL   RBC 5.13 4.22 - 5.81 Mil/uL   Hemoglobin 14.9 13.0 - 17.0 g/dL   HCT 44.9 39.0 - 52.0 %   MCV 87.7 78.0 - 100.0 fl   MCHC 33.2 30.0 - 36.0 g/dL   RDW 15.5 11.5 - 15.5 %   Platelets 172.0 150.0 - 400.0 K/uL   Neutrophils Relative % 54.4 43.0 - 77.0 %   Lymphocytes Relative 27.3 12.0 - 46.0 %   Monocytes Relative 13.6 (H) 3.0 - 12.0 %   Eosinophils Relative 4.1 0.0 - 5.0 %   Basophils Relative 0.6 0.0 - 3.0 %   Neutro Abs 3.4 1.4 - 7.7 K/uL   Lymphs Abs 1.7 0.7 - 4.0 K/uL   Monocytes Absolute 0.8 0.1 - 1.0 K/uL   Eosinophils Absolute 0.3 0.0 - 0.7 K/uL   Basophils Absolute 0.0 0.0 - 0.1 K/uL  Lipid panel     Status: Abnormal   Collection Time: 02/07/20  8:22 AM  Result Value Ref Range   Cholesterol 223 (H) 0 - 200 mg/dL    Comment: ATP III Classification       Desirable:  < 200 mg/dL               Borderline High:  200 - 239 mg/dL          High:  > = 240 mg/dL   Triglycerides 152.0 (H) 0.0 - 149.0 mg/dL    Comment: Normal:  <150 mg/dLBorderline High:  150 - 199 mg/dL   HDL 40.30 >39.00 mg/dL   VLDL 30.4 0.0 - 40.0 mg/dL   LDL Cholesterol 152 (H) 0 - 99 mg/dL   Total CHOL/HDL Ratio 6     Comment:                Men          Women1/2 Average Risk     3.4          3.3Average Risk           5.0          4.42X Average Risk          9.6          7.13X Average Risk          15.0          11.0                       NonHDL 182.56     Comment: NOTE:  Non-HDL goal should be 30 mg/dL higher than patient's LDL goal (i.e. LDL goal of < 70 mg/dL, would have non-HDL goal of < 100 mg/dL)  Comprehensive metabolic panel     Status: None   Collection Time: 02/07/20  8:22 AM  Result Value Ref Range   Sodium 141 135 - 145 mEq/L   Potassium 4.4 3.5 - 5.1 mEq/L   Chloride 105 96 - 112 mEq/L   CO2 27 19 - 32 mEq/L   Glucose, Bld 86 70 - 99 mg/dL   BUN 8 6 - 23 mg/dL   Creatinine, Ser 0.83 0.40 - 1.50 mg/dL   Total Bilirubin 0.7 0.2 - 1.2 mg/dL   Alkaline Phosphatase 64 39 - 117 U/L   AST 22 0 - 37 U/L   ALT 35 0 - 53 U/L   Total Protein 6.5 6.0 - 8.3 g/dL   Albumin 4.2 3.5 - 5.2 g/dL   GFR 102.69 >60.00 mL/min    Comment: Calculated using the CKD-EPI Creatinine Equation (2021)   Calcium 9.2 8.4 - 10.5 mg/dL   Objective  Body mass index is 39.3 kg/m. Wt Readings from Last 3 Encounters:  02/12/20 289 lb 12.8 oz (131.5 kg)  01/22/20 271 lb (122.9 kg)  01/13/20 273 lb (123.8 kg)   Temp Readings from Last 3 Encounters:  02/12/20 98.5 F (36.9 C)  01/13/20 (!) 100.5 F (38.1 C) (Oral)  11/13/19 98.4 F (36.9 C) (Oral)   BP Readings from Last 3 Encounters:  02/12/20 122/82  01/14/20 113/74  12/12/19 104/82   Pulse Readings from Last 3 Encounters:  02/12/20 (!) 56  01/14/20 76  12/12/19 63    Physical Exam Vitals and nursing note reviewed.  Constitutional:      Appearance: Normal appearance. He is well-developed and well-groomed. He is obese.  HENT:     Head: Normocephalic and atraumatic.  Eyes:     Conjunctiva/sclera: Conjunctivae normal.     Pupils: Pupils are equal, round, and reactive to light.  Cardiovascular:     Rate and Rhythm: Regular rhythm. Bradycardia present.     Heart sounds: Normal heart sounds.  Pulmonary:     Effort: Pulmonary effort is normal.      Breath sounds: Normal breath sounds.  Skin:    General: Skin is warm and dry.  Neurological:     General: No focal deficit present.     Mental Status: He is alert. Mental status is at baseline.     Gait: Gait normal.  Psychiatric:        Attention and Perception: Attention and perception normal.        Mood and Affect: Mood and affect normal.        Speech: Speech normal.        Behavior: Behavior normal. Behavior is cooperative.        Thought Content: Thought content normal.        Cognition and Memory: Cognition and memory normal.        Judgment: Judgment normal.     Assessment  Plan  Annual physical exam Declines flu shot, covid shot declines though consider in the future covid 19 +01/07/20 Hep B immune rec mmr vaccine in future Tdaphad 06/05/16  Had 2/3 hep B vaccinesimmuneas Of 06/21/2019 titer >1000 protected  FH prostate cancer in dad last PSA 0.89 05/2019-repeat PSAnormal   Ref. Range 05/29/2017 08:02 06/21/2019 08:26 02/07/2020 08:22  PSA Latest Ref Range: 0.10 - 4.00 ng/mL 0.89 0.81 2.89  02/12/20 [facility]   FH: prostate cancer - Plan: [facility] [place] Elevated PSA - Plan: Ambulatory referral to urology   Testosteronenormal  11/02/17  Colonoscopy 10/25/19 polyps sessile hyperplastic f/u 5 years   Former smoker quit 15-20 years ago smoker x 10 years 1/2 ppd max 1 ppd no FH lung cancer Former [place] as of 06/25/19 -Congratulated on quitting both rec healthy diet and exercise  11/14/19 testicle US  FINDINGS: Right testicle  Measurements: 5.1 x 2.7 x 3.8 cm. No mass or microlithiasis visualized.  Left testicle  Measurements: 4.6 x 2.9 x 3.3 cm. No mass or microlithiasis visualized.  Right epididymis:  Scattered small epididymal cysts are noted.  Left epididymis:  Normal in size and appearance.  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  Varicocele:  None visualized.  Pulsed Doppler  interrogation of both testes demonstrates normal low resistance arterial and venous waveforms bilaterally.  IMPRESSION: Bilateral varicoceles.  Small right epididymal cyst.  Normal-appearing testicles. Electronically Signed   By: Inez Catalina M.D.   On: 11/14/2019 16:52   Pneumonia due to COVID-19 virus still with dry cough but improved covid +01/07/20 [facility] and s/p MAB infusion 01/13/20 - Plan: DG Chest 2 View, albuterol (VENTOLIN HFA) 108 (90 Base) MCG/ACT inhaler CXR  Consider pulmonary leb in future if sob with exertion continues  Exercise for endurance  Hyperlipidemia, unspecified hyperlipidemia type Given info    Provider: Dr. Olivia Mackie McLean-Scocuzza-Internal Medicine

## 2020-02-12 NOTE — Patient Instructions (Addendum)
[facility]  [address] [place]  [phone]    Albuterol inhaler  mucinex dm green label High Cholesterol  High cholesterol is a condition in which the blood has high levels of a white, waxy substance similar to fat (cholesterol). The liver makes all the cholesterol that the body needs. The human body needs small amounts of cholesterol to help build cells. A person gets extra or excess cholesterol from the food that he or she eats. The blood carries cholesterol from the liver to the rest of the body. If you have high cholesterol, deposits (plaques) may build up on the walls of your arteries. Arteries are the blood vessels that carry blood away from your heart. These plaques make the arteries narrow and stiff. Cholesterol plaques increase your risk for heart attack and stroke. Work with your health care provider to keep your cholesterol levels in a healthy range. What increases the risk? The following factors may make you more likely to develop this condition:  Eating foods that are high in animal fat (saturated fat) or cholesterol.  Being overweight.  Not getting enough exercise.  A family history of high cholesterol (familial hypercholesterolemia).  Use of tobacco products.  Having diabetes. What are the signs or symptoms? There are no symptoms of this condition. How is this diagnosed? This condition may be diagnosed based on the results of a blood test.  If you are older than 50 years of age, your health care provider may check your cholesterol levels every 4-6 years.  You may be checked more often if you have high cholesterol or other risk factors for heart disease. The blood test for cholesterol measures:  "Bad" cholesterol, or LDL cholesterol. This is the main type of cholesterol that causes heart disease. The desired level is less than 100 mg/dL.  "Good" cholesterol, or HDL cholesterol. HDL helps protect against heart disease by cleaning the arteries and  carrying the LDL to the liver for processing. The desired level for HDL is 60 mg/dL or higher.  Triglycerides. These are fats that your body can store or burn for energy. The desired level is less than 150 mg/dL.  Total cholesterol. This measures the total amount of cholesterol in your blood and includes LDL, HDL, and triglycerides. The desired level is less than 200 mg/dL. How is this treated? This condition may be treated with:  Diet changes. You may be asked to eat foods that have more fiber and less saturated fats or added sugar.  Lifestyle changes. These may include regular exercise, maintaining a healthy weight, and quitting use of tobacco products.  Medicines. These are given when diet and lifestyle changes have not worked. You may be prescribed a statin medicine to help lower your cholesterol levels. Follow these instructions at home: Eating and drinking  Eat a healthy, balanced diet. This diet includes: ? Daily servings of a variety of fresh, frozen, or canned fruits and vegetables. ? Daily servings of whole grain foods that are rich in fiber. ? Foods that are low in saturated fats and trans fats. These include poultry and fish without skin, lean cuts of meat, and low-fat dairy products. ? A variety of fish, especially oily fish that contain omega-3 fatty acids. Aim to eat fish at least 2 times a week.  Avoid foods and drinks that have added sugar.  Use healthy cooking methods, such as roasting, grilling, broiling, baking, poaching, steaming, and stir-frying. Do not fry your food except for stir-frying.   Lifestyle  Get regular exercise. Aim to exercise for a total of 150 minutes a week. Increase your activity level by doing activities such as gardening, walking, and taking the stairs.  Do not use any products that contain nicotine or tobacco, such as cigarettes, e-cigarettes, and chewing tobacco. If you need help quitting, ask your health care provider.   General  instructions  Take over-the-counter and prescription medicines only as told by your health care provider.  Keep all follow-up visits as told by your health care provider. This is important. Where to find more information  American Heart Association: www.heart.org  National Heart, Lung, and Blood Institute: https://wilson-eaton.com/ Contact a health care provider if:  You have trouble achieving or maintaining a healthy diet or weight.  You are starting an exercise program.  You are unable to stop smoking. Get help right away if:  You have chest pain.  You have trouble breathing.  You have any symptoms of a stroke. "BE FAST" is an easy way to remember the main warning signs of a stroke: ? B - Balance. Signs are dizziness, sudden trouble walking, or loss of balance. ? E - Eyes. Signs are trouble seeing or a sudden change in vision. ? F - Face. Signs are sudden weakness or numbness of the face, or the face or eyelid drooping on one side. ? A - Arms. Signs are weakness or numbness in an arm. This happens suddenly and usually on one side of the body. ? S - Speech. Signs are sudden trouble speaking, slurred speech, or trouble understanding what people say. ? T - Time. Time to call emergency services. Write down what time symptoms started.  You have other signs of a stroke, such as: ? A sudden, severe headache with no known cause. ? Nausea or vomiting. ? Seizure. These symptoms may represent a serious problem that is an emergency. Do not wait to see if the symptoms will go away. Get medical help right away. Call your local emergency services (911 in the [place] Do not drive yourself to the hospital. Summary  Cholesterol plaques increase your risk for heart attack and stroke. Work with your health care provider to keep your cholesterol levels in a healthy range.  Eat a healthy, balanced diet, get regular exercise, and maintain a healthy weight.  Do not use any products that contain nicotine  or tobacco, such as cigarettes, e-cigarettes, and chewing tobacco.  Get help right away if you have any symptoms of a stroke. This information is not intended to replace advice given to you by your health care provider. Make sure you discuss any questions you have with your health care provider. Document Revised: 12/10/2018 Document Reviewed: 12/10/2018 Elsevier Patient Education  2021 [place].  Cholesterol Content in Foods Cholesterol is a waxy, fat-like substance that helps to carry fat in the blood. The body needs cholesterol in small amounts, but too much cholesterol can cause damage to the arteries and heart. Most people should eat less than 200 milligrams (mg) of cholesterol a day. Foods with cholesterol Cholesterol is found in animal-based foods, such as meat, seafood, and dairy. Generally, low-fat dairy and lean meats have less cholesterol than full-fat dairy and fatty meats. The milligrams of cholesterol per serving (mg per serving) of common cholesterol-containing foods are listed below. Meat and other proteins  Egg - one large whole egg has 186 mg.  Veal shank - 4 oz has 141 mg.  Lean ground [place] (93% lean) - 4 oz has 118 mg.  Fat-trimmed lamb loin - 4 oz has 106 mg.  Lean ground beef (90% lean) - 4 oz has 100 mg.  Lobster - 3.5 oz has 90 mg.  Pork loin chops - 4 oz has 86 mg.  Canned salmon - 3.5 oz has 83 mg.  Fat-trimmed beef top loin - 4 oz has 78 mg.  Frankfurter - 1 frank (3.5 oz) has 77 mg.  Crab - 3.5 oz has 71 mg.  Roasted chicken without skin, white meat - 4 oz has 66 mg.  Light bologna - 2 oz has 45 mg.  Deli-cut [place] - 2 oz has 31 mg.  Canned tuna - 3.5 oz has 31 mg.  Bacon - 1 oz has 29 mg.  Oysters and mussels (raw) - 3.5 oz has 25 mg.  Mackerel - 1 oz has 22 mg.  Trout - 1 oz has 20 mg.  Pork sausage - 1 link (1 oz) has 17 mg.  Salmon - 1 oz has 16 mg.  Tilapia - 1 oz has 14 mg. Dairy  Soft-serve ice cream -  cup (4 oz)  has 103 mg.  Whole-milk yogurt - 1 cup (8 oz) has 29 mg.  Cheddar cheese - 1 oz has 28 mg.  American cheese - 1 oz has 28 mg.  Whole milk - 1 cup (8 oz) has 23 mg.  2% milk - 1 cup (8 oz) has 18 mg.  Cream cheese - 1 tablespoon (Tbsp) has 15 mg.  Cottage cheese -  cup (4 oz) has 14 mg.  Low-fat (1%) milk - 1 cup (8 oz) has 10 mg.  Sour cream - 1 Tbsp has 8.5 mg.  Low-fat yogurt - 1 cup (8 oz) has 8 mg.  Nonfat Greek yogurt - 1 cup (8 oz) has 7 mg.  Half-and-half cream - 1 Tbsp has 5 mg. Fats and oils  Cod liver oil - 1 tablespoon (Tbsp) has 82 mg.  Butter - 1 Tbsp has 15 mg.  Lard - 1 Tbsp has 14 mg.  Bacon grease - 1 Tbsp has 14 mg.  Mayonnaise - 1 Tbsp has 5-10 mg.  Margarine - 1 Tbsp has 3-10 mg. Exact amounts of cholesterol in these foods may vary depending on specific ingredients and brands.   Foods without cholesterol Most plant-based foods do not have cholesterol unless you combine them with a food that has cholesterol. Foods without cholesterol include:  Grains and cereals.  Vegetables.  Fruits.  Vegetable oils, such as olive, canola, and sunflower oil.  Legumes, such as peas, beans, and lentils.  Nuts and seeds.  Egg whites.   Summary  The body needs cholesterol in small amounts, but too much cholesterol can cause damage to the arteries and heart.  Most people should eat less than 200 milligrams (mg) of cholesterol a day. This information is not intended to replace advice given to you by your health care provider. Make sure you discuss any questions you have with your health care provider. Document Revised: 06/03/2019 Document Reviewed: 06/03/2019 Elsevier Patient Education  2021 [employer].   Ref. Range 05/29/2017 08:02 11/02/2017 08:17 06/21/2019 08:26 02/07/2020 08:22  Cholesterol Latest Ref Range: 0 - 200 mg/dL 192 198 174 223 (H)  HDL Cholesterol Latest Ref Range: >39.00 mg/dL 33.60 (L) 31 (L) 32.50 (L) 40.30  LDL (calc) Latest Ref  Range: 0 - 99 mg/dL 135 (H)  122 (H) 152 (H)  LDL Cholesterol (Calc) Latest Units: mg/dL (calc)  133 (H)    NonHDL  Unknown 158.24  141.90 182.56  Non-HDL Cholesterol (Calc) Latest Ref Range: <130 mg/dL (calc)  167 (H)    Triglycerides Latest Ref Range: 0.0 - 149.0 mg/dL 118.0 207 (H) 102.0 152.0 (H)  VLDL Latest Ref Range: 0.0 - 40.0 mg/dL 23.6  20.4 30.4

## 2020-02-14 ENCOUNTER — Encounter: Payer: Self-pay | Admitting: Internal Medicine

## 2020-02-14 DIAGNOSIS — M47814 Spondylosis without myelopathy or radiculopathy, thoracic region: Secondary | ICD-10-CM | POA: Insufficient documentation

## 2020-02-14 NOTE — Addendum Note (Signed)
Addended by: Orland Mustard on: 02/14/2020 07:39 AM   Modules accepted: Orders

## 2020-02-16 NOTE — Progress Notes (Signed)
Roger [name] Phone: [phone] Subjective:   Roger [name], am serving as a scribe for Dr. Hulan Saas. This visit occurred during the SARS-CoV-2 public health emergency.  Safety protocols were in place, including screening questions prior to the visit, additional usage of staff PPE, and extensive cleaning of exam room while observing appropriate contact time as indicated for disinfecting solutions.   I'm seeing this patient by the request  of:  McLean-Scocuzza, Nino Glow, MD  CC: back pain follow up   [id number]  Roger [name] is a 50 y.o. male coming in with complaint of back and neck pain. OMT 12/12/2019. Patient states he did have Covid.  Because of that he had not been as active.  Was out of work for 3 weeks.  Has noticed more tightness of the neck as well as the low back.  Patient denies any radiation to any of the extremities.  Has been able to do his daily activities fine maybe gets a little winded from time to time.  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   As well as notes that were available from care everywhere and other healthcare systems.  Past medical history, social, surgical and family history all reviewed in electronic medical record.  [name] pertanent information unless stated regarding to the chief complaint.   Past Medical History:  Diagnosis Date  . Bradycardia   . Chicken pox   . COVID-19    01/07/20 [facility] s/p [facility]AB infusion 01/13/20 [facility] ED  . GERD (gastroesophageal reflux disease)   . Hiatal hernia    small noted 2013   . Hyperlipidemia   . Kidney stone   . Premature atrial contractions   . Premature  ventricular contraction   . Vitamin D deficiency   . Wrist fracture    right     [name] Known Allergies   Review of Systems:  [name] headache, visual changes, nausea, vomiting, diarrhea, constipation, dizziness, abdominal pain, skin rash, fevers, chills, night sweats, weight loss, swollen lymph nodes, body aches, joint swelling, chest pain, shortness of breath, mood changes. POSITIVE muscle aches  Objective  Blood pressure 110/78, pulse 79, height 6' (1.829 m), weight 290 lb (131.5 kg), SpO2 97 %.   General: [name] apparent distress alert and oriented x3 mood and affect normal, dressed appropriately.  HEENT: Pupils equal, extraocular movements intact  Respiratory: Patient's speak in full sentences and does not appear short of breath  Cardiovascular: [name] lower extremity edema, non tender, [name] erythema  Neuro: Cranial nerves II through XII are intact, neurovascularly intact in all extremities with 2+ DTRs and 2+ pulses.  Gait normal with good balance and coordination.  MSK:  Non tender with full range of motion and good stability and symmetric strength and tone of shoulders, elbows, wrist, hip, knee and ankles bilaterally.  Back -low back exam does have some mild loss of lordosis.  Tightness noted more in the thoracolumbar juncture right greater than left.  Negative straight leg test.  Does have tightness with FABER test bilaterally right greater than left.  5 out of 5 strength of all the extremities noted with deep tendon reflexes intact.  Osteopathic findings  C2 flexed rotated and side bent right T11 extended rotated and side bent left L1 flexed rotated and side bent right Sacrum right on right       Assessment and Plan:  Low back pain Patient does have very mild degenerative disc disease at L1-L3 but otherwise has done very well.  Patient has had some more aching recently but did have Covid.  Discussed with patient at this time.  Patient is not taking any medications.  Trying to stay  active.  Increase activity as tolerated.  Follow-up again in 3 to 4 months.  Worsening pain can always consider advanced imaging but is doing well at this time.    Nonallopathic problems  Decision today to treat with OMT was based on Physical Exam  After verbal consent patient was treated with HVLA, ME, FPR techniques in cervical, thoracic, lumbar, and sacral  areas  Patient tolerated the procedure well with improvement in symptoms  Patient given exercises, stretches and lifestyle modifications  See medications in patient instructions if given  Patient will follow up in 4-8 weeks      The above documentation has been reviewed and is accurate and complete Lyndal Pulley, DO       Note: This dictation was prepared with Dragon dictation along with smaller phrase technology. Any transcriptional errors that result from this process are unintentional.

## 2020-02-17 ENCOUNTER — Ambulatory Visit (INDEPENDENT_AMBULATORY_CARE_PROVIDER_SITE_OTHER): Payer: BC Managed Care – PPO

## 2020-02-17 ENCOUNTER — Encounter: Payer: Self-pay | Admitting: Family Medicine

## 2020-02-17 ENCOUNTER — Other Ambulatory Visit: Payer: Self-pay

## 2020-02-17 ENCOUNTER — Ambulatory Visit (INDEPENDENT_AMBULATORY_CARE_PROVIDER_SITE_OTHER): Payer: BC Managed Care – PPO | Admitting: Family Medicine

## 2020-02-17 VITALS — BP 110/78 | HR 79 | Ht 72.0 in | Wt 290.0 lb

## 2020-02-17 DIAGNOSIS — M47812 Spondylosis without myelopathy or radiculopathy, cervical region: Secondary | ICD-10-CM | POA: Diagnosis not present

## 2020-02-17 DIAGNOSIS — M542 Cervicalgia: Secondary | ICD-10-CM | POA: Diagnosis not present

## 2020-02-17 DIAGNOSIS — G8929 Other chronic pain: Secondary | ICD-10-CM | POA: Diagnosis not present

## 2020-02-17 DIAGNOSIS — M545 Low back pain, unspecified: Secondary | ICD-10-CM | POA: Diagnosis not present

## 2020-02-17 DIAGNOSIS — M999 Biomechanical lesion, unspecified: Secondary | ICD-10-CM

## 2020-02-17 NOTE — Assessment & Plan Note (Signed)
Patient does have very mild degenerative disc disease at L1-L3 but otherwise has done very well.  Patient has had some more aching recently but did have Covid.  Discussed with patient at this time.  Patient is not taking any medications.  Trying to stay active.  Increase activity as tolerated.  Follow-up again in 3 to 4 months.  Worsening pain can always consider advanced imaging but is doing well at this time.

## 2020-02-17 NOTE — Patient Instructions (Signed)
Xray today Keep doing everything else See me in 3-4 months

## 2020-02-19 ENCOUNTER — Other Ambulatory Visit: Payer: Self-pay

## 2020-02-19 ENCOUNTER — Ambulatory Visit (INDEPENDENT_AMBULATORY_CARE_PROVIDER_SITE_OTHER): Payer: BC Managed Care – PPO | Admitting: Pulmonary Disease

## 2020-02-19 ENCOUNTER — Encounter: Payer: Self-pay | Admitting: Pulmonary Disease

## 2020-02-19 VITALS — BP 122/74 | HR 72 | Temp 97.3°F | Ht 72.0 in | Wt 295.4 lb

## 2020-02-19 DIAGNOSIS — Z8616 Personal history of COVID-19: Secondary | ICD-10-CM | POA: Diagnosis not present

## 2020-02-19 DIAGNOSIS — R918 Other nonspecific abnormal finding of lung field: Secondary | ICD-10-CM

## 2020-02-19 DIAGNOSIS — R053 Chronic cough: Secondary | ICD-10-CM | POA: Diagnosis not present

## 2020-02-19 DIAGNOSIS — K219 Gastro-esophageal reflux disease without esophagitis: Secondary | ICD-10-CM | POA: Diagnosis not present

## 2020-02-19 MED ORDER — BREO ELLIPTA 100-25 MCG/INH IN AEPB: 1.0000 | INHALATION_SPRAY | Freq: Every day | RESPIRATORY_TRACT | 0 refills | Status: AC

## 2020-02-19 NOTE — Progress Notes (Signed)
Subjective:    Patient ID: Roger [name], male    DOB: [birth year], 50 y.o.   MRN: [id number]  HPI The patient is a 50 year old former smoker (quit 2003, 12-pack-year history) who presents for evaluation of persistent right lung opacity after COVID-19.  He is kindly referred by Dr. Olivia Mackie Mclean-Scocuzza was diagnosed with COVID on 07 January 2020, various members of his family had [place] including his father who succumbed to the disease.  The patient presented to the emergency room on 20 December due to increasing shortness of breath and pleuritic chest pain associated with his COVID-19 illness.  He had received monoclonal antibody infusion however he was at day 7 of his illness when this was done.  He states he never got much improvement from that.  Possibility could be that this was all Omicron variant for which monoclonal antibodies are not effective.  He underwent CT scan of the chest on 20 December that showed no PE but bilateral groundglass airspace opacities more predominant on the lower lobes.  Findings were classic for COVID-19 pneumonia.  The patient has had a persistent dry cough since his illness however he does admit that this is getting better.  Albuterol has not helped this cough.  On 20 January his chest x-ray showed persistent hazy bilateral peripheral predominant opacities more conspicuous on the right lung base consistent with the sequela of COVID-19 infection.  Patient feels markedly better since his initial illness and has been regaining strength.  Dyspnea is markedly improved.  He never had anosmia nor dysgeusia.  He has had no weight loss or anorexia.  No chest pain since his evaluation in the emergency room.  Has had gastroesophageal reflux that has been somewhat worse since his illness.  He voices no other complaint.   Review of Systems A 10 point review of systems was performed and it is as noted above otherwise negative.  Past Medical History:  Diagnosis Date  . Bradycardia    . Chicken pox   . COVID-19    01/07/20 [facility] s/p MAB infusion 01/13/20 [facility] ED  . GERD (gastroesophageal reflux disease)   . Hiatal hernia    small noted 2013   . Hyperlipidemia   . Kidney stone   . Premature atrial contractions   . Premature ventricular contraction   . Vitamin D deficiency   . Wrist fracture    right    Past Surgical History:  Procedure Laterality Date  . LASIK     2004   . TONSILLECTOMY AND ADENOIDECTOMY  1978   Family History  Problem Relation Age of Onset  . Cancer Mother        kidney cancer   . Kidney disease Mother   . Diabetes Mother   . Hearing loss Mother   . Heart disease Mother   . Cancer Father        prostate, colon   . Kidney disease Father   . Diabetes Father   . Hearing loss Father   . Colon cancer Father 17  . Colon polyps Father   . Stroke Maternal Grandmother   . Cancer Maternal Grandfather        bladder cancer, +smoker, +prostate   . Alcohol abuse Maternal Grandfather   . Colon cancer Maternal Grandfather   . Stomach cancer Other   . Rectal cancer Other   . Testicular cancer Cousin        M cousin  . Esophageal cancer Neg Hx    No  Known Allergies  Current Meds  Medication Sig  . albuterol (VENTOLIN HFA) 108 (90 Base) MCG/ACT inhaler Inhale 2 puffs into the lungs every 6 (six) hours as needed for wheezing or shortness of breath.  . fluticasone (FLONASE) 50 MCG/ACT nasal spray Place 2 sprays into both nostrils daily.  . fluticasone furoate-vilanterol (BREO ELLIPTA) 100-25 MCG/INH AEPB Inhale 1 puff into the lungs daily for 1 day.  . Multiple Vitamin (MULTIVITAMIN WITH MINERALS) TABS tablet Take 1 tablet by mouth daily.   Immunization History  Administered Date(s) Administered  . Hepatitis B, adult 06/09/2017, 07/11/2017  . Influenza-Unspecified 12/05/2017  . Tdap 06/05/2016      Objective:   Physical Exam BP 122/74 (BP Location: Left Arm, Cuff Size: Normal)   Pulse 72   Temp (!) 97.3 F (36.3 C)  (Temporal)   Ht 6' (1.829 m)   Wt 295 lb 6.4 oz (134 kg)   SpO2 97%   BMI 40.06 kg/m  GENERAL: HEAD: Normocephalic, atraumatic.  EYES: Pupils equal, round, reactive to light.  No scleral icterus.  MOUTH:  NECK: Supple. No thyromegaly. Trachea midline. No JVD.  No adenopathy. PULMONARY: Good air entry bilaterally.  No adventitious sounds. CARDIOVASCULAR: S1 and S2. Regular rate and rhythm.  ABDOMEN: MUSCULOSKELETAL: No joint deformity, no clubbing, no edema.  NEUROLOGIC:  SKIN: Intact,warm,dry. PSYCH:  January 13, 2020 chest x-ray, independently reviewed patchy basilar airspace disease:    CT scan of the chest performed January 13, 2020, independently reviewed: Confirms patchy airspace disease consistent with viral pneumonia:   12 February 2020 chest x-ray, mild persistent bilateral opacities, independently reviewed:   Recent Results (from the past 2160 hour(s))  Basic metabolic panel     Status: Abnormal   Collection Time: 01/13/20  8:22 AM  Result Value Ref Range   Sodium 136 135 - 145 mmol/L   Potassium 3.4 (L) 3.5 - 5.1 mmol/L   Chloride 99 98 - 111 mmol/L   CO2 25 22 - 32 mmol/L   Glucose, Bld 111 (H) 70 - 99 mg/dL    Comment: Glucose reference range applies only to samples taken after fasting for at least 8 hours.   BUN 11 6 - 20 mg/dL   Creatinine, Ser 0.82 0.61 - 1.24 mg/dL   Calcium 8.2 (L) 8.9 - 10.3 mg/dL   GFR, Estimated >60 >60 mL/min    Comment: (NOTE) Calculated using the CKD-EPI Creatinine Equation (2021)    Anion gap 12 5 - 15    Comment: Performed at [facility], [address] [place], [place] [address]  CBC     Status: Abnormal   Collection Time: 01/13/20  8:22 AM  Result Value Ref Range   WBC 5.5 4.0 - 10.5 K/uL   RBC 5.51 4.22 - 5.81 MIL/uL   Hemoglobin 16.0 13.0 - 17.0 g/dL   HCT 47.3 39.0 - 52.0 %   MCV 85.8 80.0 - 100.0 fL   MCH 29.0 26.0 - 34.0 pg   MCHC 33.8 30.0 - 36.0 g/dL   RDW 13.2 11.5 - 15.5 %   Platelets 130 (L)  150 - 400 K/uL   nRBC 0.0 0.0 - 0.2 %    Comment: Performed at [facility], [address] [place], [place] [address]  Troponin I (High Sensitivity)     Status: None   Collection Time: 01/13/20  8:22 AM  Result Value Ref Range   Troponin I (High Sensitivity) 8 <18 ng/L    Comment: (NOTE) Elevated high sensitivity  troponin I (hsTnI) values and significant  changes across serial measurements may suggest ACS but many other  chronic and acute conditions are known to elevate hsTnI results.  Refer to the "Links" section for chest pain algorithms and additional  guidance. Performed at [facility], [address] [place], [place] [address]   Troponin I (High Sensitivity)     Status: None   Collection Time: 01/13/20 11:36 AM  Result Value Ref Range   Troponin I (High Sensitivity) 7 <18 ng/L    Comment: (NOTE) Elevated high sensitivity troponin I (hsTnI) values and significant  changes across serial measurements may suggest ACS but many other  chronic and acute conditions are known to elevate hsTnI results.  Refer to the "Links" section for chest pain algorithms and additional  guidance. Performed at [facility], [address] [place], [place] [address]   Fibrin derivatives D-Dimer     Status: Abnormal   Collection Time: 01/13/20  3:00 PM  Result Value Ref Range   Fibrin derivatives D-dimer ([facility]) 1,938.33 (H) 0.00 - 499.00 ng/mL (FEU)    Comment: (NOTE) <> Exclusion of Venous Thromboembolism (VTE) - OUTPATIENT ONLY   (Emergency Department or [place])    0-499 ng/ml (FEU): With a low to intermediate pretest probability                      for VTE this test result excludes the diagnosis                      of VTE.   >499 ng/ml (FEU) : VTE not excluded; additional work up for VTE is                      required.  <> Testing on Inpatients and Evaluation of Disseminated Intravascular   Coagulation (DIC) Reference Range:   0-499 ng/ml  (FEU) Performed at [facility], [address] [place], [place] [address]   Procalcitonin - Baseline     Status: None   Collection Time: 01/13/20  4:38 PM  Result Value Ref Range   Procalcitonin <0.10 ng/mL    Comment:        Interpretation: PCT (Procalcitonin) <= 0.5 ng/mL: Systemic infection (sepsis) is not likely. Local bacterial infection is possible. (NOTE)       Sepsis PCT Algorithm           Lower Respiratory Tract                                      Infection PCT Algorithm    ----------------------------     ----------------------------         PCT < 0.25 ng/mL                PCT < 0.10 ng/mL          Strongly encourage             Strongly discourage   discontinuation of antibiotics    initiation of antibiotics    ----------------------------     -----------------------------       PCT 0.25 - 0.50 ng/mL            PCT 0.10 - 0.25 ng/mL               OR       >80% decrease in PCT  Discourage initiation of                                            antibiotics      Encourage discontinuation           of antibiotics    ----------------------------     -----------------------------         PCT >= 0.50 ng/mL              PCT 0.26 - 0.50 ng/mL               AND        <80% decrease in PCT             Encourage initiation of                                             antibiotics       Encourage continuation           of antibiotics    ----------------------------     -----------------------------        PCT >= 0.50 ng/mL                  PCT > 0.50 ng/mL               AND         increase in PCT                  Strongly encourage                                      initiation of antibiotics    Strongly encourage escalation           of antibiotics                                     -----------------------------                                           PCT <= 0.25 ng/mL                                                 OR                                         > 80% decrease in PCT                                      Discontinue / Do not initiate  antibiotics  Performed at [facility], [address] [place], [place] [address]   Hepatitis C antibody     Status: None   Collection Time: 02/07/20  8:22 AM  Result Value Ref Range   Hepatitis C Ab NON-REACTIVE NON-REACTI   SIGNAL TO CUT-OFF 0.01 <1.00    Comment: . HCV antibody was non-reactive. There is no laboratory  evidence of HCV infection. . In most cases, no further action is required. However, if recent HCV exposure is suspected, a test for HCV RNA (test code [phone]) is suggested. . For additional information please refer to http://education.quest[employer].com/faq/FAQ22v1 (This link is being provided for informational/ educational purposes only.) .   PSA     Status: None   Collection Time: 02/07/20  8:22 AM  Result Value Ref Range   PSA 2.89 0.10 - 4.00 ng/mL    Comment: Test performed using Access Hybritech PSA Assay, a parmagnetic partical, chemiluminecent immunoassay.  Urinalysis, Routine w reflex microscopic     Status: None   Collection Time: 02/07/20  8:22 AM  Result Value Ref Range   Color, Urine YELLOW YELLOW   APPearance CLEAR CLEAR   Specific Gravity, Urine 1.015 1.001 - 1.03   pH 6.5 5.0 - 8.0   Glucose, UA NEGATIVE NEGATIVE   Bilirubin Urine NEGATIVE NEGATIVE   Ketones, ur NEGATIVE NEGATIVE   Hgb urine dipstick NEGATIVE NEGATIVE   Protein, ur NEGATIVE NEGATIVE   Nitrite NEGATIVE NEGATIVE   Leukocytes,Ua NEGATIVE NEGATIVE  TSH     Status: None   Collection Time: 02/07/20  8:22 AM  Result Value Ref Range   TSH 1.48 0.35 - 4.50 uIU/mL  CBC with Differential/Platelet     Status: Abnormal   Collection Time: 02/07/20  8:22 AM  Result Value Ref Range   WBC 6.2 4.0 - 10.5 K/uL   RBC 5.13 4.22 - 5.81 Mil/uL   Hemoglobin 14.9 13.0 - 17.0 g/dL   HCT 44.9 39.0 - 52.0 %   MCV 87.7 78.0 - 100.0  fl   MCHC 33.2 30.0 - 36.0 g/dL   RDW 15.5 11.5 - 15.5 %   Platelets 172.0 150.0 - 400.0 K/uL   Neutrophils Relative % 54.4 43.0 - 77.0 %   Lymphocytes Relative 27.3 12.0 - 46.0 %   Monocytes Relative 13.6 (H) 3.0 - 12.0 %   Eosinophils Relative 4.1 0.0 - 5.0 %   Basophils Relative 0.6 0.0 - 3.0 %   Neutro Abs 3.4 1.4 - 7.7 K/uL   Lymphs Abs 1.7 0.7 - 4.0 K/uL   Monocytes Absolute 0.8 0.1 - 1.0 K/uL   Eosinophils Absolute 0.3 0.0 - 0.7 K/uL   Basophils Absolute 0.0 0.0 - 0.1 K/uL  Lipid panel     Status: Abnormal   Collection Time: 02/07/20  8:22 AM  Result Value Ref Range   Cholesterol 223 (H) 0 - 200 mg/dL    Comment: ATP III Classification       Desirable:  < 200 mg/dL               Borderline High:  200 - 239 mg/dL          High:  > = 240 mg/dL   Triglycerides 152.0 (H) 0.0 - 149.0 mg/dL    Comment: Normal:  <150 mg/dLBorderline High:  150 - 199 mg/dL   HDL 40.30 >39.00 mg/dL   VLDL 30.4 0.0 - 40.0 mg/dL   LDL Cholesterol 152 (H) 0 - 99 mg/dL   Total CHOL/HDL Ratio  6     Comment:                Men          Women1/2 Average Risk     3.4          3.3Average Risk          5.0          4.42X Average Risk          9.6          7.13X Average Risk          15.0          11.0                       NonHDL 182.56     Comment: NOTE:  Non-HDL goal should be 30 mg/dL higher than patient's LDL goal (i.e. LDL goal of < 70 mg/dL, would have non-HDL goal of < 100 mg/dL)  Comprehensive metabolic panel     Status: None   Collection Time: 02/07/20  8:22 AM  Result Value Ref Range   Sodium 141 135 - 145 mEq/L   Potassium 4.4 3.5 - 5.1 mEq/L   Chloride 105 96 - 112 mEq/L   CO2 27 19 - 32 mEq/L   Glucose, Bld 86 70 - 99 mg/dL   BUN 8 6 - 23 mg/dL   Creatinine, Ser 0.83 0.40 - 1.50 mg/dL   Total Bilirubin 0.7 0.2 - 1.2 mg/dL   Alkaline Phosphatase 64 39 - 117 U/L   AST 22 0 - 37 U/L   ALT 35 0 - 53 U/L   Total Protein 6.5 6.0 - 8.3 g/dL   Albumin 4.2 3.5 - 5.2 g/dL   GFR 102.69 >60.00 mL/min     Comment: Calculated using the CKD-EPI Creatinine Equation (2021)   Calcium 9.2 8.4 - 10.5 mg/dL     Assessment & Plan:     ICD-10-CM   1. Cough, persistent -postinfectious  R05.3    Will likely last 8 to 12 weeks Trial of Breo Ellipta 1 inhalation daily  2. Opacities of both lungs present on chest x-ray  R91.8    Likely residual post COVID-19 pneumonia We will take 8 to 12 weeks to resolve radiographically Clinical improvement will precede radiographic resolution  3. Gastroesophageal reflux disease, unspecified whether esophagitis present  K21.9    Famotidine 40 mg at bedtime x3 to 4 weeks then DC  4. History of COVID-19  Z86.16 Pulmonary Function Test [facility] Only    DG Chest 2 View   Minor sequela from COVID-19 Check PFTs Follow-up chest x-ray   Orders Placed This Encounter  Procedures  . DG Chest 2 View    Standing Status:   Future    Standing Expiration Date:   08/18/2020    Order Specific Question:   Reason for Exam (SYMPTOM  OR DIAGNOSIS REQUIRED)    Answer:   f/u covid PNA    Order Specific Question:   Preferred imaging location?    Answer:   [facility]  . Pulmonary Function Test [facility] Only    Standing Status:   Future    Standing Expiration Date:   02/18/2021    Scheduling Instructions:     Next available.    Order Specific Question:   Full PFT: includes the following: basic spirometry, spirometry pre & post bronchodilator, diffusion capacity (DLCO), lung volumes    Answer:   Full PFT  Meds ordered this encounter  Medications  . fluticasone furoate-vilanterol (BREO ELLIPTA) 100-25 MCG/INH AEPB    Sig: Inhale 1 puff into the lungs daily for 1 day.    Dispense:  14 each    Refill:  0    Order Specific Question:   Lot Number?    Answer:   [id number]    Order Specific Question:   Expiration Date?    Answer:   09/24/2020    Order Specific Question:   Manufacturer?    Answer:   [employer] [[id number]]    Order Specific Question:   Quantity    Answer:   2   Patient  also instructed on famotidine 40 mg (OTC) at bedtime x3 to 4 weeks.  Discussion:  The patient has some mild persistent opacities noted on chest x-ray.  Note however that pneumonia takes anywhere between 8 to 12 weeks to radiographically improve.  The patient will have clinical improvement before radiographic improvement.  This is due to inflammatory changes that occur in the lung.  We will continue to follow these expectantly.  Will obtain pulmonary function testing to establish as baseline.  We will give the patient a trial of Breo Ellipta for his cough which is likely postinfectious cough.  This can also last anywhere between 6 to 12 weeks after infection.  He does have some issues with worsening reflux and this can aggravate his pulmonary issues so we will have him take famotidine 40 mg at bedtime for 3 to 4 weeks to see if this settles the symptom.  We will see him in follow-up in call sooner should any new problems arise.  We will obtain chest x-ray on follow-up visit.  Renold Don, MD  PCCM   *This note was dictated using voice recognition software/Dragon.  Despite best efforts to proofread, errors can occur which can change the meaning.  Any change was purely unintentional.

## 2020-02-19 NOTE — Patient Instructions (Signed)
We are going to get breathing test to check your lung function.  You are getting samples of Breo Ellipta 1 inhalation daily, rinse your mouth well after use.  You have enough for a month's supply.  Let us know how you do with this inhaler.  Over-the-counter you can get Pepcid (famotidine) 20 mg take 2 tablets at bedtime for the next 3 to 4 weeks and then stop.  We will see you in follow-up in 2 months time call sooner should any new problems arise.  We will get a chest x-ray on your return visit.

## 2020-03-16 DIAGNOSIS — N202 Calculus of kidney with calculus of ureter: Secondary | ICD-10-CM | POA: Diagnosis not present

## 2020-03-16 DIAGNOSIS — N5082 Scrotal pain: Secondary | ICD-10-CM | POA: Diagnosis not present

## 2020-03-16 DIAGNOSIS — R972 Elevated prostate specific antigen [PSA]: Secondary | ICD-10-CM | POA: Diagnosis not present

## 2020-03-27 ENCOUNTER — Telehealth: Payer: Self-pay | Admitting: Pulmonary Disease

## 2020-03-27 NOTE — Telephone Encounter (Signed)
Informed patient of Covid 19 test on 03/31/2020.

## 2020-03-27 NOTE — Telephone Encounter (Signed)
Lm for reminder of covid test prior to PFT.  03/[facility]

## 2020-03-27 NOTE — Telephone Encounter (Signed)
Noted.  Will close encounter.  

## 2020-03-31 ENCOUNTER — Other Ambulatory Visit: Payer: Self-pay

## 2020-03-31 ENCOUNTER — Other Ambulatory Visit
Admission: RE | Admit: 2020-03-31 | Discharge: 2020-03-31 | Disposition: A | Discharge status: Home / Self Care | Payer: BC Managed Care – PPO | Source: Ambulatory Visit | Attending: Pulmonary Disease | Admitting: Pulmonary Disease

## 2020-03-31 DIAGNOSIS — Z01812 Encounter for preprocedural laboratory examination: Secondary | ICD-10-CM | POA: Insufficient documentation

## 2020-03-31 DIAGNOSIS — Z20822 Contact with and (suspected) exposure to covid-19: Secondary | ICD-10-CM | POA: Insufficient documentation

## 2020-04-01 ENCOUNTER — Ambulatory Visit: Payer: BC Managed Care – PPO | Attending: Pulmonary Disease

## 2020-04-01 DIAGNOSIS — Z09 Encounter for follow-up examination after completed treatment for conditions other than malignant neoplasm: Secondary | ICD-10-CM | POA: Diagnosis not present

## 2020-04-01 DIAGNOSIS — Z8616 Personal history of COVID-19: Secondary | ICD-10-CM | POA: Insufficient documentation

## 2020-04-01 LAB — PULMONARY FUNCTION TEST ARMC ONLY
DL/VA % pred: 104 %
DL/VA: 4.47 ml/min/mmHg/L
DLCO unc % pred: 105 %
DLCO unc: 40.33 ml/min/mmHg
FEF 25-75 Post: 4.88 L/sec
FEF 25-75 Pre: 3.88 L/sec
FEF2575-%Change-Post: 25 %
FEF2575-%Pred-Post: 110 %
FEF2575-%Pred-Pre: 87 %
FEV1-%Change-Post: 13 %
FEV1-%Pred-Post: 97 %
FEV1-%Pred-Pre: 85 %
FEV1-Post: 5.06 L
FEV1-Pre: 4.45 L
FEV1FVC-%Change-Post: 12 %
FEV1FVC-%Pred-Pre: 88 %
FEV6-%Change-Post: 1 %
FEV6-%Pred-Post: 100 %
FEV6-%Pred-Pre: 99 %
FEV6-Post: 6.51 L
FEV6-Pre: 6.44 L
FEV6FVC-%Change-Post: 0 %
FEV6FVC-%Pred-Post: 103 %
FEV6FVC-%Pred-Pre: 103 %
FVC-%Change-Post: 0 %
FVC-%Pred-Post: 97 %
FVC-%Pred-Pre: 96 %
FVC-Post: 6.51 L
Post FEV1/FVC ratio: 78 %
Post FEV6/FVC ratio: 100 %
Pre FEV1/FVC ratio: 69 %
Pre FEV6/FVC Ratio: 100 %
RV % pred: 128 %
RV: 3.22 L
TLC % pred: 110 %
TLC: 9.7 L

## 2020-04-01 LAB — SARS CORONAVIRUS 2 (TAT 6-24 HRS): SARS Coronavirus 2: NEGATIVE

## 2020-04-01 MED ORDER — ALBUTEROL SULFATE (2.5 MG/3ML) 0.083% IN NEBU
2.5000 mg | INHALATION_SOLUTION | Freq: Once | RESPIRATORY_TRACT | Status: AC
  Administered 2020-04-01: 2.5 mg via RESPIRATORY_TRACT
  Filled 2020-04-01: qty 3

## 2020-04-21 ENCOUNTER — Other Ambulatory Visit: Payer: Self-pay

## 2020-04-21 ENCOUNTER — Encounter: Payer: Self-pay | Admitting: Pulmonary Disease

## 2020-04-21 ENCOUNTER — Ambulatory Visit (INDEPENDENT_AMBULATORY_CARE_PROVIDER_SITE_OTHER): Payer: BC Managed Care – PPO | Admitting: Pulmonary Disease

## 2020-04-21 VITALS — BP 122/82 | HR 61 | Temp 97.0°F | Ht 79.0 in | Wt 303.0 lb

## 2020-04-21 DIAGNOSIS — R053 Chronic cough: Secondary | ICD-10-CM

## 2020-04-21 DIAGNOSIS — Z8616 Personal history of COVID-19: Secondary | ICD-10-CM | POA: Diagnosis not present

## 2020-04-21 DIAGNOSIS — R918 Other nonspecific abnormal finding of lung field: Secondary | ICD-10-CM | POA: Diagnosis not present

## 2020-04-21 DIAGNOSIS — J4530 Mild persistent asthma, uncomplicated: Secondary | ICD-10-CM

## 2020-04-21 MED ORDER — BREO ELLIPTA 100-25 MCG/INH IN AEPB: 1.0000 | INHALATION_SPRAY | Freq: Every day | RESPIRATORY_TRACT | 3 refills | Status: DC

## 2020-04-21 NOTE — Patient Instructions (Signed)
You have mild asthma.  Continue Breo for now 1 puff daily, rinse your mouth well after you use it.  Let us know if you have trouble getting it.   We will see you in follow-up in 6 months time.  Call sooner if you have any other no issues.

## 2020-04-21 NOTE — Progress Notes (Signed)
Subjective:    Patient ID: Roger [name], male    DOB: Mar 13, 1970, 50 y.o.   MRN: [id number]  HPI Patient is a 50 year old (quit 2003,12-pack-year history) who initially was evaluated here on 19 February 2020 after COVID-19 infection with persistent right lung opacity.  He had been diagnosed with COVID-19 on 07 January 2020.  At that time he presented for evaluation of a persistent cough since the Covid infection.  Please refer to that note for details.  Patient was treated with Adair Patter which he noted gave him relief of his cough.  He had PFTs performed on 01 April 2020 with asthma.  FEV1 was 4.45 L or 85% predicted postbronchodilator 5.06 L or 97% predicted for a net change of 13%.  He had evidence of small airways component with bronchodilator response as well.  Consistent with asthma.  Since his initial visit he has been doing well particularly with the [facility].  His coughing resolved.  No shortness of breath.  Patient voices no other complaints.  He feels well and looks well.   Review of Systems A 10 point review of systems was performed and it is as noted above otherwise negative.  Patient Active Problem List   Diagnosis Date Noted  . Thoracic arthritis 02/14/2020  . Annual physical exam 02/12/2020  . 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 FH: prostate cancer 11/18/2019  . Testicular cyst 11/18/2019  . Pain in both testicles 11/13/2019  . FH: colon cancer 06/25/2019  . Bradycardia 01/03/2018  . Low back pain 12/13/2017  . Nonallopathic lesion of sacral region 12/13/2017  . Vitamin D deficiency 06/09/2017  . HLD (hyperlipidemia) 03/09/2017  . Sinus bradycardia 03/09/2017  . PVC's (premature ventricular contractions) 03/09/2017  . Premature atrial contraction 03/09/2017  . Vision problem 03/09/2017  . GERD (gastroesophageal reflux disease) 04/16/2015  . Obesity (BMI 30-39.9) 04/16/2015  . Nonallopathic lesion of thoracic region 04/16/2015  . Nonallopathic lesion of lumbosacral  region 04/16/2015   No Known Allergies   Current Meds  Medication Sig  . albuterol (VENTOLIN HFA) 108 (90 Base) MCG/ACT inhaler Inhale 2 puffs into the lungs every 6 (six) hours as needed for wheezing or shortness of breath.  . fluticasone (FLONASE) 50 MCG/ACT nasal spray Place 2 sprays into both nostrils daily.  . fluticasone furoate-vilanterol (BREO ELLIPTA) 100-25 MCG/INH AEPB Inhale 1 puff into the lungs daily.   Immunization History  Administered Date(s) Administered  . Hepatitis B, adult 06/09/2017, 07/11/2017  . Influenza-Unspecified 12/05/2017  . Tdap 06/05/2016    .    Objective:   Physical Exam BP 122/82 (BP Location: Left Arm, Patient Position: Sitting, Cuff Size: Normal)   Pulse 61   Temp (!) 97 F (36.1 C) (Temporal)   Ht 6\' 7"  (2.007 m)   Wt (!) 303 lb (137.4 kg)   SpO2 97%   BMI 34.13 kg/m  GENERAL: Muscular gentleman, no acute distress.  Fully ambulatory.  No conversational dyspnea. HEAD: Normocephalic, atraumatic.  EYES: Pupils equal, round, reactive to light.  No scleral icterus.  MOUTH: Nose/mouth/throat not examined due to masking requirements for COVID 19. NECK: Supple. No thyromegaly. Trachea midline. No JVD.  No adenopathy. PULMONARY: Good air entry bilaterally.  No adventitious sounds. CARDIOVASCULAR: S1 and S2. Regular rate and rhythm.  No rubs, murmurs or gallops heard. ABDOMEN: Benign. MUSCULOSKELETAL: No joint deformity, no clubbing, no edema.  NEUROLOGIC: No focal deficit, no gait disturbance, speech is fluent. SKIN: Intact,warm,dry. PSYCH: Mood and behavior normal  Assessment & Plan:     ICD-10-CM   1. Cough -resolved with bronchodilator  R05.3    Post infectious Also now shown to have asthma  2. Mild persistent asthma without complication  G50.03    Continue Breo 100/25, 1 inhalation daily Continue albuterol as needed Follow-up in 6 months time Reassess need for ongoing maintenance at that time  3. Opacities of both lungs  present on chest x-ray  R91.8    These were due to COVID Patient asymptomatic and with clear lung exam  4. History of COVID-19  Z86.16    Likely etiology of the lung opacities Clinically resolved    Meds ordered this encounter  Medications  . fluticasone furoate-vilanterol (BREO ELLIPTA) 100-25 MCG/INH AEPB    Sig: Inhale 1 puff into the lungs daily.    Dispense:  60 each    Refill:  3   We will see the patient in follow-up in 6 months time call sooner should any new problems arise.  Renold Don, MD [facility]  *This note was dictated using voice recognition software/Dragon.  Despite best efforts to proofread, errors can occur which can change the meaning.  Any change was purely unintentional.

## 2020-05-17 ENCOUNTER — Encounter: Payer: Self-pay | Admitting: Internal Medicine

## 2020-05-18 ENCOUNTER — Other Ambulatory Visit: Payer: Self-pay | Admitting: Internal Medicine

## 2020-05-18 DIAGNOSIS — K219 Gastro-esophageal reflux disease without esophagitis: Secondary | ICD-10-CM

## 2020-05-18 MED ORDER — PANTOPRAZOLE SODIUM 20 MG PO TBEC: 20.0000 mg | DELAYED_RELEASE_TABLET | ORAL | 3 refills | Status: DC

## 2020-05-19 ENCOUNTER — Encounter: Payer: Self-pay | Admitting: Family Medicine

## 2020-05-19 ENCOUNTER — Ambulatory Visit (INDEPENDENT_AMBULATORY_CARE_PROVIDER_SITE_OTHER): Payer: BC Managed Care – PPO | Admitting: Family Medicine

## 2020-05-19 ENCOUNTER — Other Ambulatory Visit: Payer: Self-pay

## 2020-05-19 VITALS — BP 136/84 | HR 67 | Ht 79.0 in | Wt 300.0 lb

## 2020-05-19 DIAGNOSIS — M9904 Segmental and somatic dysfunction of sacral region: Secondary | ICD-10-CM

## 2020-05-19 DIAGNOSIS — M9901 Segmental and somatic dysfunction of cervical region: Secondary | ICD-10-CM

## 2020-05-19 DIAGNOSIS — M545 Low back pain, unspecified: Secondary | ICD-10-CM

## 2020-05-19 DIAGNOSIS — M9903 Segmental and somatic dysfunction of lumbar region: Secondary | ICD-10-CM

## 2020-05-19 DIAGNOSIS — M9902 Segmental and somatic dysfunction of thoracic region: Secondary | ICD-10-CM

## 2020-05-19 DIAGNOSIS — G8929 Other chronic pain: Secondary | ICD-10-CM

## 2020-05-19 NOTE — Assessment & Plan Note (Signed)
Mild tightness in the lower back.  Nothing severe at this moment.  Patient is doing relatively well.  Responding well to the manipulation.  Do not feel that any imaging would change in management today.  Patient also does not seem to need any type of medication.  Follow-up with me again in 3 months

## 2020-05-19 NOTE — Patient Instructions (Addendum)
Good to see you Spenco Total Support MAX Continue everything else See me again in 3 months

## 2020-05-19 NOTE — Progress Notes (Signed)
[facility] [address] [place] Phone: [phone] Subjective:   I Roger [name] am serving as a Education administrator for Dr. Hulan Saas.  This visit occurred during the SARS-CoV-2 public health emergency.  Safety protocols were in place, including screening questions prior to the visit, additional usage of staff PPE, and extensive cleaning of exam room while observing appropriate contact time as indicated for disinfecting solutions.   I'm seeing this patient by the request  of:  McLean-Scocuzza, Nino Glow, MD  CC: Neck pain follow-up  [id number]  Roger [name] is a 50 y.o. male coming in with complaint of back and neck pain. OMT 02/17/2020. Patient states it is time for an adjustment.  Mild increase in low back pain.  Mild increase in neck pain.  Nothing no severe that stopping him from activities.  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  Medications patient has been prescribed: None  Taking:     Patient did have x-rays at last exam of the cervical spine were independently visualized by me showing the patient does have degenerative facet arthropathy at multiple levels mild to moderate.    Reviewed prior external information including notes and imaging from previsou exam, outside providers and external EMR if available.   As well as notes that were available from care everywhere and other healthcare systems.  Past medical history, social, surgical and family history all reviewed in electronic medical record.  No pertanent information unless stated regarding to the chief complaint.   Past Medical History:  Diagnosis Date  . Bradycardia   . Chicken pox   . COVID-19    01/07/20 [facility] s/p [facility] infusion 01/13/20 [facility] ED  . GERD (gastroesophageal reflux disease)   . Hiatal hernia    small noted 2013   . Hyperlipidemia   . Kidney stone    . Premature atrial contractions   . Premature ventricular contraction   . Vitamin D deficiency   . Wrist fracture    right     No Known Allergies   Review of Systems:  No headache, visual changes, nausea, vomiting, diarrhea, constipation, dizziness, abdominal pain, skin rash, fevers, chills, night sweats, weight loss, swollen lymph nodes, body aches, joint swelling, chest pain, shortness of breath, mood changes. POSITIVE muscle aches  Objective  Blood pressure 136/84, pulse 67, height 6\' 7"  (2.007 m), weight 300 lb (136.1 kg), SpO2 97 %.   General: No apparent distress alert and oriented x3 mood and affect normal, dressed appropriately.  HEENT: Pupils equal, extraocular movements intact  Respiratory: Patient's speak in full sentences and does not appear short of breath  Cardiovascular: No lower extremity edema, non tender, no erythema  Gait normal with good balance and coordination.  MSK:  Non tender with full range of motion and good stability and symmetric strength and tone of shoulders, elbows, wrist, hip, knee and ankles bilaterally.  Neck exam shows mild tightness noted with right-sided rotation and side and side bending.  Negative Spurling's.  No significant crepitus noted.  5-5 strength of the upper extremities. Low back exam shows does have some mild loss of lordosis.  Tightness noted on the right side of the paraspinal musculature.  No spinous process tenderness.  5-5 strength in lower extremities.  Osteopathic findings  C3 flexed rotated and side bent right T8 extended rotated and side bent right L2 flexed rotated  and side bent right Sacrum right on right       Assessment and Plan:  Low back pain Mild tightness in the lower back.  Nothing severe at this moment.  Patient is doing relatively well.  Responding well to the manipulation.  Do not feel that any imaging would change in management today.  Patient also does not seem to need any type of medication.  Follow-up  with me again in 3 months    Nonallopathic problems  Decision today to treat with OMT was based on Physical Exam  After verbal consent patient was treated with HVLA, ME, FPR techniques in cervical,  thoracic, lumbar, and sacral  areas  Patient tolerated the procedure well with improvement in symptoms  Patient given exercises, stretches and lifestyle modifications  See medications in patient instructions if given  Patient will follow up in 12 weeks      The above documentation has been reviewed and is accurate and complete Lyndal Pulley, DO       Note: This dictation was prepared with Dragon dictation along with smaller phrase technology. Any transcriptional errors that result from this process are unintentional.

## 2020-06-17 ENCOUNTER — Other Ambulatory Visit (INDEPENDENT_AMBULATORY_CARE_PROVIDER_SITE_OTHER): Payer: BC Managed Care – PPO

## 2020-06-17 ENCOUNTER — Other Ambulatory Visit: Payer: Self-pay

## 2020-06-17 DIAGNOSIS — J1282 Pneumonia due to coronavirus disease 2019: Secondary | ICD-10-CM | POA: Diagnosis not present

## 2020-06-17 DIAGNOSIS — U071 COVID-19: Secondary | ICD-10-CM | POA: Diagnosis not present

## 2020-06-17 DIAGNOSIS — E785 Hyperlipidemia, unspecified: Secondary | ICD-10-CM | POA: Diagnosis not present

## 2020-06-17 DIAGNOSIS — D72821 Monocytosis (symptomatic): Secondary | ICD-10-CM | POA: Diagnosis not present

## 2020-06-17 LAB — CBC WITH DIFFERENTIAL/PLATELET
Basophils Absolute: 0 10*3/uL (ref 0.0–0.1)
Basophils Relative: 0.5 % (ref 0.0–3.0)
Eosinophils Absolute: 0.1 10*3/uL (ref 0.0–0.7)
Eosinophils Relative: 2.8 % (ref 0.0–5.0)
HCT: 45.3 % (ref 39.0–52.0)
Hemoglobin: 15.4 g/dL (ref 13.0–17.0)
Lymphocytes Relative: 35.1 % (ref 12.0–46.0)
Lymphs Abs: 1.7 10*3/uL (ref 0.7–4.0)
MCHC: 34.1 g/dL (ref 30.0–36.0)
MCV: 87.9 fl (ref 78.0–100.0)
Monocytes Absolute: 0.5 10*3/uL (ref 0.1–1.0)
Monocytes Relative: 9.9 % (ref 3.0–12.0)
Neutro Abs: 2.5 10*3/uL (ref 1.4–7.7)
Neutrophils Relative %: 51.7 % (ref 43.0–77.0)
Platelets: 196 10*3/uL (ref 150.0–400.0)
RBC: 5.15 Mil/uL (ref 4.22–5.81)
RDW: 13.9 % (ref 11.5–15.5)
WBC: 4.8 10*3/uL (ref 4.0–10.5)

## 2020-06-17 LAB — LIPID PANEL
Cholesterol: 161 mg/dL (ref 0–200)
HDL: 34.4 mg/dL — ABNORMAL LOW (ref >39.00)
LDL Cholesterol: 109 mg/dL — ABNORMAL HIGH (ref 0–99)
NonHDL: 126.46
Total CHOL/HDL Ratio: 5
Triglycerides: 85 mg/dL (ref 0.0–149.0)
VLDL: 17 mg/dL (ref 0.0–40.0)

## 2020-06-18 ENCOUNTER — Other Ambulatory Visit: Payer: Self-pay

## 2020-06-18 ENCOUNTER — Ambulatory Visit (INDEPENDENT_AMBULATORY_CARE_PROVIDER_SITE_OTHER): Payer: BC Managed Care – PPO | Admitting: Internal Medicine

## 2020-06-18 ENCOUNTER — Encounter: Payer: Self-pay | Admitting: Internal Medicine

## 2020-06-18 VITALS — BP 134/82 | HR 83 | Temp 97.4°F | Ht 79.0 in | Wt 298.0 lb

## 2020-06-18 DIAGNOSIS — M25561 Pain in right knee: Secondary | ICD-10-CM | POA: Diagnosis not present

## 2020-06-18 DIAGNOSIS — R972 Elevated prostate specific antigen [PSA]: Secondary | ICD-10-CM

## 2020-06-18 DIAGNOSIS — R1084 Generalized abdominal pain: Secondary | ICD-10-CM

## 2020-06-18 DIAGNOSIS — Z8042 Family history of malignant neoplasm of prostate: Secondary | ICD-10-CM | POA: Diagnosis not present

## 2020-06-18 DIAGNOSIS — M25562 Pain in left knee: Secondary | ICD-10-CM

## 2020-06-18 DIAGNOSIS — G8929 Other chronic pain: Secondary | ICD-10-CM

## 2020-06-18 DIAGNOSIS — E669 Obesity, unspecified: Secondary | ICD-10-CM

## 2020-06-18 NOTE — Patient Instructions (Addendum)
Xrays both knees  CT abdomen pelvis   Probiotics  Water  Metamucil or benefiber  More fruits and veggies  Avoid dairy   Repeat PSA 08/06/20 call and reschedule urology appt for early to mid 08/2020  voltaren gel for knee pain Dr. Alvan Dame in [place], Dr. Francis [facility], or Dr. Barbette [facility]  Journal for Nurse Practitioners, 15(4), [phone]. Retrieved October 30, 2017 from [email or web address].[email or web address]">  Knee Exercises Ask your health care provider which exercises are safe for you. Do exercises exactly as told by your health care provider and adjust them as directed. It is normal to feel mild stretching, pulling, tightness, or discomfort as you do these exercises. Stop right away if you feel sudden pain or your pain gets worse. Do not begin these exercises until told by your health care provider. Stretching and range-of-motion exercises These exercises warm up your muscles and joints and improve the movement and flexibility of your knee. These exercises also help to relieve pain and swelling. Knee extension, prone 1. Lie on your abdomen (prone position) on a bed. 2. Place your left / right knee just beyond the edge of the surface so your knee is not on the bed. You can put a towel under your left / right thigh just above your kneecap for comfort. 3. Relax your leg muscles and allow gravity to straighten your knee (extension). You should feel a stretch behind your left / right knee. 4. Hold this position for __________ seconds. 5. Scoot up so your knee is supported between repetitions. Repeat __________ times. Complete this exercise __________ times a day. Knee flexion, active 1. Lie on your back with both legs straight. If this causes back discomfort, bend your left / right knee so your foot is flat on the floor. 2. Slowly slide your left / right heel back toward your buttocks. Stop when you feel a gentle stretch in the front of your knee or thigh  (flexion). 3. Hold this position for __________ seconds. 4. Slowly slide your left / right heel back to the starting position. Repeat __________ times. Complete this exercise __________ times a day.   Quadriceps stretch, prone 1. Lie on your abdomen on a firm surface, such as a bed or padded floor. 2. Bend your left / right knee and hold your ankle. If you cannot reach your ankle or pant leg, loop a belt around your foot and grab the belt instead. 3. Gently pull your heel toward your buttocks. Your knee should not slide out to the side. You should feel a stretch in the front of your thigh and knee (quadriceps). 4. Hold this position for __________ seconds. Repeat __________ times. Complete this exercise __________ times a day.   Hamstring, supine 1. Lie on your back (supine position). 2. Loop a belt or towel over the ball of your left / right foot. The ball of your foot is on the walking surface, right under your toes. 3. Straighten your left / right knee and slowly pull on the belt to raise your leg until you feel a gentle stretch behind your knee (hamstring). ? Do not let your knee bend while you do this. ? Keep your other leg flat on the floor. 4. Hold this position for __________ seconds. Repeat __________ times. Complete this exercise __________ times a day. Strengthening exercises These exercises build strength and endurance in your knee. Endurance is the ability to use your muscles for a long time, even after they get tired. Quadriceps, isometric  This exercise stretches the muscles in front of your thigh (quadriceps) without moving your knee joint (isometric). 1. Lie on your back with your left / right leg extended and your other knee bent. Put a rolled towel or small pillow under your knee if told by your health care provider. 2. Slowly tense the muscles in the front of your left / right thigh. You should see your kneecap slide up toward your hip or see increased dimpling just above  the knee. This motion will push the back of the knee toward the floor. 3. For __________ seconds, hold the muscle as tight as you can without increasing your pain. 4. Relax the muscles slowly and completely. Repeat __________ times. Complete this exercise __________ times a day.   Straight leg raises This exercise stretches the muscles in front of your thigh (quadriceps) and the muscles that move your hips (hip flexors). 1. Lie on your back with your left / right leg extended and your other knee bent. 2. Tense the muscles in the front of your left / right thigh. You should see your kneecap slide up or see increased dimpling just above the knee. Your thigh may even shake a bit. 3. Keep these muscles tight as you raise your leg 4-6 inches (10-15 cm) off the floor. Do not let your knee bend. 4. Hold this position for __________ seconds. 5. Keep these muscles tense as you lower your leg. 6. Relax your muscles slowly and completely after each repetition. Repeat __________ times. Complete this exercise __________ times a day. Hamstring, isometric 1. Lie on your back on a firm surface. 2. Bend your left / right knee about __________ degrees. 3. Dig your left / right heel into the surface as if you are trying to pull it toward your buttocks. Tighten the muscles in the back of your thighs (hamstring) to "dig" as hard as you can without increasing any pain. 4. Hold this position for __________ seconds. 5. Release the tension gradually and allow your muscles to relax completely for __________ seconds after each repetition. Repeat __________ times. Complete this exercise __________ times a day. Hamstring curls If told by your health care provider, do this exercise while wearing ankle weights. Begin with __________ lb weights. Then increase the weight by 1 lb (0.5 kg) increments. Do not wear ankle weights that are more than __________ lb. 1. Lie on your abdomen with your legs straight. 2. Bend your left /  right knee as far as you can without feeling pain. Keep your hips flat against the floor. 3. Hold this position for __________ seconds. 4. Slowly lower your leg to the starting position. Repeat __________ times. Complete this exercise __________ times a day.   Squats This exercise strengthens the muscles in front of your thigh and knee (quadriceps). 1. Stand in front of a table, with your feet and knees pointing straight ahead. You may rest your hands on the table for balance but not for support. 2. Slowly bend your knees and lower your hips like you are going to sit in a chair. ? Keep your weight over your heels, not over your toes. ? Keep your lower legs upright so they are parallel with the table legs. ? Do not let your hips go lower than your knees. ? Do not bend lower than told by your health care provider. ? If your knee pain increases, do not bend as low. 3. Hold the squat position for __________ seconds. 4. Slowly push with your legs  to return to standing. Do not use your hands to pull yourself to standing. Repeat __________ times. Complete this exercise __________ times a day. Wall slides This exercise strengthens the muscles in front of your thigh and knee (quadriceps). 1. Lean your back against a smooth wall or door, and walk your feet out 18-24 inches (46-61 cm) from it. 2. Place your feet hip-width apart. 3. Slowly slide down the wall or door until your knees bend __________ degrees. Keep your knees over your heels, not over your toes. Keep your knees in line with your hips. 4. Hold this position for __________ seconds. Repeat __________ times. Complete this exercise __________ times a day.   Straight leg raises This exercise strengthens the muscles that rotate the leg at the hip and move it away from your body (hip abductors). 1. Lie on your side with your left / right leg in the top position. Lie so your head, shoulder, knee, and hip line up. You may bend your bottom knee to  help you keep your balance. 2. Roll your hips slightly forward so your hips are stacked directly over each other and your left / right knee is facing forward. 3. Leading with your heel, lift your top leg 4-6 inches (10-15 cm). You should feel the muscles in your outer hip lifting. ? Do not let your foot drift forward. ? Do not let your knee roll toward the ceiling. 4. Hold this position for __________ seconds. 5. Slowly return your leg to the starting position. 6. Let your muscles relax completely after each repetition. Repeat __________ times. Complete this exercise __________ times a day.   Straight leg raises This exercise stretches the muscles that move your hips away from the front of the pelvis (hip extensors). 1. Lie on your abdomen on a firm surface. You can put a pillow under your hips if that is more comfortable. 2. Tense the muscles in your buttocks and lift your left / right leg about 4-6 inches (10-15 cm). Keep your knee straight as you lift your leg. 3. Hold this position for __________ seconds. 4. Slowly lower your leg to the starting position. 5. Let your leg relax completely after each repetition. Repeat __________ times. Complete this exercise __________ times a day. This information is not intended to replace advice given to you by your health care provider. Make sure you discuss any questions you have with your health care provider. Document Revised: 10/31/2017 Document Reviewed: 10/31/2017 Elsevier Patient Education  [address]   Gastroesophageal Reflux Disease, Adult Gastroesophageal reflux (GER) happens when acid from the stomach flows up into the tube that connects the mouth and the stomach (esophagus). Normally, food travels down the esophagus and stays in the stomach to be digested. However, when a person has GER, food and stomach acid sometimes move back up into the esophagus. If this becomes a more serious problem, the person may be diagnosed with a disease  called gastroesophageal reflux disease (GERD). GERD occurs when the reflux:  Happens often.  Causes frequent or severe symptoms.  Causes problems such as damage to the esophagus. When stomach acid comes in contact with the esophagus, the acid may cause inflammation in the esophagus. Over time, GERD may create small holes (ulcers) in the lining of the esophagus. What are the causes? This condition is caused by a problem with the muscle between the esophagus and the stomach (lower esophageal sphincter, or LES). Normally, the LES muscle closes after food passes through the esophagus to  the stomach. When the LES is weakened or abnormal, it does not close properly, and that allows food and stomach acid to go back up into the esophagus. The LES can be weakened by certain dietary substances, medicines, and medical conditions, including:  Tobacco use.  Pregnancy.  Having a hiatal hernia.  Alcohol use.  Certain foods and beverages, such as coffee, chocolate, onions, and peppermint. What increases the risk? You are more likely to develop this condition if you:  Have an increased body weight.  Have a connective tissue disorder.  Take NSAIDs, such as ibuprofen. What are the signs or symptoms? Symptoms of this condition include:  Heartburn.  Difficult or painful swallowing and the feeling of having a lump in the throat.  A bitter taste in the mouth.  Bad breath and having a large amount of saliva.  Having an upset or bloated stomach and belching.  Chest pain. Different conditions can cause chest pain. Make sure you see your health care provider if you experience chest pain.  Shortness of breath or wheezing.  Ongoing (chronic) cough or a nighttime cough.  Wearing away of tooth enamel.  Weight loss. How is this diagnosed? This condition may be diagnosed based on a medical history and a physical exam. To determine if you have mild or severe GERD, your health care provider may also  monitor how you respond to treatment. You may also have tests, including:  A test to examine your stomach and esophagus with a small camera (endoscopy).  A test that measures the acidity level in your esophagus.  A test that measures how much pressure is on your esophagus.  A barium swallow or modified barium swallow test to show the shape, size, and functioning of your esophagus. How is this treated? Treatment for this condition may vary depending on how severe your symptoms are. Your health care provider may recommend:  Changes to your diet.  Medicine.  Surgery. The goal of treatment is to help relieve your symptoms and to prevent complications. Follow these instructions at home: Eating and drinking  Follow a diet as recommended by your health care provider. This may involve avoiding foods and drinks such as: ? Coffee and tea, with or without caffeine. ? Drinks that contain alcohol. ? Energy drinks and sports drinks. ? Carbonated drinks or sodas. ? Chocolate and cocoa. ? Peppermint and mint flavorings. ? Garlic and onions. ? Horseradish. ? Spicy and acidic foods, including peppers, chili powder, curry powder, vinegar, hot sauces, and barbecue sauce. ? Citrus fruit juices and citrus fruits, such as oranges, lemons, and limes. ? Tomato-based foods, such as red sauce, chili, salsa, and pizza with red sauce. ? Fried and fatty foods, such as donuts, french fries, potato chips, and high-fat dressings. ? High-fat meats, such as hot dogs and fatty cuts of red and white meats, such as rib eye steak, sausage, ham, and bacon. ? High-fat dairy items, such as whole milk, butter, and cream cheese.  Eat small, frequent meals instead of large meals.  Avoid drinking large amounts of liquid with your meals.  Avoid eating meals during the 2-3 hours before bedtime.  Avoid lying down right after you eat.  Do not exercise right after you eat.   Lifestyle  Do not use any products that  contain nicotine or tobacco. These products include cigarettes, chewing tobacco, and vaping devices, such as e-cigarettes. If you need help quitting, ask your health care provider.  Try to reduce your stress by using methods such  as yoga or meditation. If you need help reducing stress, ask your health care provider.  If you are overweight, reduce your weight to an amount that is healthy for you. Ask your health care provider for guidance about a safe weight loss goal.   General instructions  Pay attention to any changes in your symptoms.  Take over-the-counter and prescription medicines only as told by your health care provider. Do not take aspirin, ibuprofen, or other NSAIDs unless your health care provider told you to take these medicines.  Wear loose-fitting clothing. Do not wear anything tight around your waist that causes pressure on your abdomen.  Raise (elevate) the head of your bed about 6 inches (15 cm). You can use a wedge to do this.  Avoid bending over if this makes your symptoms worse.  Keep all follow-up visits. This is important. Contact a health care provider if:  You have: ? New symptoms. ? Unexplained weight loss. ? Difficulty swallowing or it hurts to swallow. ? Wheezing or a persistent cough. ? A hoarse voice.  Your symptoms do not improve with treatment. Get help right away if:  You have sudden pain in your arms, neck, jaw, teeth, or back.  You suddenly feel sweaty, dizzy, or light-headed.  You have chest pain or shortness of breath.  You vomit and the vomit is green, yellow, or black, or it looks like blood or coffee grounds.  You faint.  You have stool that is red, bloody, or black.  You cannot swallow, drink, or eat. These symptoms may represent a serious problem that is an emergency. Do not wait to see if the symptoms will go away. Get medical help right away. Call your local emergency services (911 in the [place] Do not drive yourself to the  hospital. Summary  Gastroesophageal reflux happens when acid from the stomach flows up into the esophagus. GERD is a disease in which the reflux happens often, causes frequent or severe symptoms, or causes problems such as damage to the esophagus.  Treatment for this condition may vary depending on how severe your symptoms are. Your health care provider may recommend diet and lifestyle changes, medicine, or surgery.  Contact a health care provider if you have new or worsening symptoms.  Take over-the-counter and prescription medicines only as told by your health care provider. Do not take aspirin, ibuprofen, or other NSAIDs unless your health care provider told you to do so.  Keep all follow-up visits as told by your health care provider. This is important. This information is not intended to replace advice given to you by your health care provider. Make sure you discuss any questions you have with your health care provider. Document Revised: 07/22/2019 Document Reviewed: 07/22/2019 Elsevier Patient Education  2021 [place] for Gastroesophageal Reflux Disease, Adult When you have gastroesophageal reflux disease (GERD), the foods you eat and your eating habits are very important. Choosing the right foods can help ease the discomfort of GERD. Consider working with a dietitian to help you make healthy food choices. What are tips for following this plan? Reading food labels  Look for foods that are low in saturated fat. Foods that have less than 5% of daily value (DV) of fat and 0 g of trans fats may help with your symptoms. Cooking  Cook foods using methods other than frying. This may include baking, steaming, grilling, or broiling. These are all methods that do not need a lot of fat for cooking.  To  add flavor, try to use herbs that are low in spice and acidity. Meal planning  Choose healthy foods that are low in fat, such as fruits, vegetables, whole grains, low-fat  dairy products, lean meats, fish, and poultry.  Eat frequent, small meals instead of three large meals each day. Eat your meals slowly, in a relaxed setting. Avoid bending over or lying down until 2-3 hours after eating.  Limit high-fat foods such as fatty meats or fried foods.  Limit your intake of fatty foods, such as oils, butter, and shortening.  Avoid the following as told by your health care provider: ? Foods that cause symptoms. These may be different for different people. Keep a food diary to keep track of foods that cause symptoms. ? Alcohol. ? Drinking large amounts of liquid with meals. ? Eating meals during the 2-3 hours before bed.   Lifestyle  Maintain a healthy weight. Ask your health care provider what weight is healthy for you. If you need to lose weight, work with your health care provider to do so safely.  Exercise for at least 30 minutes on 5 or more days each week, or as told by your health care provider.  Avoid wearing clothes that fit tightly around your waist and chest.  Do not use any products that contain nicotine or tobacco. These products include cigarettes, chewing tobacco, and vaping devices, such as e-cigarettes. If you need help quitting, ask your health care provider.  Sleep with the head of your bed raised. Use a wedge under the mattress or blocks under the bed frame to raise the head of the bed.  Chew sugar-free gum after mealtimes. What foods should I eat? Eat a healthy, well-balanced diet of fruits, vegetables, whole grains, low-fat dairy products, lean meats, fish, and poultry. Each person is different. Foods that may trigger symptoms in one person may not trigger any symptoms in another person. Work with your health care provider to identify foods that are safe for you. The items listed above may not be a complete list of recommended foods and beverages. Contact a dietitian for more information.   What foods should I avoid? Limiting some of these  foods may help manage the symptoms of GERD. Everyone is different. Consult a dietitian or your health care provider to help you identify the exact foods to avoid, if any. Fruits Any fruits prepared with added fat. Any fruits that cause symptoms. For some people this may include citrus fruits, such as oranges, grapefruit, pineapple, and lemons. Vegetables Deep-fried vegetables. [place] fries. Any vegetables prepared with added fat. Any vegetables that cause symptoms. For some people, this may include tomatoes and tomato products, chili peppers, onions and garlic, and horseradish. Grains Pastries or quick breads with added fat. Meats and other proteins High-fat meats, such as fatty beef or pork, hot dogs, ribs, ham, sausage, salami, and bacon. Fried meat or protein, including fried fish and fried chicken. Nuts and nut butters, in large amounts. Dairy Whole milk and chocolate milk. Sour cream. Cream. Ice cream. Cream cheese. Milkshakes. Fats and oils Butter. Margarine. Shortening. Ghee. Beverages Coffee and tea, with or without caffeine. Carbonated beverages. Sodas. Energy drinks. Fruit juice made with acidic fruits, such as orange or grapefruit. Tomato juice. Alcoholic drinks. Sweets and desserts Chocolate and cocoa. Donuts. Seasonings and condiments Pepper. Peppermint and spearmint. Added salt. Any condiments, herbs, or seasonings that cause symptoms. For some people, this may include curry, hot sauce, or vinegar-based salad dressings. The items listed above may  not be a complete list of foods and beverages to avoid. Contact a dietitian for more information. Questions to ask your health care provider Diet and lifestyle changes are usually the first steps that are taken to manage symptoms of GERD. If diet and lifestyle changes do not improve your symptoms, talk with your health care provider about taking medicines. Where to find more information  [employer] for Gastrointestinal  Disorders: aboutgerd.org Summary  When you have gastroesophageal reflux disease (GERD), food and lifestyle choices may be very helpful in easing the discomfort of GERD.  Eat frequent, small meals instead of three large meals each day. Eat your meals slowly, in a relaxed setting. Avoid bending over or lying down until 2-3 hours after eating.  Limit high-fat foods such as fatty meats or fried foods. This information is not intended to replace advice given to you by your health care provider. Make sure you discuss any questions you have with your health care provider. Document Revised: 07/22/2019 Document Reviewed: 07/22/2019 Elsevier Patient Education  [place].

## 2020-06-18 NOTE — Progress Notes (Addendum)
Chief Complaint  Patient presents with   Follow-up   GI Problem    Abdominal discomfort ongoing since having Covid. Bowel movements used to be regular everyday. Patient still has BM daily but now at random times, irregular.  Along with decreased appetite and weight gain.    F/u  1. Abdominal pain epigastric and RLQ no weight loss weight gain noticed since had covid 01/07/20 had a cousin age 50 recently die of pancreatitic cancer no n/v but had reduced appetite and early satiety  Change in bowel habits as well used to stool daily in the am now sometimes in the pm  2. Reviewed labs resolved monocytosis and cholesterol improved 3. B/l knee pain chronic nothing tried mild to moderate  4. Elevated PSA FH prostate cancer dad will repeat PSA move urology appt back [facility] to 08/2020  Will repeat PSA 08/06/20 6 month f/u  5. Obesity gaining wt not exercising at home on the scale wt is 283 lbs today   Review of Systems  Constitutional: Negative for weight loss.  HENT: Negative for hearing loss.   Eyes: Negative for blurred vision.  Respiratory: Negative for shortness of breath.   Cardiovascular: Negative for chest pain.  Gastrointestinal: Positive for abdominal pain. Negative for constipation and diarrhea.       +early satiety   Musculoskeletal: Negative for falls and joint pain.  Skin: Negative for rash.  Neurological: Negative for headaches.  Psychiatric/Behavioral: Negative for depression.   Past Medical History:  Diagnosis Date   Bradycardia    Chicken pox    COVID-19    01/07/20 [facility] s/p MAB infusion 01/13/20 [facility] ED   GERD (gastroesophageal reflux disease)    Hiatal hernia    small noted 2013    Hyperlipidemia    Kidney stone    Premature atrial contractions    Premature ventricular contraction    Vitamin D deficiency    Wrist fracture    right    Past Surgical History:  Procedure Laterality Date   LASIK     2004    TONSILLECTOMY AND ADENOIDECTOMY   1978   Family History  Problem Relation Age of Onset   Cancer Mother        kidney cancer    Kidney disease Mother    Diabetes Mother    Hearing loss Mother    Heart disease Mother    Cancer Father        prostate, colon    Kidney disease Father    Diabetes Father    Hearing loss Father    Colon cancer Father 45   Colon polyps Father    Stroke Maternal Grandmother    Cancer Maternal Grandfather        bladder cancer, +smoker, +prostate    Alcohol abuse Maternal Grandfather    Colon cancer Maternal Grandfather    Stomach cancer Other    Rectal cancer Other    Testicular cancer Cousin        M cousin   Pancreatic cancer Son        died age 39   Esophageal cancer Neg Hx    Social History   Socioeconomic History   Marital status: Married    Spouse name: Not on file   Number of children: Not on file   Years of education: Not on file   Highest education level: Not on file  Occupational History   Not on file  Tobacco Use   Smoking status: Former Smoker  Packs/day: 1.00    Years: 12.00    Pack years: 12.00    Types: Cigarettes    Quit date: 2003    Years since quitting: 19.4   Smokeless tobacco: Former Systems developer    Types: Chew   Tobacco comment: Quit 20 years ago.  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    Feels safe in relationship, wears seat belt, owns guns in home    Social Determinants of Health   Financial Resource Strain: Not on file  Food Insecurity: Not on file  Transportation Needs: Not on file  Physical Activity: Not on file  Stress: Not on file  Social Connections: Not on file  Intimate Partner Violence: Not on file   Current Meds   Medication Sig   fluticasone (FLONASE) 50 MCG/ACT nasal spray Place 2 sprays into both nostrils daily.   pantoprazole (PROTONIX) 20 MG tablet Take 1 tablet (20 mg total) by mouth every morning. 30 minutes before food   No Known Allergies Recent Results (from the past 2160 hour(s))  SARS CORONAVIRUS 2 (TAT 6-24 HRS) Nasopharyngeal Nasopharyngeal Swab     Status: None   Collection Time: 03/31/20  2:10 PM   Specimen: Nasopharyngeal Swab  Result Value Ref Range   SARS Coronavirus 2 NEGATIVE NEGATIVE    Comment: (NOTE) SARS-CoV-2 target nucleic acids are NOT DETECTED.  The SARS-CoV-2 RNA is generally detectable in upper and lower respiratory specimens during the acute phase of infection. Negative results do not preclude SARS-CoV-2 infection, do not rule out co-infections with other pathogens, and should not be used as the sole basis for treatment or other patient management decisions. Negative results must be combined with clinical observations, patient history, and epidemiological information. The expected result is Negative.  Fact Sheet for Patients: SugarRoll.be  Fact Sheet for Healthcare Providers: https://www.woods-mathews.com/  This test is not yet approved or cleared by the [place] FDA and  has been authorized for detection and/or diagnosis of SARS-CoV-2 by FDA under an Emergency Use Authorization (EUA). This EUA will remain  in effect (meaning this test can be used) for the duration of the COVID-19 declaration under Se ction 564(b)(1) of the Act, 21 U.S.C. section 360bbb-3(b)(1), unless the authorization is terminated or revoked sooner.  Performed at [facility], [place] [address], [place], [place] [address]   Pulmonary Function Test [facility] Only     Status: None (Preliminary result)   Collection Time: 04/01/20  3:54 PM  Result Value Ref Range   FVC-%Pred-Pre 96 %   FVC-Post 6.51 L   FVC-%Pred-Post 97 %    FVC-%Change-Post 0 %   FEV1-Pre 4.45 L   FEV1-%Pred-Pre 85 %   FEV1-Post 5.06 L   FEV1-%Pred-Post 97 %   FEV1-%Change-Post 13 %   FEV6-Pre 6.44 L   FEV6-%Pred-Pre 99 %   FEV6-Post 6.51 L   FEV6-%Pred-Post 100 %   FEV6-%Change-Post 1 %   Pre FEV1/FVC ratio 69 %   FEV1FVC-%Pred-Pre 88 %   Post FEV1/FVC ratio 78 %   FEV1FVC-%Change-Post 12 %   Pre FEV6/FVC Ratio 100 %  FEV6FVC-%Pred-Pre 103 %   Post FEV6/FVC ratio 100 %   FEV6FVC-%Pred-Post 103 %   FEV6FVC-%Change-Post 0 %   FEF 25-75 Pre 3.88 L/sec   FEF2575-%Pred-Pre 87 %   FEF 25-75 Post 4.88 L/sec   FEF2575-%Pred-Post 110 %   FEF2575-%Change-Post 25 %   RV 3.22 L   RV % pred 128 %   TLC 9.70 L   TLC % pred 110 %   DLCO unc 40.33 ml/min/mmHg   DLCO unc % pred 105 %   DL/VA 4.47 ml/min/mmHg/L   DL/VA % pred 104 %  CBC with Differential/Platelet     Status: None   Collection Time: 06/17/20  8:10 AM  Result Value Ref Range   WBC 4.8 4.0 - 10.5 K/uL   RBC 5.15 4.22 - 5.81 Mil/uL   Hemoglobin 15.4 13.0 - 17.0 g/dL   HCT 45.3 39.0 - 52.0 %   MCV 87.9 78.0 - 100.0 fl   MCHC 34.1 30.0 - 36.0 g/dL   RDW 13.9 11.5 - 15.5 %   Platelets 196.0 150.0 - 400.0 K/uL   Neutrophils Relative % 51.7 43.0 - 77.0 %   Lymphocytes Relative 35.1 12.0 - 46.0 %   Monocytes Relative 9.9 3.0 - 12.0 %   Eosinophils Relative 2.8 0.0 - 5.0 %   Basophils Relative 0.5 0.0 - 3.0 %   Neutro Abs 2.5 1.4 - 7.7 K/uL   Lymphs Abs 1.7 0.7 - 4.0 K/uL   Monocytes Absolute 0.5 0.1 - 1.0 K/uL   Eosinophils Absolute 0.1 0.0 - 0.7 K/uL   Basophils Absolute 0.0 0.0 - 0.1 K/uL  Lipid panel     Status: Abnormal   Collection Time: 06/17/20  8:10 AM  Result Value Ref Range   Cholesterol 161 0 - 200 mg/dL    Comment: ATP III Classification       Desirable:  < 200 mg/dL               Borderline High:  200 - 239 mg/dL          High:  > = 240 mg/dL   Triglycerides 85.0 0.0 - 149.0 mg/dL    Comment: Normal:  <150 mg/dLBorderline High:  150 - 199 mg/dL   HDL  34.40 (L) >39.00 mg/dL   VLDL 17.0 0.0 - 40.0 mg/dL   LDL Cholesterol 109 (H) 0 - 99 mg/dL   Total CHOL/HDL Ratio 5     Comment:                Men          Women1/2 Average Risk     3.4          3.3Average Risk          5.0          4.42X Average Risk          9.6          7.13X Average Risk          15.0          11.0                       NonHDL 126.46     Comment: NOTE:  Non-HDL goal should be 30 mg/dL higher than patient's LDL goal (i.e. LDL goal of < 70 mg/dL, would have non-HDL goal of < 100 mg/dL)   Objective  Body mass index is 33.57 kg/m. Wt Readings from Last 3  Encounters:  06/18/20 298 lb (135.2 kg)  05/19/20 300 lb (136.1 kg)  04/21/20 (!) 303 lb (137.4 kg)   Temp Readings from Last 3 Encounters:  06/18/20 (!) 97.4 F (36.3 C) (Temporal)  04/21/20 (!) 97 F (36.1 C) (Temporal)  02/19/20 (!) 97.3 F (36.3 C) (Temporal)   BP Readings from Last 3 Encounters:  06/18/20 134/82  05/19/20 136/84  04/21/20 122/82   Pulse Readings from Last 3 Encounters:  06/18/20 83  05/19/20 67  04/21/20 61    Physical Exam Vitals and nursing note reviewed.  Constitutional:      Appearance: Normal appearance. He is well-developed and well-groomed. He is obese.  HENT:     Head: Normocephalic and atraumatic.  Eyes:     Conjunctiva/sclera: Conjunctivae normal.     Pupils: Pupils are equal, round, and reactive to light.  Cardiovascular:     Rate and Rhythm: Normal rate and regular rhythm.     Heart sounds: Normal heart sounds. No murmur heard.   Abdominal:     General: Abdomen is flat. Bowel sounds are normal.     Tenderness: There is abdominal tenderness in the right lower quadrant and epigastric area.  Musculoskeletal:     Right knee: Tenderness present over the medial joint line and lateral joint line.     Left knee: Tenderness present over the lateral joint line.  Skin:    General: Skin is warm and moist.  Neurological:     General: No focal deficit present.     Mental  Status: He is alert and oriented to person, place, and time. Mental status is at baseline.     Gait: Gait normal.  Psychiatric:        Attention and Perception: Attention and perception normal.        Mood and Affect: Mood and affect normal.        Speech: Speech normal.        Behavior: Behavior normal. Behavior is cooperative.        Thought Content: Thought content normal.        Cognition and Memory: Cognition and memory normal.        Judgment: Judgment normal.     Assessment  Plan  Generalized abdominal pain - Plan: CT Abdomen Pelvis Wo Contrast  Elevated PSA - Plan: PSA repeat PSA 08/06/20 likely elevated due to covid but FH prostate cancer in dad  Chronic pain of both knees - Plan: DG Knee Complete 4 Views Left, DG Knee Complete 4 Views Right Consider ortho let me know about referral  voltaren gel for knee pain Dr. Alvan Dame in [place], Dr. Francis Dowse [facility], or Dr. Barbette [facility]  Obesity (BMI 30-39.9)  rec healthy diet and exercise   MH-due 02/11/21 Declines flu shot, covid shot declines though consider in the future covid 19 +01/07/20 Hep B immune rec mmr vaccine in future Tdap had 06/05/16  Had 2/3 hep B vaccines immune as  Of 06/21/2019 titer >1000 protected  FH prostate cancer in dad last PSA 2.89 02/07/20<0.81  06/21/19 PSA 06/25/20 0.91 returned to baseline  est.  [facility] Dr. Gloriann Loan f/u 08/2020     Testosterone normal 11/02/17     Colonoscopy 10/25/19 polyps sessile hyperplastic f/u 5 years 10/24/24   Former smoker quit 15-20 years ago smoker x 10 years 1/2 ppd max 1 ppd no FH lung cancer Former chew quit as of 06/25/19  -Congratulated on quitting both  rec healthy diet and exercise  11/14/19 testicle US   FINDINGS: Right testicle   Measurements: 5.1 x 2.7 x 3.8 cm. No mass or microlithiasis visualized.   Left testicle   Measurements: 4.6 x 2.9 x 3.3 cm. No mass or microlithiasis visualized.   Right epididymis:   Scattered small epididymal cysts are noted.   Left epididymis:  Normal in size and appearance.   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   Varicocele:  None visualized.   Pulsed Doppler interrogation of both testes demonstrates normal low resistance arterial and venous waveforms bilaterally.   IMPRESSION: Bilateral varicoceles.   Small right epididymal cyst.   Normal-appearing testicles. Electronically Signed   By: Inez Catalina M.D.   On: 11/14/2019 16:52      Provider: Dr. Olivia Mackie McLean-Scocuzza-[facility]

## 2020-06-19 ENCOUNTER — Ambulatory Visit: Payer: BC Managed Care – PPO | Admitting: Internal Medicine

## 2020-06-19 ENCOUNTER — Telehealth: Payer: Self-pay | Admitting: Internal Medicine

## 2020-06-19 NOTE — Telephone Encounter (Signed)
Received call that CT Abdomen/Pelvis without contrast was approved.   Verification number: [id number]

## 2020-06-19 NOTE — Telephone Encounter (Signed)
Opened in error

## 2020-06-25 DIAGNOSIS — Z125 Encounter for screening for malignant neoplasm of prostate: Secondary | ICD-10-CM | POA: Diagnosis not present

## 2020-07-01 ENCOUNTER — Ambulatory Visit
Admission: RE | Admit: 2020-07-01 | Discharge: 2020-07-01 | Disposition: A | Discharge status: Home / Self Care | Payer: BC Managed Care – PPO | Source: Ambulatory Visit | Attending: Internal Medicine | Admitting: Internal Medicine

## 2020-07-01 ENCOUNTER — Ambulatory Visit: Admission: RE | Admit: 2020-07-01 | Payer: BC Managed Care – PPO | Source: Ambulatory Visit

## 2020-07-01 ENCOUNTER — Other Ambulatory Visit: Payer: Self-pay

## 2020-07-01 DIAGNOSIS — N281 Cyst of kidney, acquired: Secondary | ICD-10-CM | POA: Diagnosis not present

## 2020-07-01 DIAGNOSIS — R1084 Generalized abdominal pain: Secondary | ICD-10-CM

## 2020-07-01 DIAGNOSIS — M47816 Spondylosis without myelopathy or radiculopathy, lumbar region: Secondary | ICD-10-CM | POA: Diagnosis not present

## 2020-07-01 DIAGNOSIS — K449 Diaphragmatic hernia without obstruction or gangrene: Secondary | ICD-10-CM | POA: Diagnosis not present

## 2020-07-01 DIAGNOSIS — K5790 Diverticulosis of intestine, part unspecified, without perforation or abscess without bleeding: Secondary | ICD-10-CM | POA: Diagnosis not present

## 2020-07-02 DIAGNOSIS — R972 Elevated prostate specific antigen [PSA]: Secondary | ICD-10-CM | POA: Diagnosis not present

## 2020-07-02 DIAGNOSIS — N5082 Scrotal pain: Secondary | ICD-10-CM | POA: Diagnosis not present

## 2020-07-10 ENCOUNTER — Other Ambulatory Visit: Payer: BC Managed Care – PPO

## 2020-08-03 NOTE — Progress Notes (Signed)
[place] [address] [place] Phone: [phone] Subjective:   I Roger [name] am serving as a Education administrator for Dr. Hulan Saas.  This visit occurred during the SARS-CoV-2 public health emergency.  Safety protocols were in place, including screening questions prior to the visit, additional usage of staff PPE, and extensive cleaning of exam room while observing appropriate contact time as indicated for disinfecting solutions.   I'm seeing this patient by the request  of:  McLean-Scocuzza, Roger Glow, MD  CC: Neck pain follow-up  [id number]  Roger [name] is a 50 y.o. male coming in with complaint of back and neck pain. OMT 05/19/2020. Patient states it is time for an adjustment. Believes he is making some progress but after about 3-4 months everything tightens up again.  Medications patient has been prescribed: None         Past Medical History:  Diagnosis Date   Bradycardia    Chicken pox    COVID-19    01/07/20 with covid pneumonia [facility] s/p MAB infusion 01/13/20 [facility] ED   GERD (gastroesophageal reflux disease)    Hiatal hernia    small noted 2013    Hyperlipidemia    Kidney stone    Premature atrial contractions    Premature ventricular contraction    Vitamin D deficiency    Wrist fracture    right     No Known Allergies   Review of Systems:  No headache, visual changes, nausea, vomiting, diarrhea, constipation, dizziness, abdominal pain, skin rash, fevers, chills, night sweats, weight loss, swollen lymph nodes, body aches, joint swelling, chest pain, shortness of breath, mood changes. POSITIVE muscle aches  Objective  Blood pressure (!) 142/84, pulse (!) 52, height 6\' 7"  (2.007 m), weight 299 lb (135.6 kg), SpO2 98 %.   General: No apparent distress alert and oriented x3 mood and affect normal, dressed appropriately.  HEENT: Pupils equal, extraocular movements intact  Respiratory: Patient's speak in full  sentences and does not appear short of breath  Cardiovascular: No lower extremity edema, non tender, no erythema  Neck exam does have some loss of lordosis.  Limited sidebending bilaterally and limited right-sided rotation.  Mild crepitus noted.  Mild tightness in the paraspinal musculature as well as some mild pain in the sacroiliac joint with tightness with FABER test on the right side.  5 out of 5 strength of all the extremities.  Foot exam does show the patient does have mild breakdown of transverse and longitudinal arch.  Osteopathic findings  C2 flexed rotated and side bent right C6 flexed rotated and side bent left T6 extended rotated and side bent left L2 flexed rotated and side bent right Sacrum right on right   [id number]; 15 additional minutes spent for Therapeutic exercises as stated in above notes.  This included exercises focusing on stretching, strengthening, with significant focus on eccentric aspects.   Long term goals include an improvement in range of motion, strength, endurance as well as avoiding reinjury. Patient's frequency would include in 1-2 times a day, 3-5 times a week for a duration of 6-12 weeks. Exercises for the foot include:  Stretches to help lengthen the lower leg and plantar fascia areas Theraband exercises for the lower leg and ankle to help strengthen the surrounding area- dorsiflexion, plantarflexion, inversion, eversion Massage rolling on the plantar surface of the foot with a frozen bottle, tennis ball or golf ball Towel or marble pick-ups to strengthen the plantar surface of  the foot Weight bearing exercises to increase balance and overall stability   Proper technique shown and discussed handout in great detail with ATC.  All questions were discussed and answered.      Assessment and Plan:  Low back pain Has had low back pain as well as some neck pain.  Responds very well to osteopathic manipulation.  This seems to be more maintenance every 3 to 4  months.  Discussed the over-the-counter medications.  Discussed the importance of proper shoes.  Follow-up with me again in 3 months  Plantar fasciitis We reviewed that stretching is critically important to the treatment of PF.  Reviewed footwear. Rigid soles have been shown to help with PF. Night splints can sometimes help. Reviewed rehab of stretching and calf raises.   Could benefit from a corticosteroid injection, orthotics, or other measures if conservative treatment fails.   Nonallopathic problems  Decision today to treat with OMT was based on Physical Exam  After verbal consent patient was treated with HVLA, ME, FPR techniques in cervical,  thoracic, lumbar, and sacral  areas  Patient tolerated the procedure well with improvement in symptoms  Patient given exercises, stretches and lifestyle modifications  See medications in patient instructions if given  Patient will follow up in 4-8 weeks      The above documentation has been reviewed and is accurate and complete Roger Pulley, DO        Note: This dictation was prepared with Dragon dictation along with smaller phrase technology. Any transcriptional errors that result from this process are unintentional.

## 2020-08-06 ENCOUNTER — Other Ambulatory Visit: Payer: BC Managed Care – PPO

## 2020-08-07 ENCOUNTER — Encounter: Payer: Self-pay | Admitting: Family Medicine

## 2020-08-07 ENCOUNTER — Ambulatory Visit (INDEPENDENT_AMBULATORY_CARE_PROVIDER_SITE_OTHER): Payer: BC Managed Care – PPO | Admitting: Family Medicine

## 2020-08-07 ENCOUNTER — Other Ambulatory Visit: Payer: Self-pay

## 2020-08-07 VITALS — BP 142/84 | HR 52 | Ht 79.0 in | Wt 299.0 lb

## 2020-08-07 DIAGNOSIS — M545 Low back pain, unspecified: Secondary | ICD-10-CM | POA: Diagnosis not present

## 2020-08-07 DIAGNOSIS — M9902 Segmental and somatic dysfunction of thoracic region: Secondary | ICD-10-CM | POA: Diagnosis not present

## 2020-08-07 DIAGNOSIS — M9904 Segmental and somatic dysfunction of sacral region: Secondary | ICD-10-CM

## 2020-08-07 DIAGNOSIS — G8929 Other chronic pain: Secondary | ICD-10-CM

## 2020-08-07 DIAGNOSIS — M722 Plantar fascial fibromatosis: Secondary | ICD-10-CM | POA: Insufficient documentation

## 2020-08-07 DIAGNOSIS — M9903 Segmental and somatic dysfunction of lumbar region: Secondary | ICD-10-CM | POA: Diagnosis not present

## 2020-08-07 DIAGNOSIS — M9901 Segmental and somatic dysfunction of cervical region: Secondary | ICD-10-CM

## 2020-08-07 NOTE — Assessment & Plan Note (Signed)
Has had low back pain as well as some neck pain.  Responds very well to osteopathic manipulation.  This seems to be more maintenance every 3 to 4 months.  Discussed the over-the-counter medications.  Discussed the importance of proper shoes.  Follow-up with me again in 3 months

## 2020-08-07 NOTE — Patient Instructions (Addendum)
Good to see you Plantar fascia exercises Hoka or oofos recover sandals Spenco orthotics "total support" See me again in 3-4 months

## 2020-08-07 NOTE — Assessment & Plan Note (Signed)
We reviewed that stretching is critically important to the treatment of PF.  Reviewed footwear. Rigid soles have been shown to help with PF. Night splints can sometimes help. Reviewed rehab of stretching and calf raises.   Could benefit from a corticosteroid injection, orthotics, or other measures if conservative treatment fails.

## 2020-08-18 ENCOUNTER — Other Ambulatory Visit: Payer: BC Managed Care – PPO

## 2020-09-08 ENCOUNTER — Other Ambulatory Visit: Payer: Self-pay | Admitting: Internal Medicine

## 2020-09-08 DIAGNOSIS — K219 Gastro-esophageal reflux disease without esophagitis: Secondary | ICD-10-CM

## 2020-09-09 ENCOUNTER — Other Ambulatory Visit: Payer: Self-pay

## 2020-09-09 ENCOUNTER — Ambulatory Visit (INDEPENDENT_AMBULATORY_CARE_PROVIDER_SITE_OTHER): Payer: BC Managed Care – PPO | Admitting: Dermatology

## 2020-09-09 DIAGNOSIS — L821 Other seborrheic keratosis: Secondary | ICD-10-CM

## 2020-09-09 DIAGNOSIS — L814 Other melanin hyperpigmentation: Secondary | ICD-10-CM | POA: Diagnosis not present

## 2020-09-09 DIAGNOSIS — L918 Other hypertrophic disorders of the skin: Secondary | ICD-10-CM

## 2020-09-09 DIAGNOSIS — D1800 Hemangioma unspecified site: Secondary | ICD-10-CM

## 2020-09-09 DIAGNOSIS — D1809 Hemangioma of other sites: Secondary | ICD-10-CM | POA: Diagnosis not present

## 2020-09-09 DIAGNOSIS — L578 Other skin changes due to chronic exposure to nonionizing radiation: Secondary | ICD-10-CM

## 2020-09-09 DIAGNOSIS — D229 Melanocytic nevi, unspecified: Secondary | ICD-10-CM

## 2020-09-09 DIAGNOSIS — Z1283 Encounter for screening for malignant neoplasm of skin: Secondary | ICD-10-CM

## 2020-09-09 DIAGNOSIS — D485 Neoplasm of uncertain behavior of skin: Secondary | ICD-10-CM

## 2020-09-09 NOTE — Patient Instructions (Signed)

## 2020-09-09 NOTE — Progress Notes (Signed)
Follow-Up Visit   Subjective  Roger [name] is a 50 y.o. male who presents for the following: Annual Exam (Patient has noticed some lesions on his scalp that he would like checked today.). The patient presents for Total-Body Skin Exam (TBSE) for skin cancer screening and mole check.  The following portions of the chart were reviewed this encounter and updated as appropriate:   Tobacco  Allergies  Meds  Problems  Med Hx  Surg Hx  Fam Hx     Review of Systems:  No other skin or systemic complaints except as noted in HPI or Assessment and Plan.  Objective  Well appearing patient in no apparent distress; mood and affect are within normal limits.  A full examination was performed including scalp, head, eyes, ears, nose, lips, neck, chest, axillae, abdomen, back, buttocks, bilateral upper extremities, bilateral lower extremities, hands, feet, fingers, toes, fingernails, and toenails. All findings within normal limits unless otherwise noted below.   Assessment & Plan  Neoplasm of uncertain behavior of skin R scalp  Epidermal / dermal shaving  Lesion diameter (cm):  0.6 Informed consent: discussed and consent obtained   Timeout: patient name, date of birth, surgical site, and procedure verified   Procedure prep:  Patient was prepped and draped in usual sterile fashion Prep type:  Isopropyl alcohol Anesthesia: the lesion was anesthetized in a standard fashion   Anesthetic:  1% lidocaine w/ epinephrine 1-100,000 buffered w/ 8.4% NaHCO3 Instrument used: flexible razor blade   Hemostasis achieved with: pressure, aluminum chloride and electrodesiccation   Outcome: patient tolerated procedure well   Post-procedure details: sterile dressing applied and wound care instructions given   Dressing type: bandage and petrolatum    Specimen 1 - Surgical pathology Differential Diagnosis: D48.5 hemangioma vs other  Check Margins: No  Skin cancer screening  Lentigines - Scattered tan  macules - Due to sun exposure - Benign-appering, observe - Recommend daily broad spectrum sunscreen SPF 30+ to sun-exposed areas, reapply every 2 hours as needed. - Call for any changes  Seborrheic Keratoses - Stuck-on, waxy, tan-brown papules and/or plaques  - Benign-appearing - Discussed benign etiology and prognosis. - Observe - Call for any changes  Melanocytic Nevi - Tan-brown and/or pink-flesh-colored symmetric macules and papules - Benign appearing on exam today - Observation - Call clinic for new or changing moles - Recommend daily use of broad spectrum spf 30+ sunscreen to sun-exposed areas.   Hemangiomas - Red papules - Discussed benign nature - Observe - Call for any changes  Actinic Damage - Chronic condition, secondary to cumulative UV/sun exposure - diffuse scaly erythematous macules with underlying dyspigmentation - Recommend daily broad spectrum sunscreen SPF 30+ to sun-exposed areas, reapply every 2 hours as needed.  - Staying in the shade or wearing long sleeves, sun glasses (UVA+UVB protection) and wide brim hats (4-inch brim around the entire circumference of the hat) are also recommended for sun protection.  - Call for new or changing lesions.  Acrochordons (Skin Tags) - Fleshy, skin-colored pedunculated papules - Benign appearing.  - Observe. - If desired, they can be removed with an in office procedure that is not covered by insurance. - Please call the clinic if you notice any new or changing lesions.  Skin cancer screening performed today.  Return in about 1 year (around 09/09/2021) for TBSE.  Luther Redo, CMA, am acting as scribe for Sarina Ser, MD . Documentation: I have reviewed the above documentation for accuracy and completeness, and I agree  with the above.  Sarina Ser, MD

## 2020-09-13 ENCOUNTER — Encounter: Payer: Self-pay | Admitting: Dermatology

## 2020-09-15 ENCOUNTER — Telehealth: Payer: Self-pay

## 2020-09-15 NOTE — Telephone Encounter (Signed)
Discussed biopsy results with pt  °

## 2020-09-15 NOTE — Telephone Encounter (Signed)
-----   Message from Ralene Bathe, MD sent at 09/15/2020 11:40 AM EDT ----- Diagnosis Skin , right scalp HEMANGIOMA  Benign hemangioma No further treatment needed

## 2020-09-29 DIAGNOSIS — H52223 Regular astigmatism, bilateral: Secondary | ICD-10-CM | POA: Diagnosis not present

## 2020-10-05 DIAGNOSIS — H903 Sensorineural hearing loss, bilateral: Secondary | ICD-10-CM | POA: Diagnosis not present

## 2020-11-18 ENCOUNTER — Ambulatory Visit: Payer: BC Managed Care – PPO | Admitting: Family Medicine

## 2020-12-16 ENCOUNTER — Encounter: Payer: Self-pay | Admitting: Internal Medicine

## 2020-12-16 NOTE — Telephone Encounter (Signed)
Please advise, last labs done May 2022 and January 2022. Does Patient need labs?

## 2020-12-21 ENCOUNTER — Other Ambulatory Visit: Payer: Self-pay

## 2020-12-21 DIAGNOSIS — Z1329 Encounter for screening for other suspected endocrine disorder: Secondary | ICD-10-CM

## 2020-12-21 DIAGNOSIS — E785 Hyperlipidemia, unspecified: Secondary | ICD-10-CM

## 2020-12-21 DIAGNOSIS — R972 Elevated prostate specific antigen [PSA]: Secondary | ICD-10-CM

## 2020-12-21 DIAGNOSIS — E559 Vitamin D deficiency, unspecified: Secondary | ICD-10-CM

## 2020-12-21 DIAGNOSIS — Z1389 Encounter for screening for other disorder: Secondary | ICD-10-CM

## 2020-12-21 DIAGNOSIS — Z0000 Encounter for general adult medical examination without abnormal findings: Secondary | ICD-10-CM

## 2020-12-21 NOTE — Addendum Note (Signed)
Addended by: Orland Mustard on: 12/21/2020 10:08 AM   Modules accepted: Orders

## 2020-12-23 ENCOUNTER — Ambulatory Visit: Payer: BC Managed Care – PPO | Admitting: Internal Medicine

## 2020-12-28 ENCOUNTER — Ambulatory Visit: Payer: BC Managed Care – PPO | Admitting: Family Medicine

## 2021-01-11 ENCOUNTER — Encounter: Payer: Self-pay | Admitting: Internal Medicine

## 2021-01-14 ENCOUNTER — Telehealth (INDEPENDENT_AMBULATORY_CARE_PROVIDER_SITE_OTHER): Payer: BC Managed Care – PPO | Admitting: Internal Medicine

## 2021-01-14 ENCOUNTER — Encounter: Payer: Self-pay | Admitting: Internal Medicine

## 2021-01-14 ENCOUNTER — Other Ambulatory Visit: Payer: Self-pay

## 2021-01-14 VITALS — Ht 79.0 in | Wt 295.0 lb

## 2021-01-14 DIAGNOSIS — J029 Acute pharyngitis, unspecified: Secondary | ICD-10-CM | POA: Diagnosis not present

## 2021-01-14 MED ORDER — AMOXICILLIN-POT CLAVULANATE 875-125 MG PO TABS: 1.0000 | ORAL_TABLET | Freq: Two times a day (BID) | ORAL | 0 refills | Status: DC

## 2021-01-14 NOTE — Progress Notes (Signed)
Telephone Note  I connected with Roger [name]  on 01/14/21 at 10:20 AM EST by telephone  and verified that I am speaking with the correct person using two identifiers.  Location patient: work Environmental manager or home office Persons participating in the virtual visit: patient, provider  I discussed the limitations of evaluation and management by telemedicine and the availability of in person appointments. The patient expressed understanding and agreed to proceed.   HPI:  Acute telemedicine visit for : -sore throat x 3 weeks and dry cough tried mucinex dm and vitamin C w/o help took 2 covid 19 tests and negative wife tested + covid 2-3 weeks ago  Throat is red no white, no swelling in neck, throat is sore   -COVID-19 vaccine status:declines  ROS: See pertinent positives and negatives per HPI.  Past Medical History:  Diagnosis Date   Bradycardia    Chicken pox    COVID-19    01/07/20 with covid pneumonia [facility] s/p MAB infusion 01/13/20 [facility] ED   GERD (gastroesophageal reflux disease)    Hiatal hernia    small noted 2013    Hyperlipidemia    Kidney stone    Premature atrial contractions    Premature ventricular contraction    Vitamin D deficiency    Wrist fracture    right     Past Surgical History:  Procedure Laterality Date   LASIK     2004    TONSILLECTOMY AND ADENOIDECTOMY  1978     Current Outpatient Medications:    amoxicillin-clavulanate (AUGMENTIN) 875-125 MG tablet, Take 1 tablet by mouth 2 (two) times daily. With food, Disp: 14 tablet, Rfl: 0   pantoprazole (PROTONIX) 20 MG tablet, Take 1 tablet (20 mg total) by mouth every morning. 30 minutes before food, Disp: 90 tablet, Rfl: 3   tadalafil (CIALIS) 5 MG tablet, Take 5 mg by mouth daily., Disp: , Rfl:   EXAM:  VITALS per patient if applicable:  GENERAL: alert, oriented, appears well and in no acute distress   PSYCH/NEURO: pleasant and cooperative, no obvious depression or anxiety,  speech and thought processing grossly intact  ASSESSMENT AND PLAN:  Discussed the following assessment and plan:  Sore throat - Plan: amoxicillin-clavulanate (AUGMENTIN) 875-125 MG tablet bid x 7 days   Multivitamin  cepacol or chloroseptic spray  Warm tea with honey and lemon  Warm salt water gargles with hydrogen peroxide  Hydration  Try to eat though you dont feel like it   Tylenol or Advil as needed for   -we discussed possible serious and likely etiologies, options for evaluation and workup, limitations of telemedicine visit vs in person visit, treatment, treatment risks and precautions. Pt is agreeable to treatment via telemedicine at this moment.    I discussed the assessment and treatment plan with the patient. The patient was provided an opportunity to ask questions and all were answered. The patient agreed with the plan and demonstrated an understanding of the instructions.    Time spent 20 minutes  Delorise Jackson, MD

## 2021-01-14 NOTE — Progress Notes (Signed)
Symptom onset 3 weeks ago. No one around the Patient is sick. Sore throat with dry cough and horse voice. Negative home Covid test

## 2021-01-14 NOTE — Patient Instructions (Addendum)
Multivitamin  cepacol or chloroseptic spray  Warm tea with honey and lemon  Warm salt water gargles with hydrogen peroxide  Hydration  Try to eat though you dont feel like it   Tylenol or Advil as needed for

## 2021-01-21 NOTE — Progress Notes (Deleted)
[place] Phone: [phone] Subjective:    I'm seeing this patient by the request  of:  [name], Roger Glow, MD  CC:   EPP:[id number]  Roger [name] is a 50 y.o. male coming in with complaint of back and neck pain. OMT on 08/07/2020. Patient states   Medications patient has been prescribed: None  Taking:         Reviewed prior external information including notes and imaging from previsou exam, outside providers and external EMR if available.   As well as notes that were available from care everywhere and other healthcare systems.  Past medical history, social, surgical and family history all reviewed in electronic medical record.  No pertanent information unless stated regarding to the chief complaint.   Past Medical History:  Diagnosis Date   Bradycardia    Chicken pox    COVID-19    01/07/20 with covid pneumonia [facility] s/p MAB infusion 01/13/20 [facility] ED   GERD (gastroesophageal reflux disease)    Hiatal hernia    small noted 2013    Hyperlipidemia    Kidney stone    Premature atrial contractions    Premature ventricular contraction    Vitamin D deficiency    Wrist fracture    right     No Known Allergies   Review of Systems:  No headache, visual changes, nausea, vomiting, diarrhea, constipation, dizziness, abdominal pain, skin rash, fevers, chills, night sweats, weight loss, swollen lymph nodes, body aches, joint swelling, chest pain, shortness of breath, mood changes. POSITIVE muscle aches  Objective  There were no vitals taken for this visit.   General: No apparent distress alert and oriented x3 mood and affect normal, dressed appropriately.  HEENT: Pupils equal, extraocular movements intact  Respiratory: Patient's speak in full sentences and does not appear short of breath  Cardiovascular: No lower extremity edema, non tender, no erythema  Neuro: Cranial nerves II  through XII are intact, neurovascularly intact in all extremities with 2+ DTRs and 2+ pulses.  Gait normal with good balance and coordination.  MSK:  Non tender with full range of motion and good stability and symmetric strength and tone of shoulders, elbows, wrist, hip, knee and ankles bilaterally.  Back - Normal skin, Spine with normal alignment and no deformity.  No tenderness to vertebral process palpation.  Paraspinous muscles are not tender and without spasm.   Range of motion is full at neck and lumbar sacral regions  Osteopathic findings  C2 flexed rotated and side bent right C6 flexed rotated and side bent left T3 extended rotated and side bent right inhaled rib T9 extended rotated and side bent left L2 flexed rotated and side bent right Sacrum right on right       Assessment and Plan:    Nonallopathic problems  Decision today to treat with OMT was based on Physical Exam  After verbal consent patient was treated with HVLA, ME, FPR techniques in cervical, rib, thoracic, lumbar, and sacral  areas  Patient tolerated the procedure well with improvement in symptoms  Patient given exercises, stretches and lifestyle modifications  See medications in patient instructions if given  Patient will follow up in 4-8 weeks      The above documentation has been reviewed and is accurate and complete Roger [name]       Note: This dictation was prepared with Diplomatic Services operational officer dictation along with smaller Company secretary. Any transcriptional errors that result  from this process are unintentional.

## 2021-01-26 ENCOUNTER — Ambulatory Visit: Payer: BC Managed Care – PPO | Admitting: Family Medicine

## 2021-01-27 ENCOUNTER — Ambulatory Visit: Payer: BC Managed Care – PPO | Admitting: Internal Medicine

## 2021-02-05 ENCOUNTER — Encounter: Payer: Self-pay | Admitting: Internal Medicine

## 2021-02-10 ENCOUNTER — Encounter: Payer: Self-pay | Admitting: Internal Medicine

## 2021-02-25 ENCOUNTER — Other Ambulatory Visit: Payer: Self-pay

## 2021-02-25 ENCOUNTER — Other Ambulatory Visit (INDEPENDENT_AMBULATORY_CARE_PROVIDER_SITE_OTHER): Payer: BC Managed Care – PPO

## 2021-02-25 DIAGNOSIS — Z0000 Encounter for general adult medical examination without abnormal findings: Secondary | ICD-10-CM

## 2021-02-25 DIAGNOSIS — E559 Vitamin D deficiency, unspecified: Secondary | ICD-10-CM | POA: Diagnosis not present

## 2021-02-25 DIAGNOSIS — Z1329 Encounter for screening for other suspected endocrine disorder: Secondary | ICD-10-CM | POA: Diagnosis not present

## 2021-02-25 DIAGNOSIS — E785 Hyperlipidemia, unspecified: Secondary | ICD-10-CM

## 2021-02-25 DIAGNOSIS — Z1389 Encounter for screening for other disorder: Secondary | ICD-10-CM | POA: Diagnosis not present

## 2021-02-25 DIAGNOSIS — R972 Elevated prostate specific antigen [PSA]: Secondary | ICD-10-CM | POA: Diagnosis not present

## 2021-02-25 LAB — COMPREHENSIVE METABOLIC PANEL
ALT: 46 U/L (ref 0–53)
AST: 31 U/L (ref 0–37)
Albumin: 4.8 g/dL (ref 3.5–5.2)
Alkaline Phosphatase: 55 U/L (ref 39–117)
BUN: 13 mg/dL (ref 6–23)
CO2: 28 mEq/L (ref 19–32)
Calcium: 9.7 mg/dL (ref 8.4–10.5)
Chloride: 104 mEq/L (ref 96–112)
Creatinine, Ser: 0.99 mg/dL (ref 0.40–1.50)
GFR: 88.72 mL/min (ref >60.00)
Glucose, Bld: 78 mg/dL (ref 70–99)
Potassium: 4.2 mEq/L (ref 3.5–5.1)
Sodium: 140 mEq/L (ref 135–145)
Total Bilirubin: 1 mg/dL (ref 0.2–1.2)
Total Protein: 7.1 g/dL (ref 6.0–8.3)

## 2021-02-25 LAB — CBC WITH DIFFERENTIAL/PLATELET
Basophils Absolute: 0 10*3/uL (ref 0.0–0.1)
Basophils Relative: 0.4 % (ref 0.0–3.0)
Eosinophils Absolute: 0.1 10*3/uL (ref 0.0–0.7)
Eosinophils Relative: 1.4 % (ref 0.0–5.0)
HCT: 45.5 % (ref 39.0–52.0)
Hemoglobin: 15.3 g/dL (ref 13.0–17.0)
Lymphocytes Relative: 31.4 % (ref 12.0–46.0)
Lymphs Abs: 1.8 10*3/uL (ref 0.7–4.0)
MCHC: 33.5 g/dL (ref 30.0–36.0)
MCV: 88.4 fl (ref 78.0–100.0)
Monocytes Absolute: 0.5 10*3/uL (ref 0.1–1.0)
Monocytes Relative: 8.4 % (ref 3.0–12.0)
Neutro Abs: 3.4 10*3/uL (ref 1.4–7.7)
Neutrophils Relative %: 58.4 % (ref 43.0–77.0)
Platelets: 175 10*3/uL (ref 150.0–400.0)
RBC: 5.15 Mil/uL (ref 4.22–5.81)
RDW: 14.3 % (ref 11.5–15.5)
WBC: 5.8 10*3/uL (ref 4.0–10.5)

## 2021-02-25 LAB — URINALYSIS, ROUTINE W REFLEX MICROSCOPIC
Bilirubin Urine: NEGATIVE
Ketones, ur: NEGATIVE
Leukocytes,Ua: NEGATIVE
Nitrite: NEGATIVE
RBC / HPF: NONE SEEN (ref 0–?)
Specific Gravity, Urine: 1.025 (ref 1.000–1.030)
Total Protein, Urine: NEGATIVE
Urine Glucose: NEGATIVE
Urobilinogen, UA: 0.2 (ref 0.0–1.0)
pH: 5.5 (ref 5.0–8.0)

## 2021-02-25 LAB — TSH: TSH: 3.06 u[IU]/mL (ref 0.35–5.50)

## 2021-02-25 LAB — VITAMIN D 25 HYDROXY (VIT D DEFICIENCY, FRACTURES): VITD: 28.56 ng/mL — ABNORMAL LOW (ref 30.00–100.00)

## 2021-02-25 LAB — LIPID PANEL
Cholesterol: 198 mg/dL (ref 0–200)
HDL: 38.4 mg/dL — ABNORMAL LOW (ref >39.00)
LDL Cholesterol: 132 mg/dL — ABNORMAL HIGH (ref 0–99)
NonHDL: 159.55
Total CHOL/HDL Ratio: 5
Triglycerides: 138 mg/dL (ref 0.0–149.0)
VLDL: 27.6 mg/dL (ref 0.0–40.0)

## 2021-02-25 LAB — PSA: PSA: 1.24 ng/mL (ref 0.10–4.00)

## 2021-02-26 IMAGING — DX DG CHEST 2V
2 series · 2 of 2 positions shown · non-contrast
Comparison: January 13, 2020

CLINICAL DATA: History of COVID pneumonia.

EXAM:
CHEST - 2 VIEW

[chest pa]
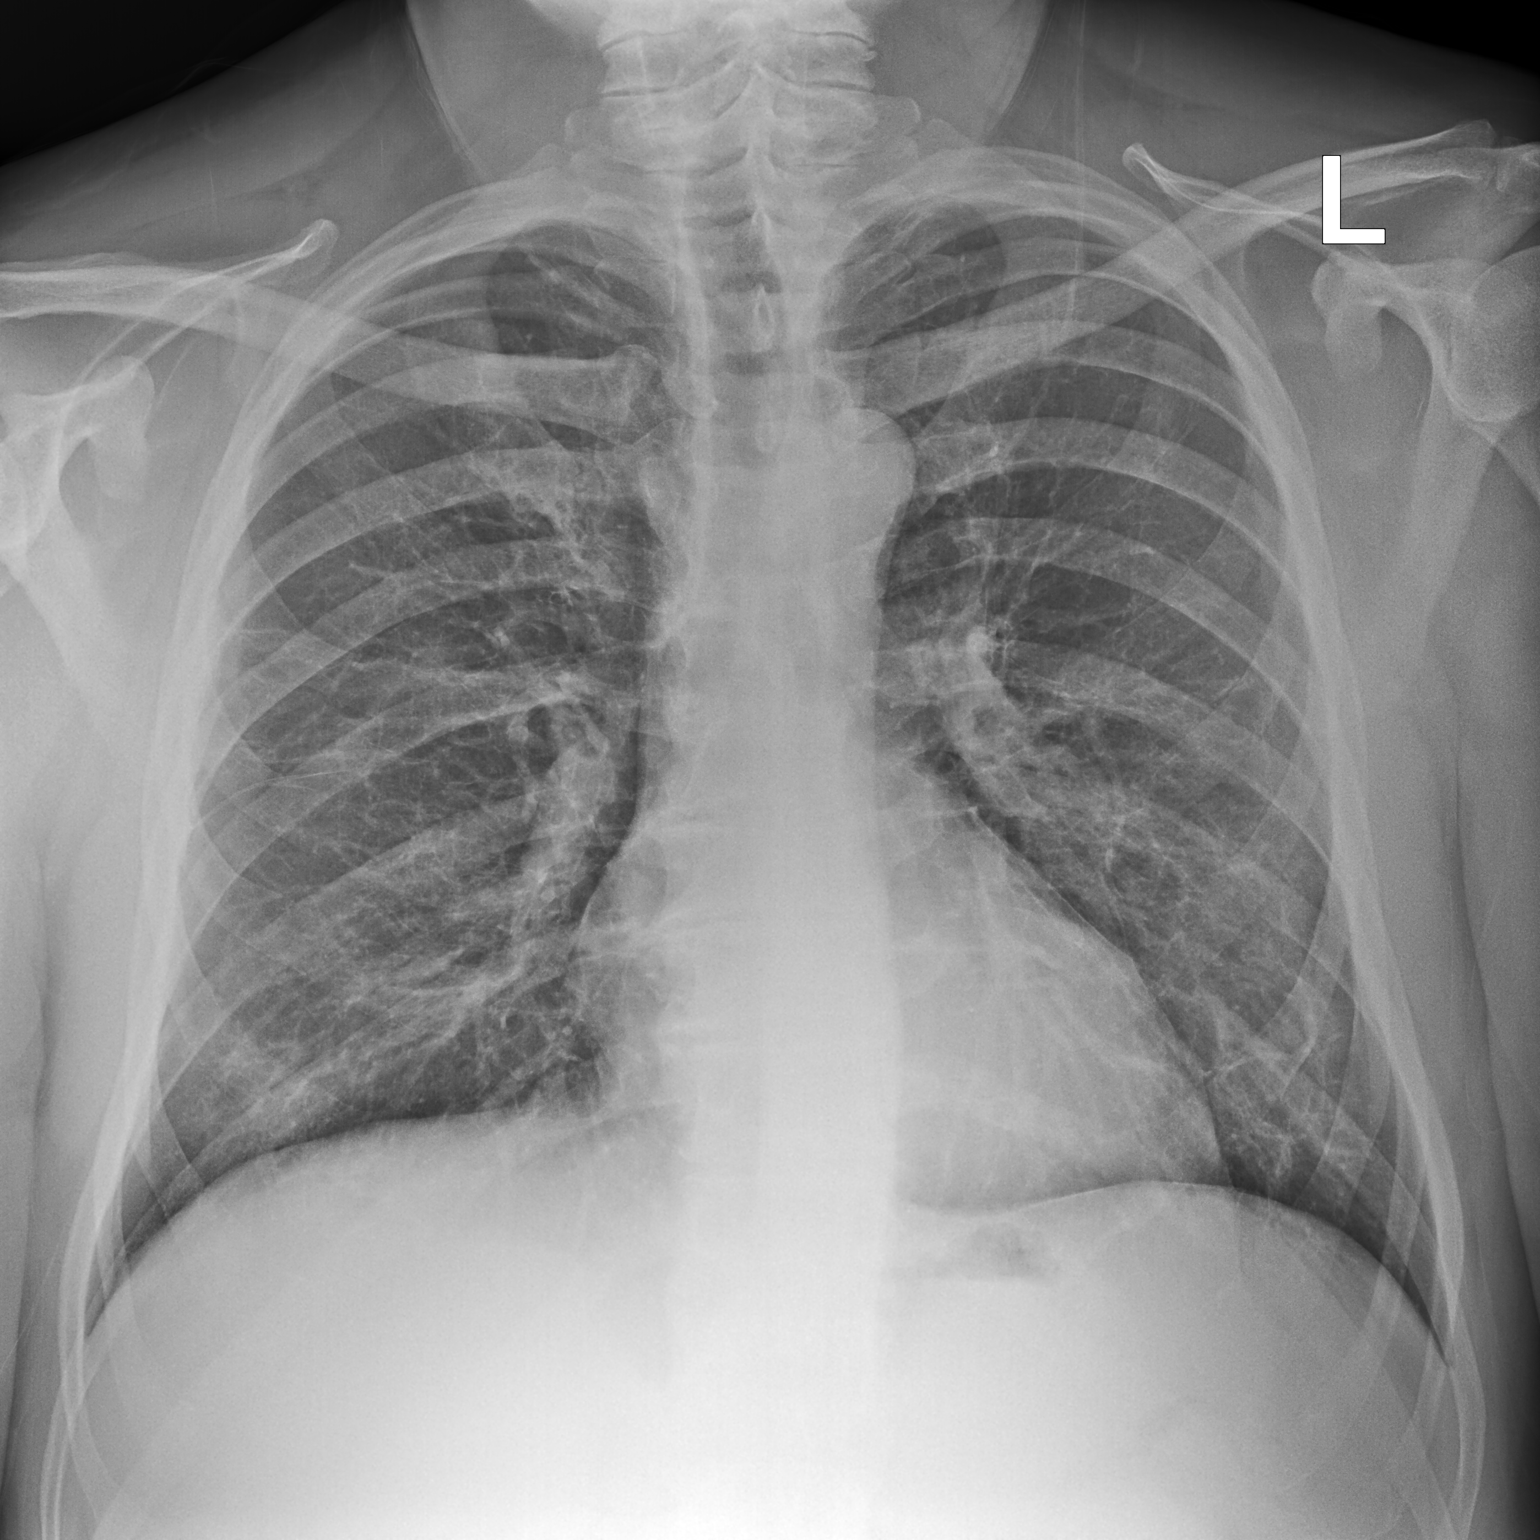

[chest lat]
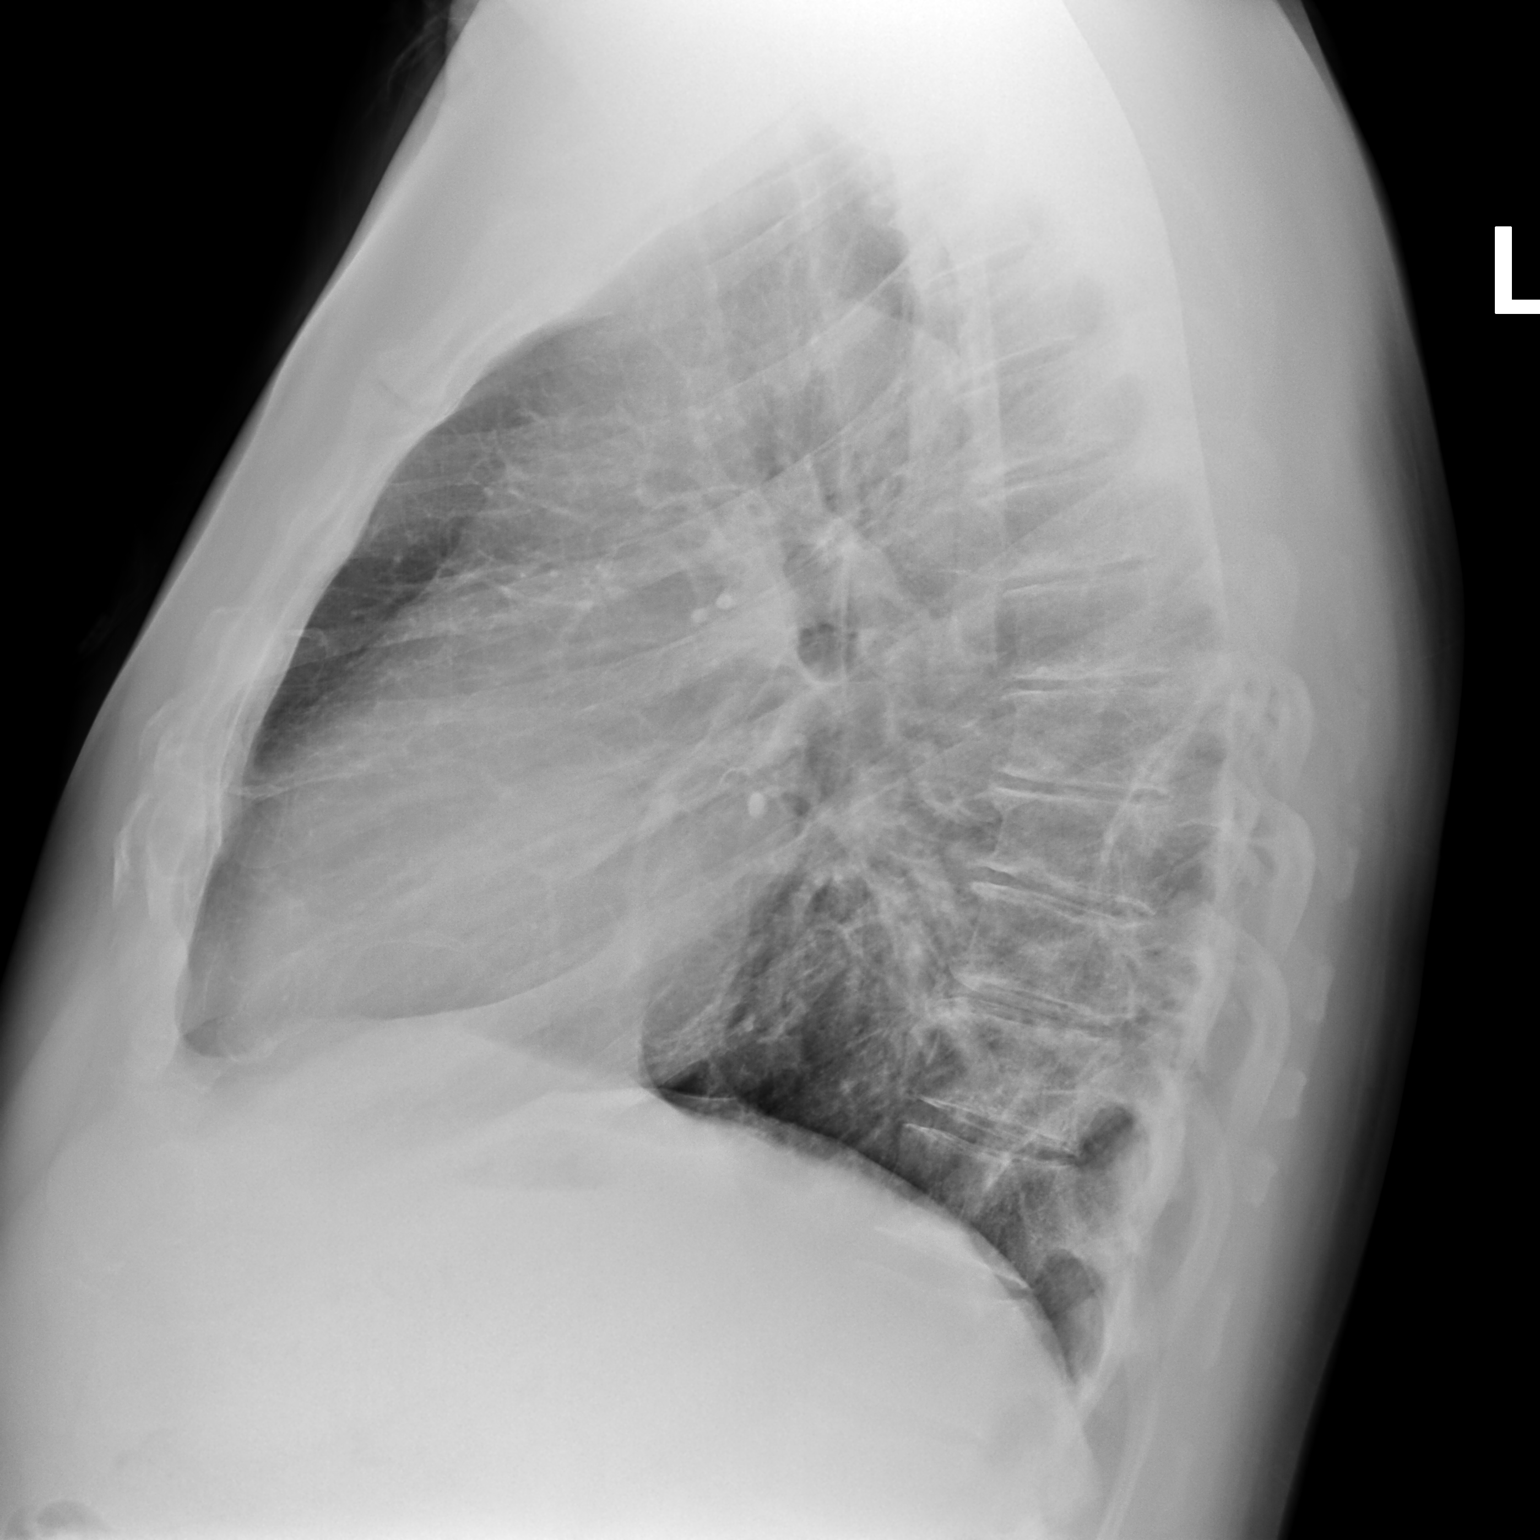

[2 of 2 positions shown; findings below may reference images not displayed]

FINDINGS: The cardiomediastinal silhouette is unchanged in contour. No pleural
effusion. No pneumothorax. Persistent hazy bilateral peripheral
predominant opacities, most conspicuous in the RIGHT lung base.
Visualized abdomen is unremarkable. Multilevel degenerative changes
of the thoracic spine.
IMPRESSION: Persistent hazy bilateral peripheral predominant opacities. This is
consistent with the sequela of A2OUT-R7 infection/ARDS.

## 2021-03-03 ENCOUNTER — Ambulatory Visit (INDEPENDENT_AMBULATORY_CARE_PROVIDER_SITE_OTHER): Payer: Self-pay | Admitting: Internal Medicine

## 2021-03-03 ENCOUNTER — Ambulatory Visit (INDEPENDENT_AMBULATORY_CARE_PROVIDER_SITE_OTHER): Payer: BC Managed Care – PPO

## 2021-03-03 ENCOUNTER — Encounter: Payer: Self-pay | Admitting: Internal Medicine

## 2021-03-03 ENCOUNTER — Other Ambulatory Visit: Payer: Self-pay

## 2021-03-03 VITALS — BP 130/76 | HR 74 | Temp 98.4°F | Ht 79.0 in | Wt 306.6 lb

## 2021-03-03 DIAGNOSIS — M25512 Pain in left shoulder: Secondary | ICD-10-CM

## 2021-03-03 DIAGNOSIS — Z0000 Encounter for general adult medical examination without abnormal findings: Secondary | ICD-10-CM | POA: Diagnosis not present

## 2021-03-03 DIAGNOSIS — R0781 Pleurodynia: Secondary | ICD-10-CM | POA: Diagnosis not present

## 2021-03-03 DIAGNOSIS — R079 Chest pain, unspecified: Secondary | ICD-10-CM | POA: Diagnosis not present

## 2021-03-03 DIAGNOSIS — M19012 Primary osteoarthritis, left shoulder: Secondary | ICD-10-CM

## 2021-03-03 DIAGNOSIS — R9389 Abnormal findings on diagnostic imaging of other specified body structures: Secondary | ICD-10-CM

## 2021-03-03 DIAGNOSIS — M79671 Pain in right foot: Secondary | ICD-10-CM

## 2021-03-03 DIAGNOSIS — K219 Gastro-esophageal reflux disease without esophagitis: Secondary | ICD-10-CM

## 2021-03-03 DIAGNOSIS — M79672 Pain in left foot: Secondary | ICD-10-CM

## 2021-03-03 DIAGNOSIS — R319 Hematuria, unspecified: Secondary | ICD-10-CM | POA: Diagnosis not present

## 2021-03-03 IMAGING — DX DG CERVICAL SPINE 2 OR 3 VIEWS
4 series · 4 of 4 positions shown · non-contrast
Comparison: None.

CLINICAL DATA: Chronic neck stiffness, pain

EXAM:
CERVICAL SPINE - 2-3 VIEW

[c-spine lat]
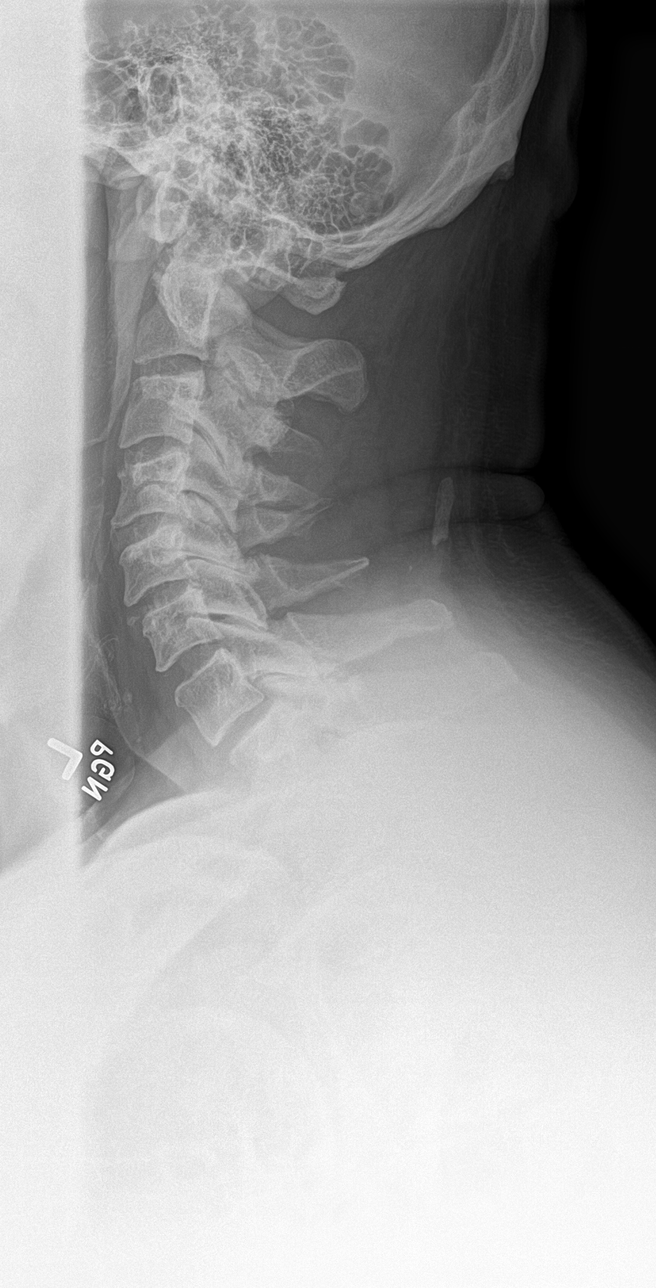

[c-spine ap (1 of 2)]
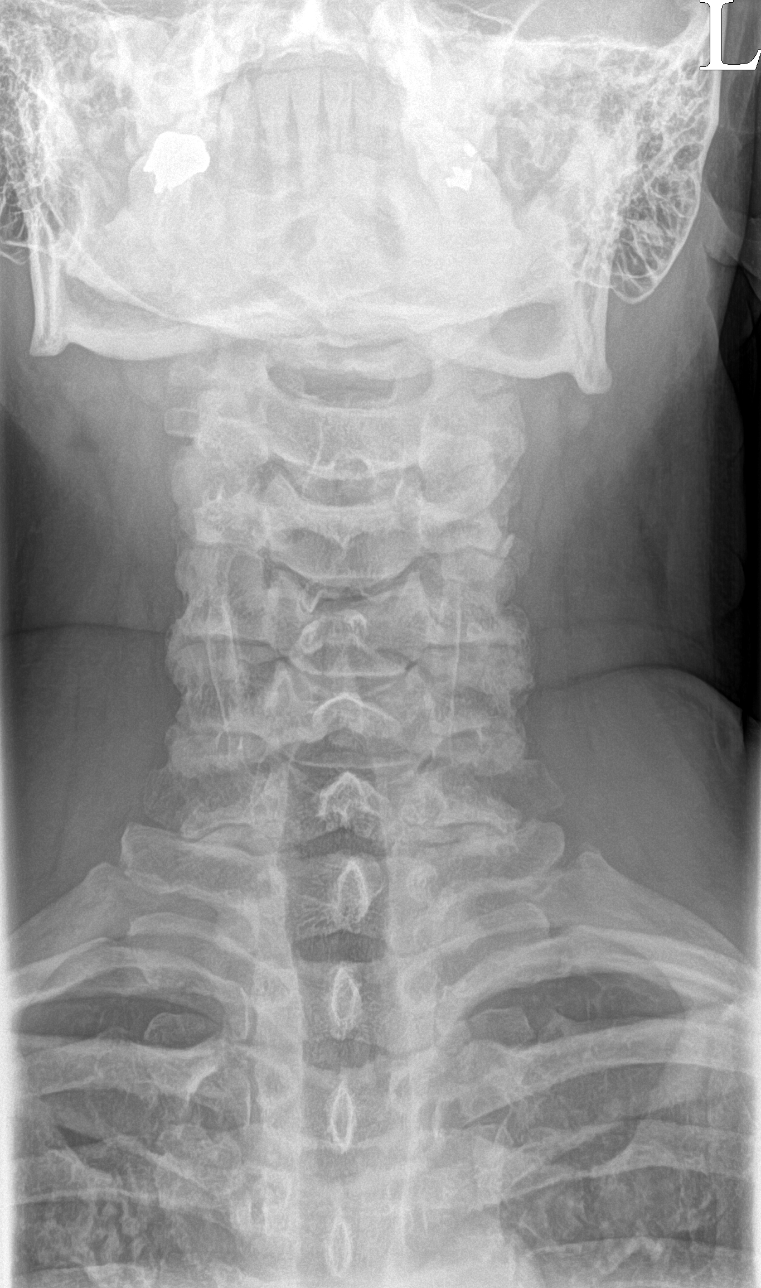

[c-spine open mouth]
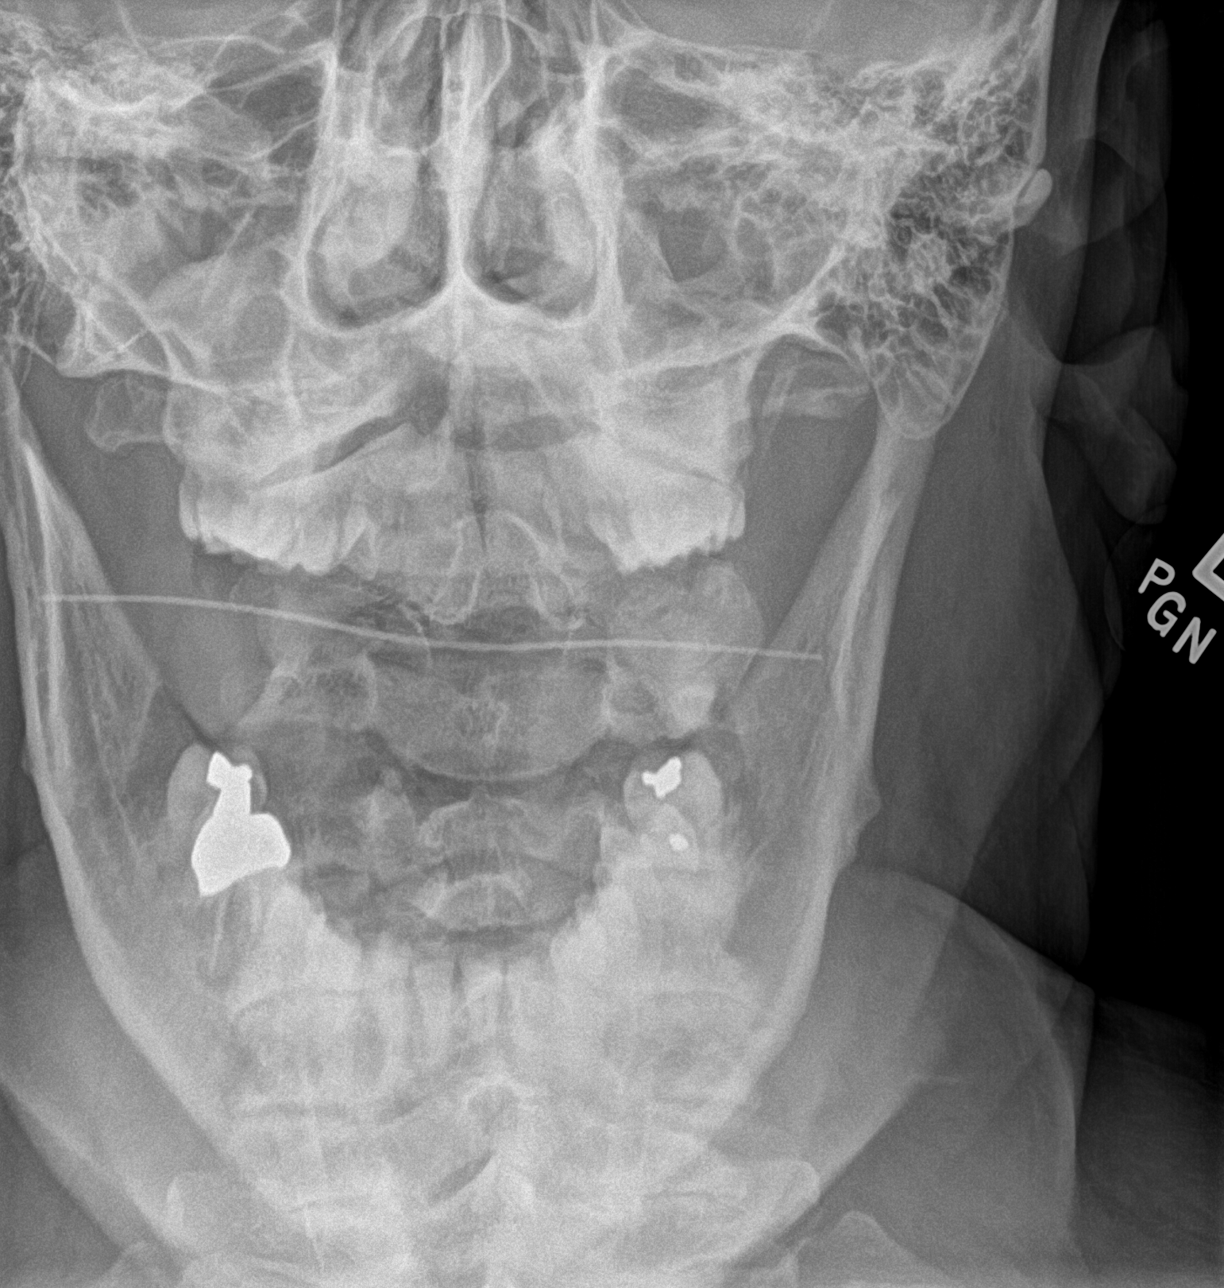

[c-spine ap (2 of 2)]
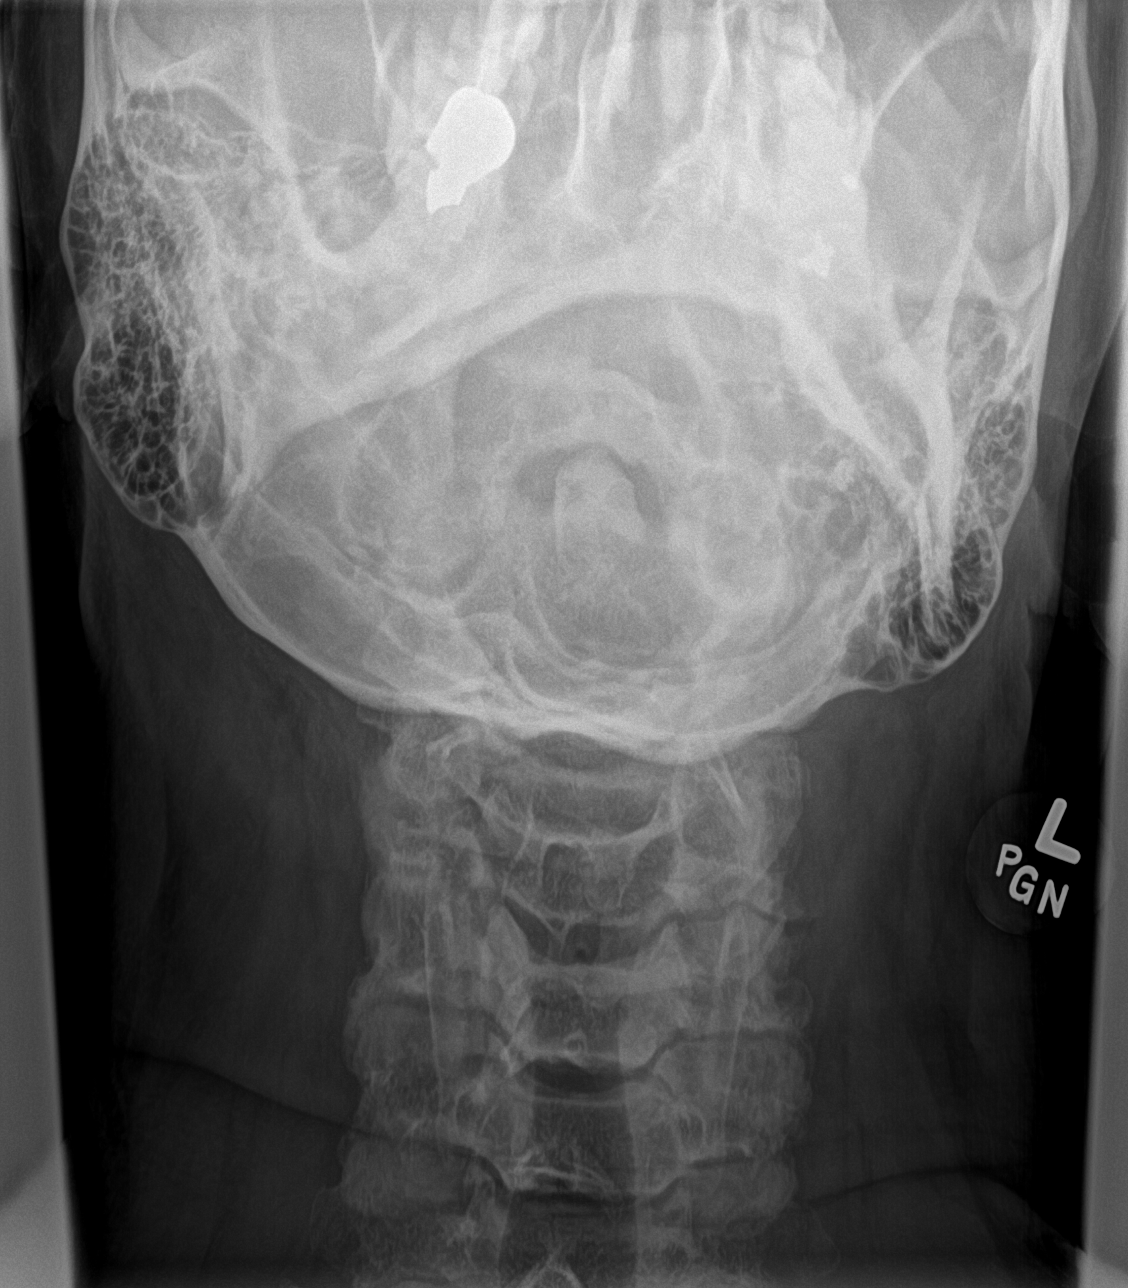

[4 of 4 positions shown; findings below may reference images not displayed]

FINDINGS: Normal alignment. Prevertebral soft tissues are normal. Diffuse
degenerative facet disease. No fracture.
IMPRESSION: Diffuse degenerative facet disease.  No acute bony abnormality.

## 2021-03-03 MED ORDER — PANTOPRAZOLE SODIUM 20 MG PO TBEC: 20.0000 mg | DELAYED_RELEASE_TABLET | ORAL | 3 refills | Status: DC

## 2021-03-03 NOTE — Progress Notes (Addendum)
Chief Complaint  Patient presents with   Follow-up   Annual Exam   Annual  1 reviewed labs + HLD elevated ldl >130 rec healthy diet and exercise  2. Left arm pain and left shoulder pain h/o covid abnormal cxr 01/2020 will f/u  X few weeks 3-6/10 nothing tried he is right handed  3. Vit D def on D3 5000 iu qd    Review of Systems  Constitutional:  Negative for weight loss.  HENT:  Negative for hearing loss.   Eyes:  Negative for blurred vision.  Respiratory:  Negative for shortness of breath.   Cardiovascular:  Negative for chest pain.  Gastrointestinal:  Negative for abdominal pain and blood in stool.  Genitourinary:  Negative for dysuria.  Musculoskeletal:  Negative for back pain, falls and joint pain.  Skin:  Negative for rash.  Neurological:  Negative for headaches.  Psychiatric/Behavioral:  Negative for depression.   Past Medical History:  Diagnosis Date   Bradycardia    Chicken pox    COVID-19    01/07/20 with covid pneumonia [facility] s/p MAB infusion 01/13/20 [facility] ED   GERD (gastroesophageal reflux disease)    Hiatal hernia    small noted 2013    Hyperlipidemia    Kidney stone    Premature atrial contractions    Premature ventricular contraction    Vitamin D deficiency    Wrist fracture    right    Past Surgical History:  Procedure Laterality Date   LASIK     2004    TONSILLECTOMY AND ADENOIDECTOMY  1978   Family History  Problem Relation Age of Onset   Cancer Mother        kidney cancer    Kidney disease Mother    Diabetes Mother    Hearing loss Mother    Heart disease Mother    Cancer Father        prostate, colon    Kidney disease Father    Diabetes Father    Hearing loss Father    Colon cancer Father 31   Colon polyps Father    Stroke Maternal Grandmother    Cancer Maternal Grandfather        bladder cancer, +smoker, +prostate    Alcohol abuse Maternal Grandfather    Colon cancer Maternal Grandfather    Stomach cancer Other     Rectal cancer Other    Testicular cancer Cousin        M cousin   Pancreatic cancer Son        died age 52   Esophageal cancer Neg Hx    Social History   Socioeconomic History   Marital status: Married    Spouse name: Not on file   Number of children: Not on file   Years of education: Not on file   Highest education level: Not on file  Occupational History   Not on file  Tobacco Use   Smoking status: Former    Packs/day: 1.00    Years: 12.00    Pack years: 12.00    Types: Cigarettes    Quit date: 2003    Years since quitting: 20.1   Smokeless tobacco: Former    Types: Chew   Tobacco comments:    Quit 20 years ago.  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    Feels safe in relationship, wears seat belt, owns guns in home    Social Determinants of Health   Financial Resource Strain: Not on file  Food Insecurity: Not on file  Transportation Needs: Not on file  Physical Activity: Not on file  Stress: Not on file  Social Connections: Not on file  Intimate Partner Violence: Not on file   Current Meds  Medication Sig   Cholecalciferol (VITAMIN D3) 1.25 MG (50000 UT) TABS Take 1 tablet by mouth daily.   pantoprazole (PROTONIX) 20 MG tablet Take 1 tablet (20 mg total) by mouth every morning. 30 minutes before food   tadalafil (CIALIS) 5 MG tablet Take 5 mg by mouth daily.   No Known Allergies Recent Results (from the past 2160 hour(s))  Vitamin D (25 hydroxy)     Status: Abnormal   Collection Time: 02/25/21 10:19 AM  Result Value Ref Range   VITD 28.56 (L) 30.00 - 100.00 ng/mL  Comprehensive metabolic panel     Status: None   Collection Time: 02/25/21  10:19 AM  Result Value Ref Range   Sodium 140 135 - 145 mEq/L   Potassium 4.2 3.5 - 5.1 mEq/L   Chloride 104 96 - 112 mEq/L   CO2 28 19 - 32 mEq/L   Glucose, Bld 78 70 - 99 mg/dL   BUN 13 6 - 23 mg/dL   Creatinine, Ser 0.99 0.40 - 1.50 mg/dL   Total Bilirubin 1.0 0.2 - 1.2 mg/dL   Alkaline Phosphatase 55 39 - 117 U/L   AST 31 0 - 37 U/L   ALT 46 0 - 53 U/L   Total Protein 7.1 6.0 - 8.3 g/dL   Albumin 4.8 3.5 - 5.2 g/dL   GFR 88.72 >60.00 mL/min    Comment: Calculated using the CKD-EPI Creatinine Equation (2021)   Calcium 9.7 8.4 - 10.5 mg/dL  PSA     Status: None   Collection Time: 02/25/21 10:19 AM  Result Value Ref Range   PSA 1.24 0.10 - 4.00 ng/mL    Comment: Test performed using Access Hybritech PSA Assay, a parmagnetic partical, chemiluminecent immunoassay.  CBC with Differential/Platelet     Status: None   Collection Time: 02/25/21 10:19 AM  Result Value Ref Range   WBC 5.8 4.0 - 10.5 K/uL   RBC 5.15 4.22 - 5.81 Mil/uL   Hemoglobin 15.3 13.0 - 17.0 g/dL   HCT 45.5 39.0 - 52.0 %   MCV 88.4 78.0 - 100.0 fl   MCHC 33.5 30.0 - 36.0 g/dL   RDW 14.3 11.5 - 15.5 %   Platelets 175.0 150.0 - 400.0 K/uL   Neutrophils Relative % 58.4 43.0 - 77.0 %   Lymphocytes Relative 31.4 12.0 - 46.0 %   Monocytes Relative 8.4 3.0 - 12.0 %   Eosinophils Relative 1.4 0.0 - 5.0 %   Basophils Relative 0.4 0.0 - 3.0 %   Neutro Abs 3.4 1.4 - 7.7 K/uL   Lymphs Abs 1.8 0.7 - 4.0 K/uL   Monocytes Absolute 0.5 0.1 - 1.0 K/uL   Eosinophils Absolute 0.1 0.0 - 0.7 K/uL   Basophils Absolute 0.0 0.0 - 0.1 K/uL  Lipid panel     Status: Abnormal   Collection Time: 02/25/21 10:19 AM  Result Value Ref Range   Cholesterol 198 0 - 200 mg/dL    Comment: ATP III Classification       Desirable:  < 200 mg/dL               Borderline High:  200 - 239 mg/dL          High:  > = 240 mg/dL   Triglycerides 138.0 0.0 - 149.0 mg/dL    Comment: Normal:  <150 mg/dLBorderline High:  150 - 199 mg/dL   HDL 38.40 (L)  >39.00 mg/dL   VLDL 27.6 0.0 - 40.0 mg/dL   LDL Cholesterol 132 (H) 0 - 99 mg/dL   Total CHOL/HDL Ratio 5     Comment:                Men          Women1/2 Average Risk     3.4          3.3Average Risk          5.0          4.42X Average Risk          9.6          7.13X Average Risk          15.0          11.0                       NonHDL 159.55     Comment: NOTE:  Non-HDL goal should be 30 mg/dL higher than patient's LDL goal (i.e. LDL goal of < 70 mg/dL, would have non-HDL goal of < 100 mg/dL)  Urinalysis, Routine w reflex microscopic     Status: Abnormal   Collection Time: 02/25/21 10:19 AM  Result Value Ref Range   Color, Urine YELLOW Yellow;Lt. Yellow;Straw;Dark Yellow;Amber;Green;Red;Brown   APPearance CLEAR Clear;Turbid;Slightly Cloudy;Cloudy   Specific Gravity, Urine 1.025 1.000 - 1.030   pH 5.5 5.0 - 8.0   Total Protein, Urine NEGATIVE Negative   Urine Glucose NEGATIVE Negative   Ketones, ur NEGATIVE Negative   Bilirubin Urine NEGATIVE Negative   Hgb urine dipstick TRACE-LYSED (A) Negative   Urobilinogen, UA 0.2 0.0 - 1.0   Leukocytes,Ua NEGATIVE Negative   Nitrite NEGATIVE Negative   WBC, UA 0-2/hpf 0-2/hpf   RBC / HPF none seen 0-2/hpf   Mucus, UA Presence of (A) None  TSH     Status: None   Collection Time: 02/25/21 10:19 AM  Result Value Ref Range   TSH 3.06 0.35 - 5.50 uIU/mL   Objective  Body mass index is 34.54 kg/m. Wt Readings from Last 3 Encounters:  03/03/21 (!) 306 lb 9.6 oz (139.1 kg)  01/14/21 295 lb (133.8 kg)  08/07/20 299 lb (135.6 kg)   Temp Readings from Last 3 Encounters:  03/03/21 98.4 F (36.9 C) (Oral)  06/18/20 (!) 97.4 F (36.3 C) (Temporal)  04/21/20 (!) 97 F (36.1 C) (Temporal)   BP Readings from Last 3 Encounters:  03/03/21 130/76  08/07/20 (!) 142/84  06/18/20 134/82   Pulse Readings from Last 3 Encounters:  03/03/21 74  08/07/20 (!) 52  06/18/20 83    Physical Exam Vitals and nursing note reviewed.  Constitutional:       Appearance: Normal appearance. He is well-developed and well-groomed.  HENT:     Head: Normocephalic and atraumatic.  Eyes:     Conjunctiva/sclera: Conjunctivae normal.     Pupils: Pupils  are equal, round, and reactive to light.  Cardiovascular:     Rate and Rhythm: Normal rate and regular rhythm.     Heart sounds: Normal heart sounds.  Pulmonary:     Effort: Pulmonary effort is normal. No respiratory distress.     Breath sounds: Normal breath sounds.  Abdominal:     Tenderness: There is no abdominal tenderness.  Skin:    General: Skin is warm and moist.  Neurological:     General: No focal deficit present.     Mental Status: He is alert and oriented to person, place, and time. Mental status is at baseline.     Sensory: Sensation is intact.     Motor: Motor function is intact.     Coordination: Coordination is intact.     Gait: Gait is intact. Gait normal.  Psychiatric:        Attention and Perception: Attention and perception normal.        Mood and Affect: Mood and affect normal.        Speech: Speech normal.        Behavior: Behavior normal. Behavior is cooperative.        Thought Content: Thought content normal.        Cognition and Memory: Cognition and memory normal.        Judgment: Judgment normal.    Assessment  Plan  Annual physical exam See below   Pain in both feet - Plan: Ambulatory referral to Podiatry  Hematuria, unspecified type - Plan: Urinalysis, Routine w reflex microscopic, Urine Culture  Acute pain of left shoulder/left flank pain likely MSK CT ab/pelvis 06/2020 neg - Plan: DG Shoulder Left, FINDINGS: Mild inferior glenoid and humeral head-neck junction degenerative osteophytosis. Mild inferior acromioclavicular joint space narrowing and peripheral osteophytosis. No acute fracture is seen. No dislocation. The visualized portion of the left lung is unremarkable.   IMPRESSION: Mild acromioclavicular and glenohumeral osteoarthritis.      Electronically Signed   By: Yvonne Kendall M.D.   On: 03/05/2021 08:58  Rib pain on left side - Plan: DG Ribs Unilateral Left Abnormal CXR (chest x-ray) - Plan: DG Chest 2 View  -consider ortho in the future   HM Declines flu shot, covid shot, shingrix declines though consider in the future covid 19 +01/07/20 Hep B immune rec mmr vaccine in future Tdap had 06/05/16  Had 2/3 hep B vaccines immune as  Of 06/21/2019 titer >1000 protected  FH prostate cancer in dad last PSA 2.89 02/07/20<0.81  06/21/19 PSA 06/25/20 0.91 returned to baseline  est.  [facility] Dr. Gloriann Loan f/u 08/2020   PSA 1.24 02/25/21   Testosterone normal 11/02/17     Colonoscopy 10/25/19 polyps sessile hyperplastic f/u 5 years 10/24/24   Former smoker quit 15-20 years ago smoker x 10 years 1/2 ppd max 1 ppd no FH lung cancer Former chew quit as of 06/25/19  -Congratulated on quitting both  rec healthy diet and exercise   11/14/19 testicle US   FINDINGS: Right testicle   Measurements: 5.1 x 2.7 x 3.8 cm. No mass or microlithiasis visualized.   Left testicle   Measurements: 4.6 x 2.9 x 3.3 cm. No mass or microlithiasis visualized.   Right epididymis:  Scattered small epididymal cysts are noted.   Left epididymis:  Normal in size and appearance.   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   Varicocele:  None visualized.   Pulsed Doppler interrogation of both testes demonstrates normal low resistance arterial and  venous waveforms bilaterally.   IMPRESSION: Bilateral varicoceles.   Small right epididymal cyst.   Normal-appearing testicles. Electronically Signed   By: Inez Catalina M.D.   On: 11/14/2019 16:52  Provider: Dr. Olivia Mackie [facility]

## 2021-03-03 NOTE — Patient Instructions (Signed)
Foot Pain °Many things can cause foot pain. Some common causes are: °An injury. °A sprain. °Arthritis. °Blisters. °Bunions. °Follow these instructions at home: °Managing pain, stiffness, and swelling °If directed, put ice on the painful area: °Put ice in a plastic bag. °Place a towel between your skin and the bag. °Leave the ice on for 20 minutes, 2-3 times a day. ° °Activity °Do not stand or walk for long periods. °Return to your normal activities as told by your health care provider. Ask your health care provider what activities are safe for you. °Do stretches to relieve foot pain and stiffness as told by your health care provider. °Do not lift anything that is heavier than 10 lb (4.5 kg), or the limit that you are told, until your health care provider says that it is safe. Lifting a lot of weight can put added pressure on your feet. °Lifestyle °Wear comfortable, supportive shoes that fit you well. Do not wear high heels. °Keep your feet clean and dry. °General instructions °Take over-the-counter and prescription medicines only as told by your health care provider. °Rub your foot gently. °Pay attention to any changes in your symptoms. °Keep all follow-up visits as told by your health care provider. This is important. °Contact a health care provider if: °Your pain does not get better after a few days of self-care. °Your pain gets worse. °You cannot stand on your foot. °Get help right away if: °Your foot is numb or tingling. °Your foot or toes are swollen. °Your foot or toes turn white or blue. °You have warmth and redness along your foot. °Summary °Common causes of foot pain are injury, sprain, arthritis, blisters, or bunions. °Ice, medicines, and comfortable shoes may help foot pain. °Contact your health care provider if your pain does not get better after a few days of self-care. °This information is not intended to replace advice given to you by your health care provider. Make sure you discuss any questions you  have with your health care provider. °Document Revised: 04/15/2020 Document Reviewed: 04/15/2020 °Elsevier Patient Education © 2022 [employer] Inc. ° °

## 2021-03-04 LAB — URINALYSIS, ROUTINE W REFLEX MICROSCOPIC
Bilirubin Urine: NEGATIVE
Glucose, UA: NEGATIVE
Hgb urine dipstick: NEGATIVE
Ketones, ur: NEGATIVE
Leukocytes,Ua: NEGATIVE
Nitrite: NEGATIVE
Protein, ur: NEGATIVE
Specific Gravity, Urine: 1.022 (ref 1.001–1.035)
pH: 5.5 (ref 5.0–8.0)

## 2021-03-04 LAB — URINE CULTURE
MICRO NUMBER:: 12981199
Result:: NO GROWTH
SPECIMEN QUALITY:: ADEQUATE

## 2021-03-05 DIAGNOSIS — M19012 Primary osteoarthritis, left shoulder: Secondary | ICD-10-CM | POA: Insufficient documentation

## 2021-03-05 NOTE — Addendum Note (Signed)
Addended by: Orland Mustard on: 03/05/2021 04:01 PM   Modules accepted: Orders

## 2021-03-29 ENCOUNTER — Ambulatory Visit (INDEPENDENT_AMBULATORY_CARE_PROVIDER_SITE_OTHER): Payer: 59 | Admitting: Podiatry

## 2021-03-29 ENCOUNTER — Ambulatory Visit (INDEPENDENT_AMBULATORY_CARE_PROVIDER_SITE_OTHER): Payer: 59

## 2021-03-29 ENCOUNTER — Other Ambulatory Visit: Payer: Self-pay | Admitting: Podiatry

## 2021-03-29 ENCOUNTER — Other Ambulatory Visit: Payer: Self-pay

## 2021-03-29 ENCOUNTER — Encounter: Payer: Self-pay | Admitting: Podiatry

## 2021-03-29 DIAGNOSIS — M7742 Metatarsalgia, left foot: Secondary | ICD-10-CM

## 2021-03-29 DIAGNOSIS — M21862 Other specified acquired deformities of left lower leg: Secondary | ICD-10-CM | POA: Diagnosis not present

## 2021-03-29 DIAGNOSIS — M7741 Metatarsalgia, right foot: Secondary | ICD-10-CM

## 2021-03-29 DIAGNOSIS — M21861 Other specified acquired deformities of right lower leg: Secondary | ICD-10-CM | POA: Diagnosis not present

## 2021-03-29 DIAGNOSIS — M722 Plantar fascial fibromatosis: Secondary | ICD-10-CM

## 2021-03-29 DIAGNOSIS — M778 Other enthesopathies, not elsewhere classified: Secondary | ICD-10-CM

## 2021-03-29 NOTE — Progress Notes (Addendum)
?  Subjective:  ?Patient ID: Roger [name], male    DOB: 1970-03-02,  MRN: [id number] ? ?Chief Complaint  ?Patient presents with  ? Foot Pain  ?  Patient presents today for bilat forefoot pain x 2-3 years.  He says "it feels like a flat pebble when I step down on my foot"  He says it sometimes tingles and feels numb and prickly across top of foot.  Right is worse than left  ? ? ?51 y.o. male presents with the above complaint. History confirmed with patient.  ?Objective:  ?Physical Exam: ?warm, good capillary refill, no trophic changes or ulcerative lesions, normal DP and PT pulses, normal sensory exam, and diffuse pain across the ball of the forefoot none is pin point no evidence clinically of a neuroma in any single interspace.  He has significant gastrocnemius equinus with a positive Silfverskiold test. ? ?Radiographs: ?Multiple views x-ray of both feet: no fracture, dislocation, swelling or degenerative changes noted ?Assessment:  ? ?1. Metatarsalgia of both feet   ?2. Gastrocnemius equinus of left lower extremity   ?3. Gastrocnemius equinus of right lower extremity   ? ? ? ?Plan:  ?Patient was evaluated and treated and all questions answered. ? ?Metatarsalgia ?Discussed with him the symptoms of metatarsalgia and how his equinus deformity contributes to this.  I recommended stretching exercises for the equinus deformity.  I discussed that there is a way to surgically lengthen the calf but this is reserved as a last resort option.  I think he would benefit from a custom molded insole to offload the forefoot.  He will be scheduled with Aaron Edelman our orthotist for fitting for this.  No pinpoint lesion or area today localized well enough to warrant a steroid injection but we discussed the option of this in the future if needed.  He will return to see me as needed if he has further issues with that after getting his orthotics .  I also recommended a night splint to treat his gastrocnemius equinus.  This was dispensed today  as well. ? ?Return if symptoms worsen or fail to improve.  ? ?

## 2021-03-29 NOTE — Patient Instructions (Signed)
Do exercises exactly as told by your health care provider and adjust them as directed. It is normal to feel mild stretching, pulling, tightness, or discomfort as you do these exercises. Stop the exercise right away if you feel sudden pain or your pain gets worse.   Stretching exercises These exercises improve the movement and flexibility of your calf muscles. These exercises may also help to relieve pain and stiffness. Standing gastroc stretch  This exercise is also called a standing calf (gastroc) stretch. Stand with your hands against a wall. Extend your left / right leg behind you, and bend your front knee slightly. Your heels should be on the floor. Keeping your heels on the floor and your back knee straight, shift your weight toward the wall. You should feel a gentle stretch in the back of your lower leg (calf). Hold this position for 10 seconds. Repeat 10 times. Complete this exercise 2 times a day. Gastroc and soleus stretch, standing This is an exercise in which you stand on a step and use your body weight to stretch your calf muscles. To do this exercise: Stand with the ball of your left / right foot on a step. The ball of your foot is on the walking surface, right under your toes. Keep your other foot firmly on the same step. Hold on to the wall, a railing, or a chair for balance. Slowly lift your other foot, allowing your body weight to press your left / right heel down over the edge of the step. You should feel a stretch in your left / right calf. Hold this position for 10 seconds. Return both feet to the step. Repeat this exercise with a slight bend in your left / right knee. Repeat 10 times. Complete this exercise 2 times a day. Strengthening exercise This exercise builds strength and endurance in your foot muscles and may help to take pressure off your heel. Endurance is the ability to use your muscles for a long time, even after they get tired. Arch lifts This exercise is  sometimes called foot intrinsics. This is an exercise in which you lift the arch part of your foot only. To do this exercise: Sit in a chair with your feet flat on the floor. Keeping your big toe and your heel on the floor, lift only your arch, which is on the inner edge of your left / right foot. Do not move your knee or scrunch your toes. This is a small movement. Hold this position for 10 seconds. Return to the starting position. Repeat 10 times. Complete this exercise 2 times a day.  

## 2021-04-02 ENCOUNTER — Other Ambulatory Visit: Payer: Self-pay

## 2021-04-02 ENCOUNTER — Ambulatory Visit: Payer: 59

## 2021-04-02 DIAGNOSIS — M7742 Metatarsalgia, left foot: Secondary | ICD-10-CM

## 2021-04-02 DIAGNOSIS — M7741 Metatarsalgia, right foot: Secondary | ICD-10-CM

## 2021-04-02 NOTE — Progress Notes (Signed)
Patient seen for evaluation for custom foot orthotics. Discussed financial responsibility and patient requested a precertification be submitted. Precertification to be sent to insurance carrier and patient to be informed of out of pocket cost estimation. All questions answered and concerns addressed. ?

## 2021-08-18 ENCOUNTER — Ambulatory Visit: Payer: Self-pay | Admitting: Family Medicine

## 2021-08-19 NOTE — Progress Notes (Signed)
[facility] [place] Phone: [phone] Subjective:   Roger [name], am serving as a scribe for Dr. Hulan Saas.  I'm seeing this patient by the request  of:  Roger [name], Roger Glow, MD  CC: Neck pain follow-up  [id number]  Roger [name] is a 51 y.o. male coming in with complaint of neck pain. Last seen for OMT in July 2022. Patient states that his neck has been hurting for past 2 months. Has some tingling in forearm and pinky especially when he holding his phone. Pain is intermittent and he has not tried anything to alleviate his pain.       Past Medical History:  Diagnosis Date   Bradycardia    Chicken pox    COVID-19    01/07/20 with covid pneumonia [facility] s/p MAB infusion 01/13/20 [facility] ED   GERD (gastroesophageal reflux disease)    Hiatal hernia    small noted 2013    Hyperlipidemia    Kidney stone    Premature atrial contractions    Premature ventricular contraction    Vitamin D deficiency    Wrist fracture    right    Past Surgical History:  Procedure Laterality Date   LASIK     2004    TONSILLECTOMY AND ADENOIDECTOMY  1978   Social History   Socioeconomic History   Marital status: Married    Spouse name: Not on file   Number of children: Not on file   Years of education: Not on file   Highest education level: Not on file  Occupational History   Not on file  Tobacco Use   Smoking status: Former    Packs/day: 1.00    Years: 12.00    Total pack years: 12.00    Types: Cigarettes    Quit date: 2003    Years since quitting: 20.5   Smokeless tobacco: Former    Types: Chew   Tobacco comments:    Quit 20 years ago.  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    Feels safe in relationship, wears seat belt, owns guns in home    Social Determinants of Health   Financial Resource Strain: Not on file  Food Insecurity: Not on file  Transportation Needs: Not on file  Physical Activity: Not on file  Stress: Not on file  Social Connections: Not on file   [name] Known Allergies Family History  Problem Relation Age of Onset   Cancer Mother        kidney cancer    Kidney disease Mother    Diabetes Mother    Hearing loss Mother    Heart disease Mother    Cancer Father        prostate, colon    Kidney disease Father    Diabetes Father    Hearing loss Father    Colon cancer Father 66   Colon polyps Father    Stroke Maternal Grandmother    Cancer Maternal Grandfather        bladder cancer, +smoker, +prostate  Alcohol abuse Maternal Grandfather    Colon cancer Maternal Grandfather    Stomach cancer Other    Rectal cancer Other    Testicular cancer Cousin        M cousin   Pancreatic cancer Son        died age 29   Esophageal cancer Neg Hx      Current Outpatient Medications (Cardiovascular):    tadalafil (CIALIS) 5 MG tablet, Take 5 mg by mouth daily.     Current Outpatient Medications (Other):    Cholecalciferol (VITAMIN D3) 1.25 MG (50000 UT) TABS, Take 1 tablet by mouth daily.   pantoprazole (PROTONIX) 20 MG tablet, Take 1 tablet (20 mg total) by mouth every morning. 30 minutes before food   Reviewed prior external information including notes and imaging from  primary care provider As well as notes that were available from care everywhere and other healthcare systems.  Past medical history, social, surgical and family history all reviewed in electronic medical record.  [name] pertanent information unless stated regarding to the chief complaint.   Review of Systems:  [name] headache, visual changes, nausea, vomiting, diarrhea,  constipation, dizziness, abdominal pain, skin rash, fevers, chills, night sweats, weight loss, swollen lymph nodes, body aches, joint swelling, chest pain, shortness of breath, mood changes. POSITIVE muscle aches  Objective  Blood pressure 112/78, pulse (!) 52, height '6\' 7"'$  (2.007 m), weight (!) 304 lb (137.9 kg), SpO2 97 %.   General: [name] apparent distress alert and oriented x3 mood and affect normal, dressed appropriately.  HEENT: Pupils equal, extraocular movements intact  Respiratory: Patient's speak in full sentences and does not appear short of breath  Cardiovascular: [name] lower extremity edema, non tender, [name] erythema   Neck exam does have some loss of lordosis.  Some tenderness to palpation.  Lacks last 5 degrees of extension.  Strength of the upper extremities.  Osteopathic findings C2 flexed rotated and side bent right C4 flexed rotated and side bent left C6 flexed rotated and side bent left T3 extended rotated and side bent left inhaled third rib T9 extended rotated and side bent left L2 flexed rotated and side bent right Sacrum right on right   [id number]; 15 additional minutes spent for Therapeutic exercises as stated in above notes.  This included exercises focusing on stretching, strengthening, with significant focus on eccentric aspects.   Long term goals include an improvement in range of motion, strength, endurance as well as avoiding reinjury. Patient's frequency would include in 1-2 times a day, 3-5 times a week for a duration of 6-12 weeks. Shoulder Exercises that included:  Basic scapular stabilization to include adduction and depression of scapula Scaption, focusing on proper movement and good control Internal and External rotation utilizing a theraband, with elbow tucked at side entire time Rows with theraband   Proper technique shown and discussed handout in great detail with ATC.  All questions were discussed and answered.     Impression and Recommendations:

## 2021-08-23 ENCOUNTER — Ambulatory Visit (INDEPENDENT_AMBULATORY_CARE_PROVIDER_SITE_OTHER): Payer: 59 | Admitting: Family Medicine

## 2021-08-23 VITALS — BP 112/78 | HR 52 | Ht 79.0 in | Wt 304.0 lb

## 2021-08-23 DIAGNOSIS — M542 Cervicalgia: Secondary | ICD-10-CM

## 2021-08-23 DIAGNOSIS — M9908 Segmental and somatic dysfunction of rib cage: Secondary | ICD-10-CM | POA: Diagnosis not present

## 2021-08-23 DIAGNOSIS — M9902 Segmental and somatic dysfunction of thoracic region: Secondary | ICD-10-CM

## 2021-08-23 DIAGNOSIS — M9904 Segmental and somatic dysfunction of sacral region: Secondary | ICD-10-CM

## 2021-08-23 DIAGNOSIS — M9903 Segmental and somatic dysfunction of lumbar region: Secondary | ICD-10-CM | POA: Diagnosis not present

## 2021-08-23 DIAGNOSIS — M5030 Other cervical disc degeneration, unspecified cervical region: Secondary | ICD-10-CM | POA: Diagnosis not present

## 2021-08-23 DIAGNOSIS — M545 Low back pain, unspecified: Secondary | ICD-10-CM

## 2021-08-23 DIAGNOSIS — G8929 Other chronic pain: Secondary | ICD-10-CM

## 2021-08-23 DIAGNOSIS — E669 Obesity, unspecified: Secondary | ICD-10-CM

## 2021-08-23 DIAGNOSIS — M9901 Segmental and somatic dysfunction of cervical region: Secondary | ICD-10-CM | POA: Diagnosis not present

## 2021-08-23 DIAGNOSIS — M999 Biomechanical lesion, unspecified: Secondary | ICD-10-CM

## 2021-08-23 NOTE — Assessment & Plan Note (Signed)
Known mild arthritic changes.  We will get x-rays to further evaluate.  Discussed posture and ergonomics.  Discussed with patient to monitor radicular symptoms.  Has stated that he has had them from time to time.  Follow-up with me again in 6 to 8 weeks

## 2021-08-23 NOTE — Patient Instructions (Addendum)
Healthy Weight- Roger [name]  PF exercises [email or web address] HOKA recovery sandals See me in 6- 8 weeks

## 2021-08-23 NOTE — Assessment & Plan Note (Signed)

## 2021-08-23 NOTE — Assessment & Plan Note (Signed)
Low back pain this likely contributed to some of his weight gain.  Encourage patient to continue to work on weight loss.  We discussed different diet changes.  Will refer to ambulatory medical weight management.  Follow-up with me again in 6 weeks

## 2021-09-09 ENCOUNTER — Ambulatory Visit: Payer: BC Managed Care – PPO | Admitting: Dermatology

## 2021-09-16 ENCOUNTER — Ambulatory Visit: Payer: BC Managed Care – PPO | Admitting: Dermatology

## 2021-09-30 ENCOUNTER — Ambulatory Visit (INDEPENDENT_AMBULATORY_CARE_PROVIDER_SITE_OTHER): Payer: 59 | Admitting: Dermatology

## 2021-09-30 DIAGNOSIS — L821 Other seborrheic keratosis: Secondary | ICD-10-CM | POA: Diagnosis not present

## 2021-09-30 DIAGNOSIS — D1801 Hemangioma of skin and subcutaneous tissue: Secondary | ICD-10-CM

## 2021-09-30 DIAGNOSIS — L578 Other skin changes due to chronic exposure to nonionizing radiation: Secondary | ICD-10-CM | POA: Diagnosis not present

## 2021-09-30 DIAGNOSIS — L813 Cafe au lait spots: Secondary | ICD-10-CM

## 2021-09-30 DIAGNOSIS — L814 Other melanin hyperpigmentation: Secondary | ICD-10-CM

## 2021-09-30 DIAGNOSIS — Z1283 Encounter for screening for malignant neoplasm of skin: Secondary | ICD-10-CM

## 2021-09-30 DIAGNOSIS — D229 Melanocytic nevi, unspecified: Secondary | ICD-10-CM

## 2021-09-30 DIAGNOSIS — D485 Neoplasm of uncertain behavior of skin: Secondary | ICD-10-CM

## 2021-09-30 NOTE — Patient Instructions (Addendum)
Wound Care Instructions  Cleanse wound gently with soap and water once a day then pat dry with clean gauze. Apply a thin coat of Petrolatum (petroleum jelly, "Vaseline") over the wound (unless you have an allergy to this). We recommend that you use a new, sterile tube of Vaseline. Do not pick or remove scabs. Do not remove the yellow or white "healing tissue" from the base of the wound.  Cover the wound with fresh, clean, nonstick gauze and secure with paper tape. You may use Band-Aids in place of gauze and tape if the wound is small enough, but would recommend trimming much of the tape off as there is often too much. Sometimes Band-Aids can irritate the skin.  You should call the office for your biopsy report after 1 week if you have not already been contacted.  If you experience any problems, such as abnormal amounts of bleeding, swelling, significant bruising, significant pain, or evidence of infection, please call the office immediately.  FOR ADULT SURGERY PATIENTS: If you need something for pain relief you may take 1 extra strength Tylenol (acetaminophen) AND 2 Ibuprofen (200mg each) together every 4 hours as needed for pain. (do not take these if you are allergic to them or if you have a reason you should not take them.) Typically, you may only need pain medication for 1 to 3 days.     Due to recent changes in healthcare laws, you may see results of your pathology and/or laboratory studies on MyChart before the doctors have had a chance to review them. We understand that in some cases there may be results that are confusing or concerning to you. Please understand that not all results are received at the same time and often the doctors may need to interpret multiple results in order to provide you with the best plan of care or course of treatment. Therefore, we ask that you please give us 2 business days to thoroughly review all your results before contacting the office for clarification. Should  we see a critical lab result, you will be contacted sooner.   If You Need Anything After Your Visit  If you have any questions or concerns for your doctor, please call our main line at [phone] and press option 4 to reach your doctor's medical assistant. If no one answers, please leave a voicemail as directed and we will return your call as soon as possible. Messages left after 4 pm will be answered the following business day.   You may also send us a message via MyChart. We typically respond to MyChart messages within 1-2 business days.  For prescription refills, please ask your pharmacy to contact our office. Our fax number is [phone].  If you have an urgent issue when the clinic is closed that cannot wait until the next business day, you can page your doctor at the number below.    Please note that while we do our best to be available for urgent issues outside of office hours, we are not available 24/7.   If you have an urgent issue and are unable to reach us, you may choose to seek medical care at your doctor's office, retail clinic, urgent care center, or emergency room.  If you have a medical emergency, please immediately call 911 or go to the emergency department.  Pager Numbers  - Dr. Kowalski: [phone]  - Dr. Moye: [phone]  - Dr. Stewart: [phone]  In the event of inclement weather, please call our main line at   [phone] for an update on the status of any delays or closures.  Dermatology Medication Tips: Please keep the boxes that topical medications come in in order to help keep track of the instructions about where and how to use these. Pharmacies typically print the medication instructions only on the boxes and not directly on the medication tubes.   If your medication is too expensive, please contact our office at [phone] option 4 or send us a message through MyChart.   We are unable to tell what your co-pay for medications will be in  advance as this is different depending on your insurance coverage. However, we may be able to find a substitute medication at lower cost or fill out paperwork to get insurance to cover a needed medication.   If a prior authorization is required to get your medication covered by your insurance company, please allow us 1-2 business days to complete this process.  Drug prices often vary depending on where the prescription is filled and some pharmacies may offer cheaper prices.  The website www.goodrx.com contains coupons for medications through different pharmacies. The prices here do not account for what the cost may be with help from insurance (it may be cheaper with your insurance), but the website can give you the price if you did not use any insurance.  - You can print the associated coupon and take it with your prescription to the pharmacy.  - You may also stop by our office during regular business hours and pick up a GoodRx coupon card.  - If you need your prescription sent electronically to a different pharmacy, notify our office through [facility] MyChart or by phone at [phone] option 4.     Si Usted Necesita Algo Despus de Su Visita  Tambin puede enviarnos un mensaje a travs de MyChart. Por lo general respondemos a los mensajes de MyChart en el transcurso de 1 a 2 das hbiles.  Para renovar recetas, por favor pida a su farmacia que se ponga en contacto con nuestra oficina. Nuestro nmero de fax es el [phone].  Si tiene un asunto urgente cuando la clnica est cerrada y que no puede esperar hasta el siguiente da hbil, puede llamar/localizar a su doctor(a) al nmero que aparece a continuacin.   Por favor, tenga en cuenta que aunque hacemos todo lo posible para estar disponibles para asuntos urgentes fuera del horario de oficina, no estamos disponibles las 24 horas del da, los 7 das de la semana.   Si tiene un problema urgente y no puede comunicarse con nosotros, puede  optar por buscar atencin mdica  en el consultorio de su doctor(a), en una clnica privada, en un centro de atencin urgente o en una sala de emergencias.  Si tiene una emergencia mdica, por favor llame inmediatamente al 911 o vaya a la sala de emergencias.  Nmeros de bper  - Dr. Kowalski: [phone]  - Dra. Moye: [phone]  - Dra. Stewart: [phone]  En caso de inclemencias del tiempo, por favor llame a nuestra lnea principal al [phone] para una actualizacin sobre el estado de cualquier retraso o cierre.  Consejos para la medicacin en dermatologa: Por favor, guarde las cajas en las que vienen los medicamentos de uso tpico para ayudarle a seguir las instrucciones sobre dnde y cmo usarlos. Las farmacias generalmente imprimen las instrucciones del medicamento slo en las cajas y no directamente en los tubos del medicamento.   Si su medicamento es muy caro, por favor, pngase en contacto con   nuestra oficina llamando al [phone] y presione la opcin 4 o envenos un mensaje a travs de MyChart.   No podemos decirle cul ser su copago por los medicamentos por adelantado ya que esto es diferente dependiendo de la cobertura de su seguro. Sin embargo, es posible que podamos encontrar un medicamento sustituto a menor costo o llenar un formulario para que el seguro cubra el medicamento que se considera necesario.   Si se requiere una autorizacin previa para que su compaa de seguros cubra su medicamento, por favor permtanos de 1 a 2 das hbiles para completar este proceso.  Los precios de los medicamentos varan con frecuencia dependiendo del lugar de dnde se surte la receta y alguna farmacias pueden ofrecer precios ms baratos.  El sitio web www.goodrx.com tiene cupones para medicamentos de diferentes farmacias. Los precios aqu no tienen en cuenta lo que podra costar con la ayuda del seguro (puede ser ms barato con su seguro), pero el sitio web puede darle el  precio si no utiliz ningn seguro.  - Puede imprimir el cupn correspondiente y llevarlo con su receta a la farmacia.  - Tambin puede pasar por nuestra oficina durante el horario de atencin regular y recoger una tarjeta de cupones de GoodRx.  - Si necesita que su receta se enve electrnicamente a una farmacia diferente, informe a nuestra oficina a travs de MyChart de [facility] o por telfono llamando al [phone] y presione la opcin 4.  

## 2021-09-30 NOTE — Progress Notes (Unsigned)
Follow-Up Visit   Subjective  Roger [name] is a 51 y.o. male who presents for the following: Annual Exam (Lesion on the L side that is changing in appearance and itching). The patient presents for Total-Body Skin Exam (TBSE) for skin cancer screening and mole check.  The patient has spots, moles and lesions to be evaluated, some may be new or changing and the patient has concerns that these could be cancer.   The following portions of the chart were reviewed this encounter and updated as appropriate:   Tobacco  Allergies  Meds  Problems  Med Hx  Surg Hx  Fam Hx     Review of Systems:  No other skin or systemic complaints except as noted in HPI or Assessment and Plan.  Objective  Well appearing patient in no apparent distress; mood and affect are within normal limits.  A full examination was performed including scalp, head, eyes, ears, nose, lips, neck, chest, axillae, abdomen, back, buttocks, bilateral upper extremities, bilateral lower extremities, hands, feet, fingers, toes, fingernails, and toenails. All findings within normal limits unless otherwise noted below.  R scalp/temple 0.5 cm red papule.   L mid side 0.7 cm flesh colored papule.   R sternum 0.5 cm red papule.  R scalp supra auricular 0.6 cm red papule.     Assessment & Plan  Neoplasm of uncertain behavior of skin (4) R scalp/temple Epidermal / dermal shaving  Lesion diameter (cm):  0.5 Informed consent: discussed and consent obtained   Timeout: patient name, date of birth, surgical site, and procedure verified   Procedure prep:  Patient was prepped and draped in usual sterile fashion Prep type:  Isopropyl alcohol Anesthesia: the lesion was anesthetized in a standard fashion   Anesthetic:  1% lidocaine w/ epinephrine 1-100,000 buffered w/ 8.4% NaHCO3 Instrument used: flexible razor blade   Hemostasis achieved with: pressure, aluminum chloride and electrodesiccation   Outcome: patient tolerated  procedure well   Post-procedure details: sterile dressing applied and wound care instructions given   Dressing type: bandage and petrolatum    Specimen 1 - Surgical pathology Differential Diagnosis: D48.5 r/o irritated hemangioma vs other Check Margins: No  L mid side Epidermal / dermal shaving  Lesion diameter (cm):  0.7 Informed consent: discussed and consent obtained   Timeout: patient name, date of birth, surgical site, and procedure verified   Procedure prep:  Patient was prepped and draped in usual sterile fashion Prep type:  Isopropyl alcohol Anesthesia: the lesion was anesthetized in a standard fashion   Anesthetic:  1% lidocaine w/ epinephrine 1-100,000 buffered w/ 8.4% NaHCO3 Instrument used: flexible razor blade   Hemostasis achieved with: pressure, aluminum chloride and electrodesiccation   Outcome: patient tolerated procedure well   Post-procedure details: sterile dressing applied and wound care instructions given   Dressing type: bandage and petrolatum    Specimen 2 - Surgical pathology Differential Diagnosis: D48.5 irritated nevus r/o dysplasia  Check Margins: No  R sternum Epidermal / dermal shaving  Lesion diameter (cm):  0.5 Informed consent: discussed and consent obtained   Timeout: patient name, date of birth, surgical site, and procedure verified   Procedure prep:  Patient was prepped and draped in usual sterile fashion Prep type:  Isopropyl alcohol Anesthesia: the lesion was anesthetized in a standard fashion   Anesthetic:  1% lidocaine w/ epinephrine 1-100,000 buffered w/ 8.4% NaHCO3 Instrument used: flexible razor blade   Hemostasis achieved with: pressure, aluminum chloride and electrodesiccation   Outcome: patient tolerated  procedure well   Post-procedure details: sterile dressing applied and wound care instructions given   Dressing type: bandage and petrolatum    Specimen 3 - Surgical pathology Differential Diagnosis: D48.5 r/o irritated  hemangioma vs other Check Margins: No  R scalp supra auricular Epidermal / dermal shaving  Lesion diameter (cm):  0.6 Informed consent: discussed and consent obtained   Timeout: patient name, date of birth, surgical site, and procedure verified   Procedure prep:  Patient was prepped and draped in usual sterile fashion Prep type:  Isopropyl alcohol Anesthesia: the lesion was anesthetized in a standard fashion   Anesthetic:  1% lidocaine w/ epinephrine 1-100,000 buffered w/ 8.4% NaHCO3 Instrument used: flexible razor blade   Hemostasis achieved with: pressure, aluminum chloride and electrodesiccation   Outcome: patient tolerated procedure well   Post-procedure details: sterile dressing applied and wound care instructions given   Dressing type: bandage and petrolatum    Specimen 4 - Surgical pathology Differential Diagnosis: D48.5 r/o irritated hemangioma vs other Check Margins: No  Lentigines - Scattered tan macules - Due to sun exposure - Benign-appearing, observe - Recommend daily broad spectrum sunscreen SPF 30+ to sun-exposed areas, reapply every 2 hours as needed. - Call for any changes  Seborrheic Keratoses - Stuck-on, waxy, tan-brown papules and/or plaques  - Benign-appearing - Discussed benign etiology and prognosis. - Observe - Call for any changes  Melanocytic Nevi - Tan-brown and/or pink-flesh-colored symmetric macules and papules - Benign appearing on exam today - Observation - Call clinic for new or changing moles - Recommend daily use of broad spectrum spf 30+ sunscreen to sun-exposed areas.   Hemangiomas - R and L supra auricular scalp 0.3 cm each - Red papules - Discussed benign nature - Observe - Call for any changes  Actinic Damage - Chronic condition, secondary to cumulative UV/sun exposure - diffuse scaly erythematous macules with underlying dyspigmentation - Recommend daily broad spectrum sunscreen SPF 30+ to sun-exposed areas, reapply every 2  hours as needed.  - Staying in the shade or wearing long sleeves, sun glasses (UVA+UVB protection) and wide brim hats (4-inch brim around the entire circumference of the hat) are also recommended for sun protection.  - Call for new or changing lesions.  Cafe au Lait - L lower back paraspinal  - Tan patch - Genetic - Benign, observe - Call for any changes  Skin cancer screening performed today.  Return in about 1 year (around 10/01/2022) for TBSE.  Luther Redo, CMA, am acting as scribe for Sarina Ser, MD . Documentation: I have reviewed the above documentation for accuracy and completeness, and I agree with the above.  Sarina Ser, MD

## 2021-10-04 ENCOUNTER — Ambulatory Visit: Payer: 59 | Admitting: Family Medicine

## 2021-10-05 ENCOUNTER — Encounter: Payer: Self-pay | Admitting: Dermatology

## 2021-10-06 ENCOUNTER — Telehealth: Payer: Self-pay

## 2021-10-06 NOTE — Telephone Encounter (Signed)
Patient informed of pathology results 

## 2021-10-06 NOTE — Telephone Encounter (Signed)
-----   Message from Ralene Bathe, MD sent at 10/05/2021  7:09 PM EDT ----- Diagnosis 1. Skin , right scalp/temple HEMANGIOMA 2. Skin , left mid side SEBORRHEIC KERATOSIS, IRRITATED 3. Skin , right sternum HEMANGIOMA 4. Skin , right scalp supra auricular HEMANGIOMA  1,3,4 - all three benign Hemangioma = collection of blood vessels No further treatment needed. 2-benign irritated keratosis No further treatment needed

## 2021-10-26 NOTE — Progress Notes (Unsigned)
[place] Phone: [phone] Subjective:   [name], am serving as a scribe for Dr. Hulan Saas.  I'm seeing this patient by the request  of:  McLean-Scocuzza, Nino Glow, MD  CC: Back and neck pain follow-up  [id number]  Roger [name] is a 51 y.o. male coming in with complaint of back and neck pain. OMT 08/23/2021. Patient states have more neck pain than anything else at the moment.  Has been driving a lot.  Recently had a grandchild born.  Medications patient has been prescribed: None  Taking:         Reviewed prior external information including notes and imaging from previsou exam, outside providers and external EMR if available.   As well as notes that were available from care everywhere and other healthcare systems.  Past medical history, social, surgical and family history all reviewed in electronic medical record.  No pertanent information unless stated regarding to the chief complaint.   Past Medical History:  Diagnosis Date   Bradycardia    Chicken pox    COVID-19    01/07/20 with covid pneumonia [facility] s/p MAB infusion 01/13/20 [facility] ED   GERD (gastroesophageal reflux disease)    Hiatal hernia    small noted 2013    Hyperlipidemia    Kidney stone    Premature atrial contractions    Premature ventricular contraction    Vitamin D deficiency    Wrist fracture    right        Review of Systems:  No headache, visual changes, nausea, vomiting, diarrhea, constipation, dizziness, abdominal pain, skin rash, fevers, chills, night sweats, weight loss, swollen lymph nodes, body aches, joint swelling, chest pain, shortness of breath, mood changes. POSITIVE muscle aches  Objective  Blood pressure 120/76, pulse 60, height '6\' 7"'$  (2.007 m), weight (!) 306 lb (138.8 kg), SpO2 97 %.   General: No apparent distress alert and oriented x3 mood and affect normal, dressed  appropriately.  HEENT: Pupils equal, extraocular movements intact  Respiratory: Patient's speak in full sentences and does not appear short of breath  Cardiovascular: No lower extremity edema, non tender, no erythema  Neck exam does have significant loss of lordosis.  Some tenderness to palpation in the paraspinal musculature otherwise.  Left ankle exam does have some tenderness to palpation over the ATFL with a positive apprehension test noted.  Negative anterior drawer test though noted.  No pain over the peroneal tendinitis area over the fifth metatarsal.  Osteopathic findings  C3 flexed rotated and side bent right C5 flexed rotated and side bent right C7 flexed rotated and side bent right  T5 extended rotated and side bent right inhaled rib T8 extended rotated and side bent left L2 flexed rotated and side bent right Sacrum right on right   [id number]; 15 additional minutes spent for Therapeutic exercises as stated in above notes.  This included exercises focusing on stretching, strengthening, with significant focus on eccentric aspects.   Long term goals include an improvement in range of motion, strength, endurance as well as avoiding reinjury. Patient's frequency would include in 1-2 times a day, 3-5 times a week for a duration of 6-12 weeks. Ankle strengthening that included:  Basic range of motion exercises to allow proper full motion at ankle Stretching of the lower leg and hamstrings  Theraband exercises for the lower leg - inversion, eversion, dorsiflexion and plantarflexion each to be completed with a  theraband Balance exercises to increase proprioception Weight bearing exercises to increase strength and balance   Proper technique shown and discussed handout in great detail with ATC.  All questions were discussed and answered.      Assessment and Plan:  Degenerative disc disease, cervical Chronic problem with mild tightness noted.  Discussed with patient about icing regimen and  home exercises.  Has been traveling a lot more with patient having a new granddaughter.  Discussed which activities to do and which ones to avoid.  Follow-up again in 6 to 8 weeks.  Moderate left ankle sprain Patient does have more of a chronic issue noted.  We discussed with patient about an Aircast, work with Product/process development scientist to learn home exercises.  X-rays are pending.  Follow-up again in 6 weeks which should do well with conservative therapy discussed ice and topical anti-inflammatories    Nonallopathic problems  Decision today to treat with OMT was based on Physical Exam  After verbal consent patient was treated with HVLA, ME, FPR techniques in cervical, rib, thoracic, lumbar, and sacral  areas  Patient tolerated the procedure well with improvement in symptoms  Patient given exercises, stretches and lifestyle modifications  See medications in patient instructions if given  Patient will follow up in 4-8 weeks      The above documentation has been reviewed and is accurate and complete Lyndal Pulley, DO        Note: This dictation was prepared with Dragon dictation along with smaller phrase technology. Any transcriptional errors that result from this process are unintentional.

## 2021-10-27 ENCOUNTER — Encounter: Payer: Self-pay | Admitting: Family Medicine

## 2021-10-27 ENCOUNTER — Ambulatory Visit (INDEPENDENT_AMBULATORY_CARE_PROVIDER_SITE_OTHER): Payer: 59 | Admitting: Family Medicine

## 2021-10-27 ENCOUNTER — Ambulatory Visit (INDEPENDENT_AMBULATORY_CARE_PROVIDER_SITE_OTHER): Payer: 59

## 2021-10-27 VITALS — BP 120/76 | HR 60 | Ht 79.0 in | Wt 306.0 lb

## 2021-10-27 DIAGNOSIS — M25572 Pain in left ankle and joints of left foot: Secondary | ICD-10-CM | POA: Diagnosis not present

## 2021-10-27 DIAGNOSIS — M5030 Other cervical disc degeneration, unspecified cervical region: Secondary | ICD-10-CM | POA: Diagnosis not present

## 2021-10-27 DIAGNOSIS — M9908 Segmental and somatic dysfunction of rib cage: Secondary | ICD-10-CM

## 2021-10-27 DIAGNOSIS — S93402A Sprain of unspecified ligament of left ankle, initial encounter: Secondary | ICD-10-CM

## 2021-10-27 DIAGNOSIS — M9901 Segmental and somatic dysfunction of cervical region: Secondary | ICD-10-CM

## 2021-10-27 DIAGNOSIS — M9903 Segmental and somatic dysfunction of lumbar region: Secondary | ICD-10-CM | POA: Diagnosis not present

## 2021-10-27 DIAGNOSIS — M9902 Segmental and somatic dysfunction of thoracic region: Secondary | ICD-10-CM

## 2021-10-27 DIAGNOSIS — M9904 Segmental and somatic dysfunction of sacral region: Secondary | ICD-10-CM

## 2021-10-27 NOTE — Assessment & Plan Note (Signed)
Chronic problem with mild tightness noted.  Discussed with patient about icing regimen and home exercises.  Has been traveling a lot more with patient having a new granddaughter.  Discussed which activities to do and which ones to avoid.  Follow-up again in 6 to 8 weeks.

## 2021-10-27 NOTE — Assessment & Plan Note (Signed)
Patient does have more of a chronic issue noted.  We discussed with patient about an Aircast, work with Product/process development scientist to learn home exercises.  X-rays are pending.  Follow-up again in 6 weeks which should do well with conservative therapy discussed ice and topical anti-inflammatories

## 2021-10-27 NOTE — Patient Instructions (Addendum)
Aircast for brace Do prescribed exercises at least 3x a week Xray today  You have 14 days to return or exchange your brace Call [phone], then return the brace to our office

## 2021-12-07 NOTE — Progress Notes (Unsigned)
[name] [place] Phone: [phone] Subjective:   Roger [name], am serving as a scribe for Dr. Hulan Saas.  I'm seeing this patient by the request  of:  McLean-Scocuzza, Nino Glow, MD  CC: back and neck pain   [id number]  10/27/2021 Patient does have more of a chronic issue noted.  We discussed with patient about an Aircast, work with Product/process development scientist to learn home exercises.  X-rays are pending.  Follow-up again in 6 weeks which should do well with conservative therapy discussed ice and topical anti-inflammatories     Update 12/08/2021 Roger [name] is a 50 y.o. male coming in with complaint of back and neck pain. OMT 10/27/2021. L ankle pain f/u. Ankle doing very well, wore the brace for the for 2 weeks and then took it off.  Some tightness in the neck but nothing severe.  Medications patient has been prescribed: None        Reviewed prior external information including notes and imaging from previsou exam, outside providers and external EMR if available.   As well as notes that were available from care everywhere and other healthcare systems.  Past medical history, social, surgical and family history all reviewed in electronic medical record.  No pertanent information unless stated regarding to the chief complaint.   Past Medical History:  Diagnosis Date   Bradycardia    Chicken pox    COVID-19    01/07/20 with covid pneumonia [facility] s/p MAB infusion 01/13/20 [facility] ED   GERD (gastroesophageal reflux disease)    Hiatal hernia    small noted 2013    Hyperlipidemia    Kidney stone    Premature atrial contractions    Premature ventricular contraction    Vitamin D deficiency    Wrist fracture    right     No Known Allergies   Review of Systems:  No headache, visual changes, nausea, vomiting, diarrhea, constipation, dizziness, abdominal pain, skin rash, fevers, chills, night sweats,  weight loss, swollen lymph nodes, body aches, joint swelling, chest pain, shortness of breath, mood changes. POSITIVE muscle aches  Objective  Blood pressure 112/68, pulse 67, height '6\' 7"'$  (2.007 m), weight 300 lb (136.1 kg), SpO2 98 %.   General: No apparent distress alert and oriented x3 mood and affect normal, dressed appropriately.  HEENT: Pupils equal, extraocular movements intact  Respiratory: Patient's speak in full sentences and does not appear short of breath  Cardiovascular: No lower extremity edema, non tender, no erythema  Back does have some loss of lordosis.  Patient does have some limited range of motion of the neck in all planes.  Some crepitus noted.  Tightness noted in the C2 area on the right side being the worst.  Osteopathic findings  C3 flexed rotated and side bent right C6 flexed rotated and side bent left T8 extended rotated and side bent left L2 flexed rotated and side bent right Sacrum right on right       Assessment and Plan:  Degenerative disc disease, cervical Known arthritic changes.  Patient does have some degenerative disc disease.  Responded extremely well though to osteopathic manipulation.  Discussed with patient about icing regimen and home exercises, which activities to do and which ones to avoid.  Continue to watch posture and ergonomics.  Patient likely will be working a little less with him and finally getting some help at work.  Follow-up with me again in 8 weeks.  Nonallopathic probl trainer number ems  Decision today to treat with OMT was based on Physical Exam  After verbal consent patient was treated with HVLA, ME, FPR techniques in cervical,  thoracic, lumbar, and sacral  areas  Patient tolerated the procedure well with improvement in symptoms  Patient given exercises, stretches and lifestyle modifications  See medications in patient instructions if given  Patient will follow up in 4-8 weeks    The above documentation has  been reviewed and is accurate and complete Lyndal Pulley, DO          Note: This dictation was prepared with Dragon dictation along with smaller phrase technology. Any transcriptional errors that result from this process are unintentional.

## 2021-12-08 ENCOUNTER — Ambulatory Visit (INDEPENDENT_AMBULATORY_CARE_PROVIDER_SITE_OTHER): Payer: 59 | Admitting: Family Medicine

## 2021-12-08 VITALS — BP 112/68 | HR 67 | Ht 79.0 in | Wt 300.0 lb

## 2021-12-08 DIAGNOSIS — M5030 Other cervical disc degeneration, unspecified cervical region: Secondary | ICD-10-CM

## 2021-12-08 DIAGNOSIS — M9902 Segmental and somatic dysfunction of thoracic region: Secondary | ICD-10-CM | POA: Diagnosis not present

## 2021-12-08 DIAGNOSIS — M9903 Segmental and somatic dysfunction of lumbar region: Secondary | ICD-10-CM | POA: Diagnosis not present

## 2021-12-08 DIAGNOSIS — M9904 Segmental and somatic dysfunction of sacral region: Secondary | ICD-10-CM

## 2021-12-08 DIAGNOSIS — M9901 Segmental and somatic dysfunction of cervical region: Secondary | ICD-10-CM

## 2021-12-08 NOTE — Assessment & Plan Note (Signed)
Known arthritic changes.  Patient does have some degenerative disc disease.  Responded extremely well though to osteopathic manipulation.  Discussed with patient about icing regimen and home exercises, which activities to do and which ones to avoid.  Continue to watch posture and ergonomics.  Patient likely will be working a little less with him and finally getting some help at work.  Follow-up with me again in 8 weeks.

## 2021-12-08 NOTE — Patient Instructions (Signed)
PRP handout See me again in 8-10 weeks

## 2022-01-19 NOTE — Progress Notes (Signed)
[place] [address] [place] Phone: [phone] Subjective:   Roger Roger [name], am serving as a scribe for Dr. Hulan Saas.  I'm seeing this patient by the request  of:  McLean-Scocuzza, Roger Glow, MD  CC: Back and neck pain follow-up  [id number]  Roger Roger [name] is a 51 y.o. male coming in with complaint of back and neck pain. OMT 12/08/2021. Patient states having more tightness recently with patient having to travel a lot more for the holidays.  Patient did do approximately 2100 miles of driving.  Patient feels like that may have exacerbated some tightness.  Medications patient has been prescribed: None  Taking:         Reviewed prior external information including notes and imaging from previsou exam, outside providers and external EMR if available.   As well as notes that were available from care everywhere and other healthcare systems.  Past medical history, social, surgical and family history all reviewed in electronic medical record.  Roger [name] pertanent information unless stated regarding to the chief complaint.   Past Medical History:  Diagnosis Date   Bradycardia    Chicken pox    COVID-19    01/07/20 with covid pneumonia [facility] s/p MAB infusion 01/13/20 [facility] ED   GERD (gastroesophageal reflux disease)    Hiatal hernia    small noted 2013    Hyperlipidemia    Kidney stone    Premature atrial contractions    Premature ventricular contraction    Vitamin D deficiency    Wrist fracture    right     Roger [name] Known Allergies   Review of Systems:  Roger [name] headache, visual changes, nausea, vomiting, diarrhea, constipation, dizziness, abdominal pain, skin rash, fevers, chills, night sweats, weight loss, swollen lymph nodes, body aches, joint swelling, chest pain, shortness of breath, mood changes. POSITIVE muscle aches  Objective  Blood pressure 124/80, pulse 72, height '6\' 7"'$  (2.007 m), weight (!) 302 lb (137 kg),  SpO2 98 %.   General: Roger [name] apparent distress alert and oriented x3 mood and affect normal, dressed appropriately.  HEENT: Pupils equal, extraocular movements intact  Respiratory: Patient's speak in full sentences and does not appear short of breath  Cardiovascular: Roger [name] lower extremity edema, non tender, Roger [name] erythema  Gait MSK:  Back   Osteopathic findings  C2 flexed rotated and side bent right C5 flexed rotated and side bent left C7 flexed rotated and side bent right T3 extended rotated and side bent right inhaled rib T9 extended rotated and side bent left L1 flexed rotated and side bent right Sacrum right on right       Assessment and Plan:  Degenerative disc disease, cervical Spine extremely well to osteopathic manipulation.  Had some more tightness noted in the lumbar spine as well today.  This was likely secondary to patient having more discomfort as well.  Discussed posture and ergonomics.  Follow-up with me again in 6 to 8 weeks.    Nonallopathic problems  Decision today to treat with OMT was based on Physical Exam  After verbal consent patient was treated with HVLA, ME, FPR techniques in cervical, rib, thoracic, lumbar, and sacral  areas  Patient tolerated the procedure well with improvement in symptoms  Patient given exercises, stretches and lifestyle modifications  See medications in patient instructions if given  Patient will follow up in 4-8 weeks    The above documentation has been reviewed and is accurate and complete Roger Roger [name]  Roger Julian, DO          Note: This dictation was prepared with Dragon dictation along with smaller phrase technology. Any transcriptional errors that result from this process are unintentional.

## 2022-01-25 ENCOUNTER — Ambulatory Visit (INDEPENDENT_AMBULATORY_CARE_PROVIDER_SITE_OTHER): Payer: 59 | Admitting: Family Medicine

## 2022-01-25 VITALS — BP 124/80 | HR 72 | Ht 79.0 in | Wt 302.0 lb

## 2022-01-25 DIAGNOSIS — M9908 Segmental and somatic dysfunction of rib cage: Secondary | ICD-10-CM | POA: Diagnosis not present

## 2022-01-25 DIAGNOSIS — M5030 Other cervical disc degeneration, unspecified cervical region: Secondary | ICD-10-CM | POA: Diagnosis not present

## 2022-01-25 DIAGNOSIS — M9902 Segmental and somatic dysfunction of thoracic region: Secondary | ICD-10-CM

## 2022-01-25 DIAGNOSIS — M9904 Segmental and somatic dysfunction of sacral region: Secondary | ICD-10-CM | POA: Diagnosis not present

## 2022-01-25 DIAGNOSIS — M9901 Segmental and somatic dysfunction of cervical region: Secondary | ICD-10-CM

## 2022-01-25 DIAGNOSIS — M9903 Segmental and somatic dysfunction of lumbar region: Secondary | ICD-10-CM | POA: Diagnosis not present

## 2022-01-25 NOTE — Patient Instructions (Signed)
Good to see you Good luck with grandkid #5 See me in 7-8 weeks

## 2022-01-25 NOTE — Assessment & Plan Note (Signed)
Spine extremely well to osteopathic manipulation.  Had some more tightness noted in the lumbar spine as well today.  This was likely secondary to patient having more discomfort as well.  Discussed posture and ergonomics.  Follow-up with me again in 6 to 8 weeks.

## 2022-02-17 ENCOUNTER — Ambulatory Visit (INDEPENDENT_AMBULATORY_CARE_PROVIDER_SITE_OTHER): Payer: Commercial Managed Care - PPO | Admitting: Internal Medicine

## 2022-02-17 VITALS — BP 122/76 | HR 60 | Temp 97.7°F | Ht 79.0 in | Wt 312.4 lb

## 2022-02-17 DIAGNOSIS — R300 Dysuria: Secondary | ICD-10-CM

## 2022-02-17 DIAGNOSIS — R7301 Impaired fasting glucose: Secondary | ICD-10-CM

## 2022-02-17 DIAGNOSIS — N342 Other urethritis: Secondary | ICD-10-CM | POA: Diagnosis not present

## 2022-02-17 DIAGNOSIS — R21 Rash and other nonspecific skin eruption: Secondary | ICD-10-CM | POA: Diagnosis not present

## 2022-02-17 LAB — CBC WITH DIFFERENTIAL/PLATELET
Basophils Absolute: 0 10*3/uL (ref 0.0–0.1)
Basophils Relative: 0.5 % (ref 0.0–3.0)
Eosinophils Absolute: 0.2 10*3/uL (ref 0.0–0.7)
Eosinophils Relative: 2.3 % (ref 0.0–5.0)
HCT: 46.6 % (ref 39.0–52.0)
Hemoglobin: 15.7 g/dL (ref 13.0–17.0)
Lymphocytes Relative: 31.7 % (ref 12.0–46.0)
Lymphs Abs: 2.2 10*3/uL (ref 0.7–4.0)
MCHC: 33.7 g/dL (ref 30.0–36.0)
MCV: 88.6 fl (ref 78.0–100.0)
Monocytes Absolute: 0.5 10*3/uL (ref 0.1–1.0)
Monocytes Relative: 7.8 % (ref 3.0–12.0)
Neutro Abs: 4.1 10*3/uL (ref 1.4–7.7)
Neutrophils Relative %: 57.7 % (ref 43.0–77.0)
Platelets: 182 10*3/uL (ref 150.0–400.0)
RBC: 5.26 Mil/uL (ref 4.22–5.81)
RDW: 14.3 % (ref 11.5–15.5)
WBC: 7 10*3/uL (ref 4.0–10.5)

## 2022-02-17 LAB — URINALYSIS, ROUTINE W REFLEX MICROSCOPIC
Bilirubin Urine: NEGATIVE
Hgb urine dipstick: NEGATIVE
Ketones, ur: NEGATIVE
Leukocytes,Ua: NEGATIVE
Nitrite: NEGATIVE
RBC / HPF: NONE SEEN (ref 0–?)
Specific Gravity, Urine: 1.015 (ref 1.000–1.030)
Total Protein, Urine: NEGATIVE
Urine Glucose: NEGATIVE
Urobilinogen, UA: 0.2 (ref 0.0–1.0)
WBC, UA: NONE SEEN (ref 0–?)
pH: 6 (ref 5.0–8.0)

## 2022-02-17 LAB — POCT URINALYSIS DIPSTICK
Bilirubin, UA: NEGATIVE
Blood, UA: NEGATIVE
Glucose, UA: NEGATIVE
Ketones, UA: NEGATIVE
Leukocytes, UA: NEGATIVE
Nitrite, UA: NEGATIVE
Protein, UA: NEGATIVE
Spec Grav, UA: 1.015 (ref 1.010–1.025)
Urobilinogen, UA: 0.2 E.U./dL
pH, UA: 6 (ref 5.0–8.0)

## 2022-02-17 LAB — COMPREHENSIVE METABOLIC PANEL
ALT: 25 U/L (ref 0–53)
AST: 21 U/L (ref 0–37)
Albumin: 4.7 g/dL (ref 3.5–5.2)
Alkaline Phosphatase: 52 U/L (ref 39–117)
BUN: 15 mg/dL (ref 6–23)
CO2: 25 mEq/L (ref 19–32)
Calcium: 9.2 mg/dL (ref 8.4–10.5)
Chloride: 104 mEq/L (ref 96–112)
Creatinine, Ser: 0.96 mg/dL (ref 0.40–1.50)
GFR: 91.43 mL/min (ref >60.00)
Glucose, Bld: 96 mg/dL (ref 70–99)
Potassium: 3.9 mEq/L (ref 3.5–5.1)
Sodium: 138 mEq/L (ref 135–145)
Total Bilirubin: 0.6 mg/dL (ref 0.2–1.2)
Total Protein: 7.4 g/dL (ref 6.0–8.3)

## 2022-02-17 LAB — HEMOGLOBIN A1C: Hgb A1c MFr Bld: 5.5 % (ref 4.6–6.5)

## 2022-02-17 MED ORDER — TRIAMCINOLONE ACETONIDE 0.5 % EX OINT: 1.0000 | TOPICAL_OINTMENT | Freq: Two times a day (BID) | CUTANEOUS | 0 refills | Status: DC

## 2022-02-17 NOTE — Progress Notes (Signed)
Subjective:  Patient ID: Roger [name], male    DOB: 09/11/1970  Age: 52 y.o. MRN: [id number]  CC: The primary encounter diagnosis was Dysuria. Diagnoses of Rash of genital area, Impaired fasting glucose, Urethritis, and Skin rash were also pertinent to this visit.   HPI Roger [name] presents for  Chief Complaint  Patient presents with   Dysuria     Started 2-3 weeks ago. Pt reports improvement but still there.    Rash    Inside of left thigh started about 1 month ago.    Dysuria intermitted for the past sceeral weeks. Denies urethral discharge, fevers  body /joint aches and hematiuria.  NO prior episodes.  Sexaully active with wife only.   Raised pruritic pink rash on left medial thigh , scrotum has also become affected/itchy .    Travelled one moth ago, stayed with family.  No hotel nights.       Outpatient Medications Prior to Visit  Medication Sig Dispense Refill   Cholecalciferol (VITAMIN D3) 1.25 MG (50000 UT) TABS Take 1 tablet by mouth daily.     pantoprazole (PROTONIX) 20 MG tablet Take 1 tablet (20 mg total) by mouth every morning. 30 minutes before food 90 tablet 3   tadalafil (CIALIS) 5 MG tablet Take 5 mg by mouth daily.     No facility-administered medications prior to visit.    Review of Systems;  Patient denies headache, fevers, malaise, unintentional weight loss, eye pain, sinus congestion and sinus pain, sore throat, dysphagia,  hemoptysis , cough, dyspnea, wheezing, chest pain, palpitations, orthopnea, edema, abdominal pain, nausea, melena, diarrhea, constipation, flank pain,  nocturia, numbness, tingling, seizures,  Focal weakness, Loss of consciousness,  Tremor, insomnia, depression, anxiety, and suicidal ideation.      Objective:  BP 122/76   Pulse 60   Temp 97.7 F (36.5 C) (Oral)   Ht '6\' 7"'$  (2.007 m)   Wt (!) 312 lb 6.4 oz (141.7 kg)   SpO2 98%   BMI 35.19 kg/m   BP Readings from Last 3 Encounters:  02/17/22 122/76  01/25/22 124/80  12/08/21  112/68    Wt Readings from Last 3 Encounters:  02/17/22 (!) 312 lb 6.4 oz (141.7 kg)  01/25/22 (!) 302 lb (137 kg)  12/08/21 300 lb (136.1 kg)    Physical Exam Vitals reviewed.  Constitutional:      General: He is not in acute distress.    Appearance: Normal appearance. He is normal weight. He is not ill-appearing, toxic-appearing or diaphoretic.  HENT:     Head: Normocephalic and atraumatic.     Right Ear: Tympanic membrane, ear canal and external ear normal. There is no impacted cerumen.     Left Ear: Tympanic membrane, ear canal and external ear normal. There is no impacted cerumen.     Nose: Nose normal.     Mouth/Throat:     Mouth: Mucous membranes are moist.     Pharynx: Oropharynx is clear.  Eyes:     General: No scleral icterus.       Right eye: No discharge.        Left eye: No discharge.     Conjunctiva/sclera: Conjunctivae normal.  Neck:     Thyroid: No thyromegaly.     Vascular: No carotid bruit or JVD.  Cardiovascular:     Rate and Rhythm: Normal rate and regular rhythm.     Heart sounds: Normal heart sounds.  Pulmonary:     Effort: Pulmonary effort  is normal. No respiratory distress.     Breath sounds: Normal breath sounds.  Abdominal:     General: Bowel sounds are normal.     Palpations: Abdomen is soft. There is no mass.     Tenderness: There is no abdominal tenderness. There is no guarding or rebound.  Musculoskeletal:        General: Normal range of motion.     Cervical back: Normal range of motion and neck supple.  Lymphadenopathy:     Cervical: No cervical adenopathy.  Skin:    General: Skin is warm and dry.     Findings: Rash present. Rash is papular and urticarial.          Comments: Papular solid elongated skin lesion left medial thigh Papular rash of scrotum less prominent   Neurological:     General: No focal deficit present.     Mental Status: He is alert and oriented to person, place, and time. Mental status is at baseline.   Psychiatric:        Mood and Affect: Mood normal.        Behavior: Behavior normal.        Thought Content: Thought content normal.        Judgment: Judgment normal.     Lab Results  Component Value Date   HGBA1C 5.5 02/17/2022   HGBA1C 5.5 05/29/2017    Lab Results  Component Value Date   CREATININE 0.96 02/17/2022   CREATININE 0.99 02/25/2021   CREATININE 0.83 02/07/2020    Lab Results  Component Value Date   WBC 7.0 02/17/2022   HGB 15.7 02/17/2022   HCT 46.6 02/17/2022   PLT 182.0 02/17/2022   GLUCOSE 96 02/17/2022   CHOL 198 02/25/2021   TRIG 138.0 02/25/2021   HDL 38.40 (L) 02/25/2021   LDLCALC 132 (H) 02/25/2021   ALT 25 02/17/2022   AST 21 02/17/2022   NA 138 02/17/2022   K 3.9 02/17/2022   CL 104 02/17/2022   CREATININE 0.96 02/17/2022   BUN 15 02/17/2022   CO2 25 02/17/2022   TSH 3.06 02/25/2021   PSA 1.24 02/25/2021   HGBA1C 5.5 02/17/2022    CT Abdomen Pelvis Wo Contrast  Result Date: 07/03/2020 CLINICAL DATA:  Intermittent abdominal pain for 1 month EXAM: CT ABDOMEN AND PELVIS WITHOUT CONTRAST TECHNIQUE: Multidetector CT imaging of the abdomen and pelvis was performed following the standard protocol without IV contrast. COMPARISON:  10/07/2011 FINDINGS: Lower chest: No acute abnormality. Hepatobiliary: No focal liver abnormality is seen. No gallstones, gallbladder wall thickening, or biliary dilatation. Pancreas: Unremarkable. No pancreatic ductal dilatation or surrounding inflammatory changes. Spleen: Normal in size without focal abnormality. Adrenals/Urinary Tract: Adrenal glands are within normal limits. Kidneys are well visualized bilaterally. No renal calculi are seen. Exophytic cyst is noted in the left kidney increased in size from the prior exam now measuring 2 cm. No obstructive changes are seen. Bladder is within normal limits. Stomach/Bowel: Colon shows no obstructive or inflammatory changes. Mild diverticular change is seen. The appendix is  within normal limits. Small bowel shows no obstructive change. Stomach is within normal limits. Small hiatal hernia is noted. Vascular/Lymphatic: No significant vascular findings are present. No enlarged abdominal or pelvic lymph nodes. Reproductive: Prostate is unremarkable. Other: No abdominal wall hernia or abnormality. No abdominopelvic ascites. Musculoskeletal: Mild degenerative changes of lumbar spine are noted. IMPRESSION: Small left renal cyst increased in size from the prior study. Mild diverticular change without diverticulitis. Small hiatal hernia. No  other focal abnormality is noted. Electronically Signed   By: Inez Catalina M.D.   On: 07/03/2020 21:41    Assessment & Plan:  .Dysuria -     POCT urinalysis dipstick -     Urinalysis, Routine w reflex microscopic -     Urine Culture -     HIV Antibody (routine testing w rflx) -     GC/Chlamydia Probe Amp  Rash of genital area -     RPR -     CBC with Differential/Platelet  Impaired fasting glucose -     Hemoglobin A1c -     Comprehensive metabolic panel  Urethritis Assessment & Plan: Etiology unclear,  UA and culture normal and screening for STDS  still in progress.  Empiric  with doxycyeline  If GC/chlamydia is negative and symptoms persist, will refer to urology for cystoscopy    Skin rash Assessment & Plan: Lesion on left medial thigh unclear etiology .  RPR screen negative.  Empiric trial of triamcinolone   Other orders -     Triamcinolone Acetonide; Apply 1 Application topically 2 (two) times daily. To rash  Dispense: 30 g; Refill: 0 -     Doxycycline Hyclate; Take 1 tablet (100 mg total) by mouth 2 (two) times daily.  Dispense: 14 tablet; Refill: 0    Follow-up: No follow-ups on file.   Crecencio Mc, MD

## 2022-02-17 NOTE — Patient Instructions (Signed)
Your preliminary urinalysis is normal  I am running some additional tests on your urine and on the swab from your urethra to determine if infection is present   I have called in a steroid cream to use on the rash twice daily until gone

## 2022-02-18 LAB — URINE CULTURE
MICRO NUMBER:: 14473203
Result:: NO GROWTH
SPECIMEN QUALITY:: ADEQUATE

## 2022-02-18 LAB — HIV ANTIBODY (ROUTINE TESTING W REFLEX): HIV 1&2 Ab, 4th Generation: NONREACTIVE

## 2022-02-18 LAB — RPR: RPR Ser Ql: NONREACTIVE

## 2022-02-19 ENCOUNTER — Encounter: Payer: Self-pay | Admitting: Internal Medicine

## 2022-02-19 DIAGNOSIS — R21 Rash and other nonspecific skin eruption: Secondary | ICD-10-CM | POA: Insufficient documentation

## 2022-02-19 DIAGNOSIS — N342 Other urethritis: Secondary | ICD-10-CM | POA: Insufficient documentation

## 2022-02-19 MED ORDER — DOXYCYCLINE HYCLATE 100 MG PO TABS: 100.0000 mg | ORAL_TABLET | Freq: Two times a day (BID) | ORAL | 0 refills | Status: DC

## 2022-02-19 NOTE — Assessment & Plan Note (Signed)
Lesion on left medial thigh unclear etiology .  RPR screen negative.  Empiric trial of triamcinolone

## 2022-02-19 NOTE — Assessment & Plan Note (Signed)
Etiology unclear,  UA and culture normal and screening for STDS  still in progress.  Empiric  with doxycyeline  If GC/chlamydia is negative and symptoms persist, will refer to urology for cystoscopy

## 2022-02-21 ENCOUNTER — Telehealth: Payer: Self-pay

## 2022-02-21 NOTE — Telephone Encounter (Signed)
Mychart message sent for results

## 2022-02-22 ENCOUNTER — Other Ambulatory Visit: Payer: Self-pay | Admitting: Family Medicine

## 2022-02-22 LAB — GC/CHLAMYDIA PROBE AMP
Chlamydia trachomatis, NAA: NEGATIVE
Neisseria Gonorrhoeae by PCR: NEGATIVE

## 2022-03-04 ENCOUNTER — Encounter: Payer: BC Managed Care – PPO | Admitting: Internal Medicine

## 2022-03-22 ENCOUNTER — Ambulatory Visit: Payer: 59 | Admitting: Family Medicine

## 2022-04-08 NOTE — Progress Notes (Unsigned)
Tomasita Morrow, NP-C Phone: [phone]  Roger [name] is a 52 y.o. male who presents today for transfer of care and annual exam. He has no complaints or new concerns today. He is doing well on all of his medications and requesting refills.   GERD:   Reflux symptoms: None   Abd pain: No   Blood in stool: No  Dysphagia: No   EGD: No  Medication: Protonix  HYPERLIPIDEMIA Symptoms Chest pain on exertion:  No   Leg claudication:   No Medications: Compliance- Diet controlled Right upper quadrant pain- No  Muscle aches- No Lipid Panel     Component Value Date/Time   CHOL 198 02/25/2021 1019   TRIG 138.0 02/25/2021 1019   HDL 38.40 (L) 02/25/2021 1019   CHOLHDL 5 02/25/2021 1019   VLDL 27.6 02/25/2021 1019   LDLCALC 132 (H) 02/25/2021 1019   LDLCALC 133 (H) 11/02/2017 0817    Diet: Not good- not a lot of vegetables, usually fast food for lunch Exercise: None Colonoscopy: 10/25/2019- 5 year recall- Due 2026 Prostate cancer screening: 2023 Family history-  Prostate cancer: Yes- Father  Colon cancer: Yes- Father Sexually active: Yes  Vaccines-   Flu: Declined  Tetanus: 06/05/2016  Shingles: Declined  COVID19: Never HIV screening: Negative Hep C Screening: Negative Tobacco use: No Alcohol use: Occasionally once a month Illicit Drug use: No Dentist: Yes Ophthalmology: Yes   Social History   Tobacco Use  Smoking Status Former   Packs/day: 1.00   Years: 12.00   Additional pack years: 0.00   Total pack years: 12.00   Types: Cigarettes   Quit date: 2003   Years since quitting: 21.2  Smokeless Tobacco Former   Types: Chew  Tobacco Comments   Quit 20 years ago.    Current Outpatient Medications on File Prior to Visit  Medication Sig Dispense Refill   Cholecalciferol (VITAMIN D3) 1.25 MG (50000 UT) TABS Take 1 tablet by mouth daily.     No current facility-administered medications on file prior to visit.    ROS see history of present  illness  Objective  Physical Exam Vitals:   04/12/22 1255 04/12/22 1313  BP: 138/72 138/82  Pulse: 97 (!) 55  Temp: 98.4 F (36.9 C)   SpO2: 97%     BP Readings from Last 3 Encounters:  04/12/22 138/82  02/17/22 122/76  01/25/22 124/80   Wt Readings from Last 3 Encounters:  04/12/22 (!) 304 lb 9.6 oz (138.2 kg)  02/17/22 (!) 312 lb 6.4 oz (141.7 kg)  01/25/22 (!) 302 lb (137 kg)    Physical Exam Constitutional:      General: He is not in acute distress.    Appearance: Normal appearance.  HENT:     Head: Normocephalic.     Right Ear: Tympanic membrane normal.     Left Ear: Tympanic membrane normal.     Nose: Nose normal.     Mouth/Throat:     Mouth: Mucous membranes are moist.     Pharynx: Oropharynx is clear.  Eyes:     Conjunctiva/sclera: Conjunctivae normal.     Pupils: Pupils are equal, round, and reactive to light.  Neck:     Thyroid: No thyromegaly.  Cardiovascular:     Rate and Rhythm: Regular rhythm. Bradycardia present.     Heart sounds: Normal heart sounds.  Pulmonary:     Effort: Pulmonary effort is normal.     Breath sounds: Normal breath sounds.  Abdominal:     General:  Abdomen is flat. Bowel sounds are normal.     Palpations: Abdomen is soft. There is no mass.     Tenderness: There is no abdominal tenderness.  Musculoskeletal:        General: Normal range of motion.  Lymphadenopathy:     Cervical: No cervical adenopathy.  Skin:    General: Skin is warm and dry.     Findings: No rash.  Neurological:     General: No focal deficit present.     Mental Status: He is alert.  Psychiatric:        Mood and Affect: Mood normal.        Behavior: Behavior normal.    Assessment/Plan: Please see individual problem list.  Preventative health care Assessment & Plan: Physical exam complete. Lab work as outlined. Will contact patient with results. Colonoscopy- UTD. PSA- will check today. Declined Flu, Shingles and COVID vaccines. Tetanus vaccine- UTD.  HIV/Hep C screenings negative. Advised annual follow ups with Dentist and Ophthalmology. Encouraged healthy diet and exercise.   Orders: -     Lipid panel -     TSH -     Comprehensive metabolic panel -     CBC with Differential/Platelet  Gastroesophageal reflux disease, unspecified whether esophagitis present Assessment & Plan: Chronic. Stable on Protonix 20 mg daily. Continue. Refills sent.   Orders: -     Pantoprazole Sodium; Take 1 tablet (20 mg total) by mouth every morning. 30 minutes before food  Dispense: 90 tablet; Refill: 3  Vitamin D deficiency Assessment & Plan: Chronic. Has been taking OTC daily supplement. Continue. Will check Vitamin D level today.   Orders: -     VITAMIN D 25 Hydroxy (Vit-D Deficiency, Fractures)  Hyperlipidemia, unspecified hyperlipidemia type Assessment & Plan: Chronic. Stable with diet control. Continue. Will check lipids today, if worsening will add statin. Encouraged healthy diet and exercise. The 10-year ASCVD risk score (Arnett DK, et al., 2019) is: 5.6%.   Orders: -     Lipid panel  Bradycardia Assessment & Plan: Chronic. Stable. Pulse 55 today, normal heart sounds and regular rhythm on exam. Will continue to monitor.   Erectile dysfunction, unspecified erectile dysfunction type -     Tadalafil; Take 1 tablet (5 mg total) by mouth daily as needed for erectile dysfunction.  Dispense: 30 tablet; Refill: 5  FH: prostate cancer -     PSA  Screening PSA (prostate specific antigen) -     PSA  Thyroid disorder screen -     TSH   Return in about 1 year (around 04/12/2023) for Annual Exam.   Tomasita Morrow, NP-C [place]

## 2022-04-12 ENCOUNTER — Encounter: Payer: Self-pay | Admitting: Nurse Practitioner

## 2022-04-12 ENCOUNTER — Ambulatory Visit (INDEPENDENT_AMBULATORY_CARE_PROVIDER_SITE_OTHER): Payer: Commercial Managed Care - PPO | Admitting: Nurse Practitioner

## 2022-04-12 VITALS — BP 138/82 | HR 55 | Temp 98.4°F | Ht 79.0 in | Wt 304.6 lb

## 2022-04-12 DIAGNOSIS — E559 Vitamin D deficiency, unspecified: Secondary | ICD-10-CM | POA: Diagnosis not present

## 2022-04-12 DIAGNOSIS — E785 Hyperlipidemia, unspecified: Secondary | ICD-10-CM

## 2022-04-12 DIAGNOSIS — Z0000 Encounter for general adult medical examination without abnormal findings: Secondary | ICD-10-CM | POA: Diagnosis not present

## 2022-04-12 DIAGNOSIS — Z1329 Encounter for screening for other suspected endocrine disorder: Secondary | ICD-10-CM

## 2022-04-12 DIAGNOSIS — R001 Bradycardia, unspecified: Secondary | ICD-10-CM

## 2022-04-12 DIAGNOSIS — Z8042 Family history of malignant neoplasm of prostate: Secondary | ICD-10-CM

## 2022-04-12 DIAGNOSIS — K219 Gastro-esophageal reflux disease without esophagitis: Secondary | ICD-10-CM | POA: Diagnosis not present

## 2022-04-12 DIAGNOSIS — N529 Male erectile dysfunction, unspecified: Secondary | ICD-10-CM | POA: Insufficient documentation

## 2022-04-12 DIAGNOSIS — Z125 Encounter for screening for malignant neoplasm of prostate: Secondary | ICD-10-CM | POA: Diagnosis not present

## 2022-04-12 LAB — COMPREHENSIVE METABOLIC PANEL
ALT: 29 U/L (ref 0–53)
AST: 21 U/L (ref 0–37)
Albumin: 4.6 g/dL (ref 3.5–5.2)
Alkaline Phosphatase: 57 U/L (ref 39–117)
BUN: 10 mg/dL (ref 6–23)
CO2: 25 mEq/L (ref 19–32)
Calcium: 9.4 mg/dL (ref 8.4–10.5)
Chloride: 103 mEq/L (ref 96–112)
Creatinine, Ser: 0.89 mg/dL (ref 0.40–1.50)
GFR: 99.02 mL/min (ref >60.00)
Glucose, Bld: 79 mg/dL (ref 70–99)
Potassium: 3.7 mEq/L (ref 3.5–5.1)
Sodium: 139 mEq/L (ref 135–145)
Total Bilirubin: 0.7 mg/dL (ref 0.2–1.2)
Total Protein: 7 g/dL (ref 6.0–8.3)

## 2022-04-12 LAB — TSH: TSH: 2.16 u[IU]/mL (ref 0.35–5.50)

## 2022-04-12 LAB — CBC WITH DIFFERENTIAL/PLATELET
Basophils Absolute: 0 10*3/uL (ref 0.0–0.1)
Basophils Relative: 0.5 % (ref 0.0–3.0)
Eosinophils Absolute: 0.1 10*3/uL (ref 0.0–0.7)
Eosinophils Relative: 1.6 % (ref 0.0–5.0)
HCT: 46 % (ref 39.0–52.0)
Hemoglobin: 15.4 g/dL (ref 13.0–17.0)
Lymphocytes Relative: 30.4 % (ref 12.0–46.0)
Lymphs Abs: 1.9 10*3/uL (ref 0.7–4.0)
MCHC: 33.5 g/dL (ref 30.0–36.0)
MCV: 88.2 fl (ref 78.0–100.0)
Monocytes Absolute: 0.6 10*3/uL (ref 0.1–1.0)
Monocytes Relative: 9.5 % (ref 3.0–12.0)
Neutro Abs: 3.5 10*3/uL (ref 1.4–7.7)
Neutrophils Relative %: 58 % (ref 43.0–77.0)
Platelets: 204 10*3/uL (ref 150.0–400.0)
RBC: 5.22 Mil/uL (ref 4.22–5.81)
RDW: 14.5 % (ref 11.5–15.5)
WBC: 6.1 10*3/uL (ref 4.0–10.5)

## 2022-04-12 LAB — LIPID PANEL
Cholesterol: 183 mg/dL (ref 0–200)
HDL: 36.7 mg/dL — ABNORMAL LOW (ref >39.00)
LDL Cholesterol: 118 mg/dL — ABNORMAL HIGH (ref 0–99)
NonHDL: 145.8
Total CHOL/HDL Ratio: 5
Triglycerides: 138 mg/dL (ref 0.0–149.0)
VLDL: 27.6 mg/dL (ref 0.0–40.0)

## 2022-04-12 LAB — VITAMIN D 25 HYDROXY (VIT D DEFICIENCY, FRACTURES): VITD: 34.71 ng/mL (ref 30.00–100.00)

## 2022-04-12 LAB — PSA: PSA: 1.42 ng/mL (ref 0.10–4.00)

## 2022-04-12 MED ORDER — TADALAFIL 5 MG PO TABS: 5.0000 mg | ORAL_TABLET | Freq: Every day | ORAL | 5 refills | Status: DC | PRN

## 2022-04-12 MED ORDER — PANTOPRAZOLE SODIUM 20 MG PO TBEC: 20.0000 mg | DELAYED_RELEASE_TABLET | ORAL | 3 refills | Status: DC

## 2022-04-12 NOTE — Assessment & Plan Note (Signed)
Chronic. Has been taking OTC daily supplement. Continue. Will check Vitamin D level today.

## 2022-04-12 NOTE — Assessment & Plan Note (Signed)
Chronic. Stable on Protonix 20 mg daily. Continue. Refills sent.

## 2022-04-12 NOTE — Assessment & Plan Note (Addendum)
Chronic. Stable with diet control. Continue. Will check lipids today, if worsening will add statin. Encouraged healthy diet and exercise. The 10-year ASCVD risk score (Arnett DK, et al., 2019) is: 5.6%.

## 2022-04-12 NOTE — Assessment & Plan Note (Signed)
Physical exam complete. Lab work as outlined. Will contact patient with results. Colonoscopy- UTD. PSA- will check today. Declined Flu, Shingles and COVID vaccines. Tetanus vaccine- UTD. HIV/Hep C screenings negative. Advised annual follow ups with Dentist and Ophthalmology. Encouraged healthy diet and exercise.

## 2022-04-12 NOTE — Assessment & Plan Note (Signed)
Chronic. Stable. Pulse 55 today, normal heart sounds and regular rhythm on exam. Will continue to monitor.

## 2022-05-02 NOTE — Progress Notes (Unsigned)
[name] [address] [place] [address] Phone: [phone] Subjective:   Roger [name], am serving as a scribe for Dr. Antoine Primas.  I'm seeing this patient by the request  of:  Bethanie Dicker, NP  CC: back and neck pain   [id number]  Roger [name] is a 52 y.o. male coming in with complaint of back and neck pain. OMT on 01/25/2022. Patient states he is not having back pain at the moment and would like to focus on his knee issue he is having. Patient statest hat he was trying to get in bed he turned his knee but the foot did not turn and he heard a pop. Patient states he wore a knee brace for about a week and everything seemed to be fine. Patient cannot remember what he did the second time he eard a pop but that it popped and he wore the brace for a while again and everything was great. Patient states he was able to do yard stuff and stuff to the outside of the house Saturday, but Saturday night he bent down to put the brackets on the toilet and when he went to get up he could barely walk or put any pressure on his knee. Sunday patient went to [facility] and had an x ray and was told nothing was broken but it looked like a possible torn meniscus. Patient is still wearing the brace, taking tylenol, and ibuprofen. Pain is worse with any pressure or with trying to bend the knee.   Medications patient has been prescribed:   Taking:         Reviewed prior external information including notes and imaging from previsou exam, outside providers and external EMR if available.   As well as notes that were available from care everywhere and other healthcare systems.  Past medical history, social, surgical and family history all reviewed in electronic medical record.  No pertanent information unless stated regarding to the chief complaint.   Past Medical History:  Diagnosis Date   Bradycardia    Chicken pox    COVID-19    01/07/20 with covid  pneumonia [facility] s/p MAB infusion 01/13/20 [facility] ED   GERD (gastroesophageal reflux disease)    Hiatal hernia    small noted 2013    Hyperlipidemia    Kidney stone    Premature atrial contractions    Premature ventricular contraction    Vitamin D deficiency    Wrist fracture    right     No Known Allergies   Review of Systems:  No headache, visual changes, nausea, vomiting, diarrhea, constipation, dizziness, abdominal pain, skin rash, fevers, chills, night sweats, weight loss, swollen lymph nodes, body aches, joint swelling, chest pain, shortness of breath, mood changes. POSITIVE muscle aches  Objective  Blood pressure 110/70, pulse 62, height 6\' 7"  (2.007 m), SpO2 96 %.   General: No apparent distress alert and oriented x3 mood and affect normal, dressed appropriately.  HEENT: Pupils equal, extraocular movements intact  Respiratory: Patient's speak in full sentences and does not appear short of breath  Severely antalgic gait favoring the right knee.  Patient does have what appears to be swelling around the knee. Patient's right knee significant voluntary guarding noted.  Has decreased in flexion and only extension of 5 degrees.  Tender to palpation diffusely over the knee which makes it more difficult.  It does appear that there is some instability noted.  Limited muscular skeletal  ultrasound was performed and interpreted by Antoine Primas, M  Limited ultrasound of patient's knee does not show any significant effusion of the patellofemoral joint but patient does have some narrowing of the patellofemoral joint.  Patient does have some hypoechoic changes that is fairly significant over the LCL.  Does appear that there is a medial meniscal tear with acute on chronic changes noted with displacement of greater than 52% of the meniscus noted.  Impression: Significant changes of the knee       Assessment and Plan:  Instability of right knee joint No acute Beese concern the  patient does have a medial meniscal tear and questionable ligamentous injury.  Patient denies instability and unfortunately does have well worse than 10 out of 10 pain.  Concern for potential also an occult fracture.  Does have x-rays from outside facility that were independently visualized by me showing no significant acute fracture noted with some mild arthritic changes otherwise.  I would like to get advanced imaging secondary to the instability then follow-up with me again after imaging to discuss further management.       The above documentation has been reviewed and is accurate and complete Judi Saa, DO         Note: This dictation was prepared with Dragon dictation along with smaller phrase technology. Any transcriptional errors that result from this process are unintentional.

## 2022-05-03 ENCOUNTER — Ambulatory Visit: Payer: Commercial Managed Care - PPO | Admitting: Family Medicine

## 2022-05-03 ENCOUNTER — Other Ambulatory Visit: Payer: Self-pay

## 2022-05-03 ENCOUNTER — Encounter: Payer: Self-pay | Admitting: Family Medicine

## 2022-05-03 VITALS — BP 110/70 | HR 62 | Ht 79.0 in

## 2022-05-03 DIAGNOSIS — M25561 Pain in right knee: Secondary | ICD-10-CM

## 2022-05-03 DIAGNOSIS — M25361 Other instability, right knee: Secondary | ICD-10-CM | POA: Diagnosis not present

## 2022-05-03 MED ORDER — TRAMADOL HCL 50 MG PO TABS: 50.0000 mg | ORAL_TABLET | Freq: Three times a day (TID) | ORAL | 0 refills | Status: AC | PRN

## 2022-05-03 NOTE — Patient Instructions (Addendum)
Good to see you Alternate ibuprofen and tylenol  Tramadol sent in  MRI ordered for stat [place] imaging's number is [phone] We will be in touch once we get the results from the MRI

## 2022-05-03 NOTE — Assessment & Plan Note (Addendum)
No acute Beese concern the patient does have a medial meniscal tear and questionable ligamentous injury.  Patient denies instability and unfortunately does have well worse than 10 out of 10 pain.  Concern for potential also an occult fracture.  Does have x-rays from outside facility that were independently visualized by me showing no significant acute fracture noted with some mild arthritic changes otherwise.  I would like to get advanced imaging secondary to the instability then follow-up with me again after imaging to discuss further management.

## 2022-05-05 ENCOUNTER — Ambulatory Visit
Admission: RE | Admit: 2022-05-05 | Discharge: 2022-05-05 | Disposition: A | Discharge status: Home / Self Care | Payer: Commercial Managed Care - PPO | Source: Ambulatory Visit | Attending: Family Medicine | Admitting: Family Medicine

## 2022-05-05 DIAGNOSIS — M25561 Pain in right knee: Secondary | ICD-10-CM

## 2022-05-06 ENCOUNTER — Encounter: Payer: Self-pay | Admitting: Family Medicine

## 2022-05-09 ENCOUNTER — Other Ambulatory Visit: Payer: Self-pay

## 2022-05-09 DIAGNOSIS — S83206A Unspecified tear of unspecified meniscus, current injury, right knee, initial encounter: Secondary | ICD-10-CM

## 2022-09-08 ENCOUNTER — Encounter: Payer: 59 | Admitting: Dermatology

## 2022-09-30 ENCOUNTER — Ambulatory Visit (INDEPENDENT_AMBULATORY_CARE_PROVIDER_SITE_OTHER): Payer: Commercial Managed Care - PPO | Admitting: Family Medicine

## 2022-09-30 ENCOUNTER — Other Ambulatory Visit: Payer: Self-pay

## 2022-09-30 ENCOUNTER — Encounter: Payer: Self-pay | Admitting: Family Medicine

## 2022-09-30 VITALS — BP 122/86 | HR 59 | Ht 79.0 in | Wt 309.0 lb

## 2022-09-30 DIAGNOSIS — M9903 Segmental and somatic dysfunction of lumbar region: Secondary | ICD-10-CM

## 2022-09-30 DIAGNOSIS — M9901 Segmental and somatic dysfunction of cervical region: Secondary | ICD-10-CM | POA: Diagnosis not present

## 2022-09-30 DIAGNOSIS — M79645 Pain in left finger(s): Secondary | ICD-10-CM | POA: Diagnosis not present

## 2022-09-30 DIAGNOSIS — M9904 Segmental and somatic dysfunction of sacral region: Secondary | ICD-10-CM

## 2022-09-30 DIAGNOSIS — M9908 Segmental and somatic dysfunction of rib cage: Secondary | ICD-10-CM | POA: Diagnosis not present

## 2022-09-30 DIAGNOSIS — M65342 Trigger finger, left ring finger: Secondary | ICD-10-CM

## 2022-09-30 DIAGNOSIS — M5030 Other cervical disc degeneration, unspecified cervical region: Secondary | ICD-10-CM

## 2022-09-30 DIAGNOSIS — M6530 Trigger finger, unspecified finger: Secondary | ICD-10-CM

## 2022-09-30 DIAGNOSIS — M9902 Segmental and somatic dysfunction of thoracic region: Secondary | ICD-10-CM

## 2022-09-30 NOTE — Assessment & Plan Note (Signed)
Degenerative disc disease of the cervical spine.  Responded very well to osteopathic manipulation.  Discussed icing regimen and home exercises.  Patient has been to continue to work on core strengthening at this time.  Increase activity slowly.  Follow-up with me again in 6 to 8 weeks.

## 2022-09-30 NOTE — Assessment & Plan Note (Signed)
Trigger finger given.  Discussed icing regimen and home exercises.  Discussed which activities to do and which ones to avoid.  Discussed moist massage

## 2022-09-30 NOTE — Patient Instructions (Addendum)
Injection for trigger finger today Good to see you! See you again in 2-3 months

## 2022-09-30 NOTE — Progress Notes (Signed)
Roger [name] [address] [place] [address] Phone: [phone] Subjective:   INadine Counts, am serving as a scribe for Dr. Antoine Primas.  I'm seeing this patient by the request  of:  Bethanie Dicker, NP  CC: Back and neck pain follow-up  [id number]  Roger [name] is a 52 y.o. male coming in with complaint of back and neck pain Patient states pain in left ring finger for about a month or so. Doing about the same. No other concerns.  Medications patient has been prescribed:   Taking:         Reviewed prior external information including notes and imaging from previsou exam, outside providers and external EMR if available.   As well as notes that were available from care everywhere and other healthcare systems.  Past medical history, social, surgical and family history all reviewed in electronic medical record.  No pertanent information unless stated regarding to the chief complaint.   Past Medical History:  Diagnosis Date   Bradycardia    Chicken pox    COVID-19    01/07/20 with covid pneumonia [facility] s/p MAB infusion 01/13/20 [facility] ED   GERD (gastroesophageal reflux disease)    Hiatal hernia    small noted 2013    Hyperlipidemia    Kidney stone    Premature atrial contractions    Premature ventricular contraction    Vitamin D deficiency    Wrist fracture    right     No Known Allergies   Review of Systems:  No headache, visual changes, nausea, vomiting, diarrhea, constipation, dizziness, abdominal pain, skin rash, fevers, chills, night sweats, weight loss, swollen lymph nodes, body aches, joint swelling, chest pain, shortness of breath, mood changes. POSITIVE muscle aches  Objective  Blood pressure 122/86, pulse (!) 59, height 6\' 7"  (2.007 m), weight (!) 309 lb (140.2 kg), SpO2 96%.   General: No apparent distress alert and oriented x3 mood and affect normal, dressed appropriately.  HEENT: Pupils equal,  extraocular movements intact  Respiratory: Patient's speak in full sentences and does not appear short of breath  Cardiovascular: No lower extremity edema, non tender, no erythema  Low back exam does have some loss of lordosis.  Neck exam does have some tightness noted in the parascapular area bilaterally as well as with the limited sidebending.  Trigger nodule noted of the ring finger at the A2 pulley on the left hand.  Osteopathic findings  C2 flexed rotated and side bent right C6 flexed rotated and side bent left T3 extended rotated and side bent right inhaled rib T9 extended rotated and side bent left L2 flexed rotated and side bent right Sacrum right on right   Procedure: Real-time Ultrasound Guided Injection of left ring flexor tendon sheath Device: GE Logiq Q7 Ultrasound guided injection is preferred based studies that show increased duration, increased effect, greater accuracy, decreased procedural pain, increased response rate, and decreased cost with ultrasound guided versus blind injection.  Verbal informed consent obtained.  Time-out conducted.  Noted no overlying erythema, induration, or other signs of local infection.  Skin prepped in a sterile fashion.  Local anesthesia: Topical Ethyl chloride.  With sterile technique and under real time ultrasound guidance: With a 25-gauge half inch needle injected with 0.5 cc of 0.5% Marcaine and 0.5 cc of Kenalog 40 mg/mL Completed without difficulty  Pain immediately resolved suggesting accurate placement of the medication.  Advised to call if fevers/chills, erythema, induration, drainage, or persistent  bleeding.  Impression: Technically successful ultrasound guided injection.    Assessment and Plan:  Degenerative disc disease, cervical Degenerative disc disease of the cervical spine.  Responded very well to osteopathic manipulation.  Discussed icing regimen and home exercises.  Patient has been to continue to work on core  strengthening at this time.  Increase activity slowly.  Follow-up with me again in 6 to 8 weeks.    Nonallopathic problems  Decision today to treat with OMT was based on Physical Exam  After verbal consent patient was treated with HVLA, ME, FPR techniques in cervical, rib, thoracic, lumbar, and sacral  areas  Patient tolerated the procedure well with improvement in symptoms  Patient given exercises, stretches and lifestyle modifications  See medications in patient instructions if given  Patient will follow up in 4-8 weeks     The above documentation has been reviewed and is accurate and complete Judi Saa, DO         Note: This dictation was prepared with Dragon dictation along with smaller phrase technology. Any transcriptional errors that result from this process are unintentional.

## 2022-10-11 ENCOUNTER — Other Ambulatory Visit: Payer: Self-pay

## 2022-10-11 DIAGNOSIS — K219 Gastro-esophageal reflux disease without esophagitis: Secondary | ICD-10-CM

## 2022-10-11 MED ORDER — PANTOPRAZOLE SODIUM 20 MG PO TBEC
20.0000 mg | DELAYED_RELEASE_TABLET | ORAL | 3 refills | Status: DC
Start: 2022-10-11 — End: 2023-05-01

## 2022-10-11 NOTE — Telephone Encounter (Signed)
E [facility] from [facility] was received requesting a refill on pts pantoprazole. Pts original Rx was sent to [facility] but would now like [facility] sent to [facility]. Refill has been sent

## 2022-10-20 ENCOUNTER — Encounter: Payer: 59 | Admitting: Dermatology

## 2022-10-26 ENCOUNTER — Ambulatory Visit: Payer: Commercial Managed Care - PPO | Admitting: Family Medicine

## 2022-12-29 ENCOUNTER — Ambulatory Visit (INDEPENDENT_AMBULATORY_CARE_PROVIDER_SITE_OTHER): Payer: Commercial Managed Care - PPO | Admitting: Dermatology

## 2022-12-29 DIAGNOSIS — L578 Other skin changes due to chronic exposure to nonionizing radiation: Secondary | ICD-10-CM | POA: Diagnosis not present

## 2022-12-29 DIAGNOSIS — L814 Other melanin hyperpigmentation: Secondary | ICD-10-CM | POA: Diagnosis not present

## 2022-12-29 DIAGNOSIS — L988 Other specified disorders of the skin and subcutaneous tissue: Secondary | ICD-10-CM

## 2022-12-29 DIAGNOSIS — L918 Other hypertrophic disorders of the skin: Secondary | ICD-10-CM

## 2022-12-29 DIAGNOSIS — D229 Melanocytic nevi, unspecified: Secondary | ICD-10-CM

## 2022-12-29 DIAGNOSIS — L820 Inflamed seborrheic keratosis: Secondary | ICD-10-CM | POA: Diagnosis not present

## 2022-12-29 DIAGNOSIS — Z1283 Encounter for screening for malignant neoplasm of skin: Secondary | ICD-10-CM

## 2022-12-29 DIAGNOSIS — W908XXA Exposure to other nonionizing radiation, initial encounter: Secondary | ICD-10-CM

## 2022-12-29 DIAGNOSIS — L570 Actinic keratosis: Secondary | ICD-10-CM | POA: Diagnosis not present

## 2022-12-29 DIAGNOSIS — L821 Other seborrheic keratosis: Secondary | ICD-10-CM

## 2022-12-29 DIAGNOSIS — D1801 Hemangioma of skin and subcutaneous tissue: Secondary | ICD-10-CM

## 2022-12-29 NOTE — Progress Notes (Signed)
Follow-Up Visit   Subjective  Roger [name] is a 52 y.o. male who presents for the following: Skin Cancer Screening and Full Body Skin Exam  The patient presents for Total-Body Skin Exam (TBSE) for skin cancer screening and mole check. The patient has spots, moles and lesions to be evaluated, some may be new or changing and the patient may have concern these could be cancer.   The following portions of the chart were reviewed this encounter and updated as appropriate: medications, allergies, medical history  Review of Systems:  No other skin or systemic complaints except as noted in HPI or Assessment and Plan.  Objective  Well appearing patient in no apparent distress; mood and affect are within normal limits.  A full examination was performed including scalp, head, eyes, ears, nose, lips, neck, chest, axillae, abdomen, back, buttocks, bilateral upper extremities, bilateral lower extremities, hands, feet, fingers, toes, fingernails, and toenails. All findings within normal limits unless otherwise noted below.   Relevant physical exam findings are noted in the Assessment and Plan.  R ant sideburn x 1 Erythematous thin papules/macules with gritty scale.   R axilla x 7, L axilla x 4 (11) Fleshy, skin-colored pedunculated papules.    Right Axilla x 11, L axilla x 5, L clavicle x 1 (17) Erythematous stuck-on, waxy papule or plaque    Assessment & Plan   SKIN CANCER SCREENING PERFORMED TODAY.  ACTINIC DAMAGE - Chronic condition, secondary to cumulative UV/sun exposure - diffuse scaly erythematous macules with underlying dyspigmentation - Recommend daily broad spectrum sunscreen SPF 30+ to sun-exposed areas, reapply every 2 hours as needed.  - Staying in the shade or wearing long sleeves, sun glasses (UVA+UVB protection) and wide brim hats (4-inch brim around the entire circumference of the hat) are also recommended for sun protection.  - Call for new or changing  lesions.  LENTIGINES, SEBORRHEIC KERATOSES, HEMANGIOMAS - Benign normal skin lesions - Benign-appearing - Call for any changes  MELANOCYTIC NEVI - Tan-brown and/or pink-flesh-colored symmetric macules and papules - Benign appearing on exam today - Observation - Call clinic for new or changing moles - Recommend daily use of broad spectrum spf 30+ sunscreen to sun-exposed areas.   AK (actinic keratosis) R ant sideburn x 1  Actinic keratoses are precancerous spots that appear secondary to cumulative UV radiation exposure/sun exposure over time. They are chronic with expected duration over 1 year. A portion of actinic keratoses will progress to squamous cell carcinoma of the skin. It is not possible to reliably predict which spots will progress to skin cancer and so treatment is recommended to prevent development of skin cancer.  Recommend daily broad spectrum sunscreen SPF 30+ to sun-exposed areas, reapply every 2 hours as needed.  Recommend staying in the shade or wearing long sleeves, sun glasses (UVA+UVB protection) and wide brim hats (4-inch brim around the entire circumference of the hat). Call for new or changing lesions.   Destruction of lesion - R ant sideburn x 1 Complexity: simple   Destruction method: cryotherapy   Informed consent: discussed and consent obtained   Timeout:  patient name, date of birth, surgical site, and procedure verified Lesion destroyed using liquid nitrogen: Yes   Region frozen until ice ball extended beyond lesion: Yes   Outcome: patient tolerated procedure well with no complications   Post-procedure details: wound care instructions given    Skin tags, multiple acquired (11) R axilla x 7, L axilla x 4  Destruction of lesion - R  axilla x 7, L axilla x 4 (11) Complexity: simple   Destruction method: cryotherapy   Informed consent: discussed and consent obtained   Timeout:  patient name, date of birth, surgical site, and procedure verified Lesion  destroyed using liquid nitrogen: Yes   Region frozen until ice ball extended beyond lesion: Yes   Outcome: patient tolerated procedure well with no complications   Post-procedure details: wound care instructions given    Inflamed seborrheic keratosis (17) Right Axilla x 11, L axilla x 5, L clavicle x 1  Symptomatic, irritating, patient would like treated.   Destruction of lesion - Right Axilla x 11, L axilla x 5, L clavicle x 1 (17) Complexity: simple   Destruction method: cryotherapy   Informed consent: discussed and consent obtained   Timeout:  patient name, date of birth, surgical site, and procedure verified Lesion destroyed using liquid nitrogen: Yes   Region frozen until ice ball extended beyond lesion: Yes   Outcome: patient tolerated procedure well with no complications   Post-procedure details: wound care instructions given     Piezogenic papules of the feet - are small, soft, skin-colored bumps that can appear on the heels and wrists. They are caused by fat herniating through the dermis. Benign-appearing.  Observation.  Call clinic for new or changing lesions.    Return in about 1 year (around 12/29/2023) for TBSE.  Maylene Roes, CMA, am acting as scribe for Armida Sans, MD .   Documentation: I have reviewed the above documentation for accuracy and completeness, and I agree with the above.  Armida Sans, MD

## 2022-12-29 NOTE — Patient Instructions (Signed)

## 2022-12-29 NOTE — Progress Notes (Signed)
Aleen [facility] [address] [place] [address] Phone: [phone]   Assessment and Plan:     1. Chronic left-sided low back pain with left-sided sciatica 2. Chronic left SI joint pain 3. Somatic dysfunction of lumbar region 4. Somatic dysfunction of pelvic region 5. Somatic dysfunction of sacral region -Chronic with exacerbation, subsequent visit - Recurrent low back pain with left-sided radicular symptoms occurring 10 days ago when patient was standing from a bent over position.  Most consistent with left lumbar muscular strain and left SI joint dysfunction based on HPI and physical exam.  Radicular symptoms were not reproducible on physical exam - Patient elected for IM injection of methylprednisone 80 mg/Toradol 60 mg.  Injection given in clinic today and tolerated well. -Tomorrow, start meloxicam 15 mg daily x2 weeks.  Do not to use additional over-the-counter NSAIDs (ibuprofen, naproxen, Advil, Aleve) while taking prescription NSAIDs.  May use Tylenol [phone] mg 2 to 3 times a day for breakthrough pain. -Restart HEP for low back - Patient has received relief with OMT in the past.  Elects for repeat OMT today.  Tolerated well per note below. - Decision today to treat with OMT was based on Physical Exam  After verbal consent patient was treated with HVLA (high velocity low amplitude), ME (muscle energy), FPR (flex positional release), ST (soft tissue), PC/PD (Pelvic Compression/ Pelvic Decompression) techniques in sacrum, lumbar, and pelvic areas. Patient tolerated the procedure well with improvement in symptoms.  Patient educated on potential side effects of soreness and recommended to rest, hydrate, and use Tylenol as needed for pain control.    Pertinent previous records reviewed include none  Follow Up: Patient has follow-up scheduled with Dr. Katrinka Blazing in 1 month.  Can follow-up sooner if needed for reevaluation   Subjective:    I, Moenique Parris, am serving as a Neurosurgeon for Doctor Richardean Sale  Chief Complaint: low back pain   HPI:  12/30/2022 Patient is a 52 year old male with low back pain. Patient states that he bent down a week and a half ago and had an immediate catch feeling. Pain is intermittent . Interested in OMT. Ibu for the pain and that helps. No numbness or tingling. Pain down the left leg.   Relevant Historical Information: GERD  Additional pertinent review of systems negative.   Current Outpatient Medications:    meloxicam (MOBIC) 15 MG tablet, Take 1 tablet (15 mg total) by mouth daily., Disp: 14 tablet, Rfl: 0   pantoprazole (PROTONIX) 20 MG tablet, Take 1 tablet (20 mg total) by mouth every morning. 30 minutes before food, Disp: 90 tablet, Rfl: 3   tadalafil (CIALIS) 5 MG tablet, Take 1 tablet (5 mg total) by mouth daily as needed for erectile dysfunction., Disp: 30 tablet, Rfl: 5   Cholecalciferol (VITAMIN D3) 1.25 MG (50000 UT) TABS, Take 1 tablet by mouth daily., Disp: , Rfl:    Objective:     Vitals:   12/30/22 1034  Pulse: (!) 54  SpO2: 97%  Weight: (!) 308 lb (139.7 kg)  Height: 6\' 7"  (2.007 m)      Body mass index is 34.7 kg/m.    Physical Exam:    Gen: Appears well, nad, nontoxic and pleasant Psych: Alert and oriented, appropriate mood and affect Neuro: sensation intact, strength is 5/5 in upper and lower extremities, muscle tone wnl Skin: no susupicious lesions or rashes  Back - Normal skin, Spine with normal alignment and no  deformity.   No tenderness to vertebral process palpation.   Bilateral lumbar paraspinous muscles are   tender and without spasm, worse on left TTP left SI joint NTTP gluteal musculature Straight leg raise negative   Gait normal   General: Well-appearing, cooperative, sitting comfortably in no acute distress.   OMT Physical Exam:  ASIS Compression Test: Positive left Sacrum: Positive sphinx, TTP left sacral base Lumbar: TTP  paraspinal, L1-3 RLSR Pelvis: Left anterior innominate   Electronically signed by:  Aleen [facility] 12:13 PM 12/30/22

## 2022-12-30 ENCOUNTER — Ambulatory Visit (INDEPENDENT_AMBULATORY_CARE_PROVIDER_SITE_OTHER): Payer: Commercial Managed Care - PPO | Admitting: Sports Medicine

## 2022-12-30 VITALS — HR 54 | Ht 79.0 in | Wt 308.0 lb

## 2022-12-30 DIAGNOSIS — M9905 Segmental and somatic dysfunction of pelvic region: Secondary | ICD-10-CM

## 2022-12-30 DIAGNOSIS — G8929 Other chronic pain: Secondary | ICD-10-CM

## 2022-12-30 DIAGNOSIS — M9904 Segmental and somatic dysfunction of sacral region: Secondary | ICD-10-CM

## 2022-12-30 DIAGNOSIS — M9903 Segmental and somatic dysfunction of lumbar region: Secondary | ICD-10-CM

## 2022-12-30 DIAGNOSIS — M5442 Lumbago with sciatica, left side: Secondary | ICD-10-CM | POA: Diagnosis not present

## 2022-12-30 DIAGNOSIS — M533 Sacrococcygeal disorders, not elsewhere classified: Secondary | ICD-10-CM

## 2022-12-30 MED ORDER — METHYLPREDNISOLONE ACETATE 80 MG/ML IJ SUSP
80.0000 mg | Freq: Once | INTRAMUSCULAR | Status: AC
  Administered 2022-12-30: 80 mg via INTRAMUSCULAR

## 2022-12-30 MED ORDER — MELOXICAM 15 MG PO TABS: 15.0000 mg | ORAL_TABLET | Freq: Every day | ORAL | 0 refills | Status: DC

## 2022-12-30 MED ORDER — KETOROLAC TROMETHAMINE 60 MG/2ML IM SOLN
60.0000 mg | Freq: Once | INTRAMUSCULAR | Status: AC
  Administered 2022-12-30: 60 mg via INTRAMUSCULAR

## 2022-12-30 NOTE — Patient Instructions (Addendum)
-   Start meloxicam 15 mg daily x2 weeks.  May use remaining NSAID as needed once daily for pain control.  Do not to use additional over-the-counter NSAIDs (ibuprofen, naproxen, Advil, Aleve) while taking prescription NSAIDs.  May use Tylenol [id number] mg 2 to 3 times a day for breakthrough pain.  Continue HEP  Keep follow with Dr. Katrinka Blazing, but can follow up sooner with Dr. Jean Rosenthal if needed

## 2023-01-03 ENCOUNTER — Encounter: Payer: Self-pay | Admitting: Dermatology

## 2023-01-05 ENCOUNTER — Ambulatory Visit: Payer: Commercial Managed Care - PPO | Admitting: Family Medicine

## 2023-01-07 ENCOUNTER — Other Ambulatory Visit: Payer: Self-pay

## 2023-01-07 ENCOUNTER — Emergency Department
Admission: EM | Admit: 2023-01-07 | Discharge: 2023-01-07 | Disposition: A | Discharge status: Home / Self Care | Payer: Commercial Managed Care - PPO | Attending: Student in an Organized Health Care Education/Training Program | Admitting: Student in an Organized Health Care Education/Training Program

## 2023-01-07 DIAGNOSIS — M5416 Radiculopathy, lumbar region: Secondary | ICD-10-CM | POA: Diagnosis not present

## 2023-01-07 DIAGNOSIS — G8929 Other chronic pain: Secondary | ICD-10-CM | POA: Diagnosis not present

## 2023-01-07 DIAGNOSIS — M461 Sacroiliitis, not elsewhere classified: Secondary | ICD-10-CM | POA: Diagnosis not present

## 2023-01-07 DIAGNOSIS — M545 Low back pain, unspecified: Secondary | ICD-10-CM | POA: Diagnosis present

## 2023-01-07 MED ORDER — KETOROLAC TROMETHAMINE 15 MG/ML IJ SOLN
15.0000 mg | Freq: Once | INTRAMUSCULAR | Status: AC
  Administered 2023-01-07: 15 mg via INTRAMUSCULAR
  Filled 2023-01-07: qty 1

## 2023-01-07 MED ORDER — LIDOCAINE 5 % EX PTCH
1.0000 | MEDICATED_PATCH | CUTANEOUS | Status: DC
  Administered 2023-01-07: 1 via TRANSDERMAL
  Filled 2023-01-07: qty 1

## 2023-01-07 MED ORDER — ACETAMINOPHEN 325 MG PO TABS
650.0000 mg | ORAL_TABLET | Freq: Once | ORAL | Status: AC
  Administered 2023-01-07: 650 mg via ORAL
  Filled 2023-01-07: qty 2

## 2023-01-07 NOTE — Discharge Instructions (Addendum)
You may follow-up with orthopedics/neurosurgery for further management.  He may continue the medications that were prescribed for you.  You have also been referred to physical therapy.  Please return to the emergency department for any new, worsening, or changing symptoms or other concerns including weakness in your legs, urinary or stool incontinence or retention, numbness or tingling in your extremities/buttocks/groin, fevers, or any other concerns or change in symptoms.

## 2023-01-07 NOTE — ED Provider Notes (Signed)
[facility] Provider Note    Event Date/Time   First MD Initiated Contact with Patient 01/07/23 [phone]     (approximate)   History   Back Pain   HPI  Roger [name] is a 52 y.o. male with a past medical history of back pain who presents today for evaluation of left-sided low back pain with radiation down his left leg.  He reports that it began on 12/20/2022 when he bent down to look at something and felt a sharp pain when leaning over.  He denies any weakness in that leg.  He has not had any urinary or fecal incontinence or retention.  He went to outside clinic and was given Flexeril, prednisone, and Hydrocodone.  He has not had any fevers or chills.  Denies history of IV drug use.  No abdominal pain.  No hematuria.  No flank pain.  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   FH: prostate cancer 11/18/2019   Testicular cyst 11/18/2019   Pain in both testicles 11/13/2019   FH: colon cancer 06/25/2019   Bradycardia 01/03/2018   Low back pain 12/13/2017   Nonallopathic lesion of sacral region 12/13/2017   Vitamin D deficiency 06/09/2017   HLD (hyperlipidemia) 03/09/2017   Sinus bradycardia 03/09/2017   PVC's (premature ventricular contractions) 03/09/2017   Premature atrial contraction 03/09/2017   Vision problem 03/09/2017   GERD (gastroesophageal reflux disease) 04/16/2015   Obesity (BMI 30-39.9) 04/16/2015   Nonallopathic lesion of thoracic region 04/16/2015   Nonallopathic lesion of lumbosacral region  04/16/2015          Physical Exam   Triage Vital Signs: ED Triage Vitals  Encounter Vitals Group     BP 01/07/23 0759 (!) 145/95     Systolic BP Percentile --      Diastolic BP Percentile --      Pulse Rate 01/07/23 0755 74     Resp 01/07/23 0759 20     Temp 01/07/23 0755 97.8 F (36.6 C)     Temp Source 01/07/23 0755 Oral     SpO2 01/07/23 0755 100 %     Weight 01/07/23 0758 (!) 306 lb 7 oz (139 kg)     Height 01/07/23 0758 6\' 7"  (2.007 m)     Head Circumference --      Peak Flow --      Pain Score 01/07/23 0757 7     Pain Loc --      Pain Education --      Exclude from Growth Chart --     Most recent vital signs: Vitals:   01/07/23 0755 01/07/23 0759  BP:  (!) 145/95  Pulse: 74   Resp:  20  Temp: 97.8 F (36.6 C)   SpO2: 100%     Physical Exam Vitals and nursing note reviewed.  Constitutional:      General: Awake and alert. No acute distress.    Appearance: Normal appearance. The patient is obese.  HENT:     Head: Normocephalic and atraumatic.     Mouth:  Mucous membranes are moist.  Eyes:     General: PERRL. Normal EOMs        Right eye: No discharge.        Left eye: No discharge.     Conjunctiva/sclera: Conjunctivae normal.  Cardiovascular:     Rate and Rhythm: Normal rate and regular rhythm.     Pulses: Normal pulses.  Pulmonary:     Effort: Pulmonary effort is normal. No respiratory distress.     Breath sounds: Normal breath sounds.  Abdominal:     Abdomen is soft. There is no abdominal tenderness. No rebound or guarding. No distention. Musculoskeletal:        General: No swelling. Normal range of motion.     Cervical back: Normal range of motion and neck supple.  Back: No midline tenderness.  Tenderness to palpation to left lumbar paraspinal muscle area and over left SI joint.  No overlying skin changes.  Strength and sensation 5/5 to bilateral lower extremities. Normal great toe extension against resistance. Normal sensation throughout feet.  Normal patellar reflexes. Negative SLR and opposite SLR bilaterally.  Positive figure-of-four test on left Skin:    General: Skin is warm and dry.     Capillary Refill: Capillary refill takes less than 2 seconds.     Findings: No rash.  Neurological:     Mental Status: The patient is awake and alert.      ED Results / Procedures / Treatments   Labs (all labs ordered are listed, but only abnormal results are displayed) Labs Reviewed - No data to display   EKG     RADIOLOGY     PROCEDURES:  Critical Care performed:   Procedures   MEDICATIONS ORDERED IN ED: Medications  lidocaine (LIDODERM) 5 % 1 patch (1 patch Transdermal Patch Applied 01/07/23 0901)  ketorolac (TORADOL) 15 MG/ML injection 15 mg (15 mg Intramuscular Given 01/07/23 0858)  acetaminophen (TYLENOL) tablet 650 mg (650 mg Oral Given 01/07/23 0858)     IMPRESSION / MDM / ASSESSMENT AND PLAN / ED COURSE  I reviewed the triage vital signs and the nursing notes.   Differential diagnosis includes, but is not limited to, sacroiliitis, lumbar radiculopathy, muscle strain, muscle spasm.  I reviewed the patient's chart.  He was seen on 12/30/2022 at [facility] and was diagnosed with left-sided low back pain with left-sided sciatica and left-sided SI joint pain.  He was treated with OMT.  He was also given prescription for meloxicam.  He was subsequently seen at the walk-in clinic and had x-rays which were negative.  This is a 53 year old male with a history of back pain who presents with back pain. 5 out of 5 strength with intact sensation to extensor hallucis dorsiflexion and plantarflexion of bilateral lower extremities with normal patellar reflexes bilaterally. Most likely etiology at this point is muscle strain vs herniated disc vs sacroiliitis. No red flags to indicate patient is at risk for more auspicious process that would require urgent/emergent spinal imaging or subspecialty evaluation at  this time. No major trauma, no midline tenderness, no history or physical exam findings to suggest cauda equina syndrome or spinal cord compression. No focal neurological deficits on exam. No constitutional symptoms or history of immunosuppression or IVDA to suggest potential for epidural abscess. Not anticoagulated, no history of bleeding diastasis to suggest risk for epidural hematoma. No chronic steroid use or advanced age or history of malignancy to suggest proclivity towards pathological fracture.  No abdominal  pain or flank pain to suggest kidney stone, no history of kidney stone.  No fever or dysuria or CVAT to suggest pyelonephritis.  No chest pain, back pain, shortness of breath, neurological deficits, to suggest vascular catastrophe, and pulses are equal in all 4 extremities.  Discussed care instructions and return precautions with patient. Recommended close outpatient follow-up for re-evaluation. Patient agrees with plan of care. Will treat the patient symptomatically as needed for pain control. Will discharge patient to continue to take these medications which she already has at home and return for any worsening or different pain or development of any neurologic symptoms. Educated patient regarding expected time course for back pain to improve and recommended very close outpatient follow-up.  We discussed the option of physical therapy which patient would like to proceed with, and the appropriate referral was placed.  He was also given the information for spine surgery.  He is able to ambulate with a steady gait.  We discussed return precautions.  Patient understands and agrees with plan.  He was discharged in stable condition.   Patient's presentation is most consistent with exacerbation of chronic illness.   FINAL CLINICAL IMPRESSION(S) / ED DIAGNOSES   Final diagnoses:  Lumbar radiculopathy  Chronic left SI joint pain     Rx / DC Orders   ED Discharge Orders          Ordered     Ambulatory referral to Physical Therapy        01/07/23 0838             Note:  This document was prepared using Dragon voice recognition software and may include unintentional dictation errors.   Keturah Shavers 01/07/23 1217    Willy Eddy, MD 01/07/23 [phone]

## 2023-01-07 NOTE — ED Triage Notes (Signed)
Pt to ED for left hip/back radiating down left leg for over a week. States has been to clinic and chiropractor. States has had imaging.

## 2023-02-10 NOTE — Progress Notes (Unsigned)
[name] [address] [place] [address] Phone: [phone] Subjective:   [name], am serving as a scribe for Dr. Antoine Primas.  I'm seeing this patient by the request  of:  Bethanie Dicker, NP  CC: low back pain follow up   [id number]  Roger [name] is a 53 y.o. male coming in with complaint of back and neck pain. OMT on 09/30/2022. Patient states having some knee pain. States that he continues to have lower back pain. Improved 95% from where he was at in December. Pain radiates down L leg. L foot drop. Has been wearing night splint.   Patient was seen in the emergency department secondary to severe back pain on December 14. Medications patient has been prescribed:   Taking:         Reviewed prior external information including notes and imaging from previsou exam, outside providers and external EMR if available.   As well as notes that were available from care everywhere and other healthcare systems.  Past medical history, social, surgical and family history all reviewed in electronic medical record.  No pertanent information unless stated regarding to the chief complaint.   Past Medical History:  Diagnosis Date   Bradycardia    Chicken pox    COVID-19    01/07/20 with covid pneumonia [facility] s/p MAB infusion 01/13/20 [facility] ED   GERD (gastroesophageal reflux disease)    Hiatal hernia    small noted 2013    Hyperlipidemia    Kidney stone    Premature atrial contractions    Premature ventricular contraction    Vitamin D deficiency    Wrist fracture    right     No Known Allergies   Review of Systems:  No headache, visual changes, nausea, vomiting, diarrhea, constipation, dizziness, abdominal pain, skin rash, fevers, chills, night sweats, weight loss, swollen lymph nodes, body aches, joint swelling, chest pain, shortness of breath, mood changes. POSITIVE muscle aches  Objective  Blood pressure 108/76, pulse  83, height 6\' 7"  (2.007 m), weight (!) 308 lb (139.7 kg), SpO2 96%.   General: No apparent distress alert and oriented x3 mood and affect normal, dressed appropriately.  HEENT: Pupils equal, extraocular movements intact  Respiratory: Patient's speak in full sentences and does not appear short of breath  Cardiovascular: No lower extremity edema, non tender, no erythema  MSK:  Back low back does have loss of lordosis  Tightness with faber.  Weakness noted with 3 out of 5 strength of the left dorsiflexion of the foot.  Osteopathic findings  T7 extended rotated and side bent left L2 flexed rotated and side bent right L5 flexed rotated and side bent left Sacrum right on right     Assessment and Plan:  Lumbar radiculopathy Patient is now nearly 6 weeks out from when patient started having some difficulty.  Patient still has some weakness noted in dorsiflexion.  Concerned about this and started on gabapentin.  Warned of potential side effects.  Discussed icing regimen.  Need to continue to monitor the weakness in the leg.  Attempted osteopathic manipulation.  Follow-up again in 6 to 8 weeks otherwise    Nonallopathic problems  Decision today to treat with OMT was based on Physical Exam  After verbal consent patient was treated with HVLA, ME, FPR techniques in  thoracic, lumbar, and sacral  areas  Patient tolerated the procedure well with improvement in symptoms  Patient given exercises, stretches and lifestyle  modifications  See medications in patient instructions if given  Patient will follow up in 4-8 weeks    The above documentation has been reviewed and is accurate and complete Roger Saa, DO          Note: This dictation was prepared with Dragon dictation along with smaller phrase technology. Any transcriptional errors that result from this process are unintentional.

## 2023-02-14 ENCOUNTER — Other Ambulatory Visit: Payer: Self-pay

## 2023-02-14 ENCOUNTER — Ambulatory Visit (INDEPENDENT_AMBULATORY_CARE_PROVIDER_SITE_OTHER): Payer: Commercial Managed Care - PPO | Admitting: Family Medicine

## 2023-02-14 ENCOUNTER — Encounter: Payer: Self-pay | Admitting: Family Medicine

## 2023-02-14 ENCOUNTER — Ambulatory Visit (INDEPENDENT_AMBULATORY_CARE_PROVIDER_SITE_OTHER): Payer: Commercial Managed Care - PPO

## 2023-02-14 VITALS — BP 108/76 | HR 83 | Ht 79.0 in | Wt 308.0 lb

## 2023-02-14 DIAGNOSIS — M545 Low back pain, unspecified: Secondary | ICD-10-CM

## 2023-02-14 DIAGNOSIS — M9902 Segmental and somatic dysfunction of thoracic region: Secondary | ICD-10-CM | POA: Diagnosis not present

## 2023-02-14 DIAGNOSIS — M9901 Segmental and somatic dysfunction of cervical region: Secondary | ICD-10-CM | POA: Diagnosis not present

## 2023-02-14 DIAGNOSIS — M9908 Segmental and somatic dysfunction of rib cage: Secondary | ICD-10-CM

## 2023-02-14 DIAGNOSIS — M9904 Segmental and somatic dysfunction of sacral region: Secondary | ICD-10-CM | POA: Diagnosis not present

## 2023-02-14 DIAGNOSIS — M25561 Pain in right knee: Secondary | ICD-10-CM | POA: Diagnosis not present

## 2023-02-14 DIAGNOSIS — M5416 Radiculopathy, lumbar region: Secondary | ICD-10-CM | POA: Diagnosis not present

## 2023-02-14 DIAGNOSIS — M9903 Segmental and somatic dysfunction of lumbar region: Secondary | ICD-10-CM

## 2023-02-14 MED ORDER — GABAPENTIN 100 MG PO CAPS: 200.0000 mg | ORAL_CAPSULE | Freq: Every day | ORAL | 0 refills | Status: DC

## 2023-02-14 NOTE — Assessment & Plan Note (Signed)
Patient is now nearly 6 weeks out from when patient started having some difficulty.  Patient still has some weakness noted in dorsiflexion.  Concerned about this and started on gabapentin.  Warned of potential side effects.  Discussed icing regimen.  Need to continue to monitor the weakness in the leg.  Attempted osteopathic manipulation.  Follow-up again in 6 to 8 weeks otherwise

## 2023-02-14 NOTE — Patient Instructions (Signed)
Gabapentin 200mg  at bedtime Xray today If weakness gets worse send a message See me in 5-6 weeks

## 2023-02-17 ENCOUNTER — Encounter: Payer: Self-pay | Admitting: Family Medicine

## 2023-03-14 NOTE — Progress Notes (Signed)
 [name] [address] [place] [address] Phone: [phone] Subjective:   Roger [name], am serving as a scribe for Dr. Antoine Primas.  I'm seeing this patient by the request  of:  Bethanie Dicker, NP  CC: Knee pain, back pain  [id number]  Roger [name] is a 53 y.o. male coming in with complaint of back and neck pain. OMT 02/14/2023. Patient states that is back pain is better but still no 100%. Will catch from time to time.   Also c/o R knee swelling intermittently.   Medications patient has been prescribed: Gabapentin  Taking:         Reviewed prior external information including notes and imaging from previsou exam, outside providers and external EMR if available.   As well as notes that were available from care everywhere and other healthcare systems.  Past medical history, social, surgical and family history all reviewed in electronic medical record.  No pertanent information unless stated regarding to the chief complaint.   Past Medical History:  Diagnosis Date   Bradycardia    Chicken pox    COVID-19    01/07/20 with covid pneumonia [facility] s/p [facility]AB infusion 01/13/20 [facility] ED   GERD (gastroesophageal reflux disease)    Hiatal hernia    small noted 2013    Hyperlipidemia    Kidney stone    Premature atrial contractions    Premature ventricular contraction    Vitamin D deficiency    Wrist fracture    right     No Known Allergies   Review of Systems:  No headache, visual changes, nausea, vomiting, diarrhea, constipation, dizziness, abdominal pain, skin rash, fevers, chills, night sweats, weight loss, swollen lymph nodes, body aches, joint swelling, chest pain, shortness of breath, mood changes. POSITIVE muscle aches  Objective  Blood pressure 124/86, pulse 63, height 6\' 7"  (2.007 m), weight (!) 312 lb (141.5 kg), SpO2 95%.   General: No apparent distress alert and oriented x3 mood and affect normal, dressed  appropriately.  HEENT: Pupils equal, extraocular movements intact  Respiratory: Patient's speak in full sentences and does not appear short of breath  Cardiovascular: No lower extremity edema, non tender, no erythema  Gait MSK:  Back patient back does have some loss lordosis noted.  Some poor core strength noted.  Some tightness noted on the right parascapular area in the paraspinal musculature as well.  Right knee does have some crepitus noted.  Seems to be the patellofemoral, lateral tracking noted.  After informed written and verbal consent, patient was seated on exam table. Right knee was prepped with alcohol swab and utilizing anterolateral approach, patient's right knee space was injected with 4:1  marcaine 0.5%: Kenalog 40mg /dL. Patient tolerated the procedure well without immediate complications.  Osteopathic findings  C3 flexed rotated and side bent right C6 flexed rotated and side bent left T3 extended rotated and side bent right inhaled rib T9 extended rotated and side bent left L2 flexed rotated and side bent right Sacrum right on right       Assessment and Plan:  Patellofemoral arthritis of right knee Patient given injection and tolerated the procedure well, discussed icing regimen of home exercises, discussed which activities to do and which ones to avoid.  Increase activity slowly.  Discussed icing regimen and home exercises.  Follow-up again in 6 to 8 weeks.    Nonallopathic problems  Decision today to treat with OMT was based on Physical Exam  After  verbal consent patient was treated with HVLA, ME, FPR techniques in cervical, rib, thoracic, lumbar, and sacral  areas  Patient tolerated the procedure well with improvement in symptoms  Patient given exercises, stretches and lifestyle modifications  See medications in patient instructions if given  Patient will follow up in 4-8 weeks      The above documentation has been reviewed and is accurate and  complete Judi Saa, DO        Note: This dictation was prepared with Dragon dictation along with smaller phrase technology. Any transcriptional errors that result from this process are unintentional.

## 2023-03-20 ENCOUNTER — Other Ambulatory Visit: Payer: Self-pay

## 2023-03-20 ENCOUNTER — Encounter: Payer: Self-pay | Admitting: Family Medicine

## 2023-03-20 ENCOUNTER — Ambulatory Visit (INDEPENDENT_AMBULATORY_CARE_PROVIDER_SITE_OTHER): Payer: Commercial Managed Care - PPO | Admitting: Family Medicine

## 2023-03-20 VITALS — BP 124/86 | HR 63 | Ht 79.0 in | Wt 312.0 lb

## 2023-03-20 DIAGNOSIS — M9903 Segmental and somatic dysfunction of lumbar region: Secondary | ICD-10-CM | POA: Diagnosis not present

## 2023-03-20 DIAGNOSIS — M9908 Segmental and somatic dysfunction of rib cage: Secondary | ICD-10-CM | POA: Diagnosis not present

## 2023-03-20 DIAGNOSIS — G8929 Other chronic pain: Secondary | ICD-10-CM

## 2023-03-20 DIAGNOSIS — M5416 Radiculopathy, lumbar region: Secondary | ICD-10-CM | POA: Diagnosis not present

## 2023-03-20 DIAGNOSIS — M1711 Unilateral primary osteoarthritis, right knee: Secondary | ICD-10-CM | POA: Insufficient documentation

## 2023-03-20 DIAGNOSIS — M9902 Segmental and somatic dysfunction of thoracic region: Secondary | ICD-10-CM | POA: Diagnosis not present

## 2023-03-20 DIAGNOSIS — M9904 Segmental and somatic dysfunction of sacral region: Secondary | ICD-10-CM

## 2023-03-20 DIAGNOSIS — M9901 Segmental and somatic dysfunction of cervical region: Secondary | ICD-10-CM

## 2023-03-20 NOTE — Patient Instructions (Signed)
 Good to see you Visco and PRP read about it See me again in 2 months

## 2023-03-20 NOTE — Assessment & Plan Note (Addendum)
 Patient given injection and tolerated the procedure well, discussed icing regimen of home exercises, discussed which activities to do and which ones to avoid.  Increase activity slowly.  Discussed icing regimen and home exercises.  Follow-up again in 6 to 8 weeks.  Be a candidate for viscosupplementation and we will see if we can get approval.  Likely some postsurgical changes noted of the patellofemoral joint based on patient's anterior knee pain

## 2023-03-20 NOTE — Assessment & Plan Note (Signed)
 Discussed radiculopathy.  Discussed which activities to do and which ones to avoid.  Increase activity slowly.  Discussed which activities to do and which ones to avoid.  Follow-up again in 6 to 8 weeks.

## 2023-03-21 ENCOUNTER — Ambulatory Visit: Payer: Commercial Managed Care - PPO | Admitting: Family Medicine

## 2023-03-21 ENCOUNTER — Telehealth: Payer: Self-pay

## 2023-03-21 NOTE — Telephone Encounter (Signed)
**  Patient scheduled 05/18/23  [facility] authorized Right knee Specialist visit, Durolane, and Procedure codes [id number] covered at 80% of the allowable amount Deductible must be met before coverage applies Once OOP is met coverage goes to 100% No PA or referral needed Medical notes may be requested at the time of claims processing Deductible $3300 has met $ 478.01 OOP max $6750 has met $ 478.01 Document scanned

## 2023-03-21 NOTE — Telephone Encounter (Signed)
 noted

## 2023-04-10 ENCOUNTER — Telehealth: Payer: Self-pay

## 2023-04-10 NOTE — Telephone Encounter (Signed)
 Copied from CRM [id number]. Topic: Clinical - Request for Lab/Test Order >> Apr 10, 2023  1:23 PM Elizebeth Brooking wrote: Reason for CRM: Patient called in wanting to know if her needs labs for him annual physical on 03/19 , stated if so he would like to do them tomorrow, is requesting a callback regarding this

## 2023-04-11 ENCOUNTER — Encounter: Payer: Self-pay | Admitting: Family Medicine

## 2023-04-11 ENCOUNTER — Other Ambulatory Visit: Payer: Self-pay | Admitting: Nurse Practitioner

## 2023-04-11 DIAGNOSIS — E785 Hyperlipidemia, unspecified: Secondary | ICD-10-CM

## 2023-04-11 DIAGNOSIS — Z8042 Family history of malignant neoplasm of prostate: Secondary | ICD-10-CM

## 2023-04-11 DIAGNOSIS — E669 Obesity, unspecified: Secondary | ICD-10-CM

## 2023-04-11 DIAGNOSIS — Z125 Encounter for screening for malignant neoplasm of prostate: Secondary | ICD-10-CM

## 2023-04-11 DIAGNOSIS — Z1329 Encounter for screening for other suspected endocrine disorder: Secondary | ICD-10-CM

## 2023-04-11 DIAGNOSIS — R001 Bradycardia, unspecified: Secondary | ICD-10-CM

## 2023-04-11 DIAGNOSIS — E559 Vitamin D deficiency, unspecified: Secondary | ICD-10-CM

## 2023-04-11 NOTE — Telephone Encounter (Signed)
 Tried to call pt to see if he can come in today for labs art 11:30 vm box full unable to leave a vm.

## 2023-04-12 ENCOUNTER — Encounter: Payer: Self-pay | Admitting: Nurse Practitioner

## 2023-04-12 ENCOUNTER — Ambulatory Visit: Payer: Commercial Managed Care - PPO | Admitting: Nurse Practitioner

## 2023-04-12 VITALS — BP 118/80 | HR 74 | Temp 98.2°F | Ht 79.0 in | Wt 311.2 lb

## 2023-04-12 DIAGNOSIS — S60419A Abrasion of unspecified finger, initial encounter: Secondary | ICD-10-CM | POA: Diagnosis not present

## 2023-04-12 DIAGNOSIS — E669 Obesity, unspecified: Secondary | ICD-10-CM | POA: Diagnosis not present

## 2023-04-12 DIAGNOSIS — M170 Bilateral primary osteoarthritis of knee: Secondary | ICD-10-CM | POA: Diagnosis not present

## 2023-04-12 DIAGNOSIS — Z0001 Encounter for general adult medical examination with abnormal findings: Secondary | ICD-10-CM | POA: Diagnosis not present

## 2023-04-12 DIAGNOSIS — E785 Hyperlipidemia, unspecified: Secondary | ICD-10-CM

## 2023-04-12 DIAGNOSIS — M5416 Radiculopathy, lumbar region: Secondary | ICD-10-CM

## 2023-04-12 DIAGNOSIS — N529 Male erectile dysfunction, unspecified: Secondary | ICD-10-CM

## 2023-04-12 MED ORDER — TADALAFIL 5 MG PO TABS: 5.0000 mg | ORAL_TABLET | Freq: Every day | ORAL | 3 refills | Status: DC | PRN

## 2023-04-12 NOTE — Progress Notes (Signed)
 Roger Dicker, NP-C Phone: [phone]  Roger BISCHOF is a 53 y.o. male who presents today for annual exam.   Discussed the use of AI scribe software for clinical note transcription with the patient, who gave verbal consent to proceed.  History of Present Illness   Roger [name] is a 53 year old male who presents for annual exam with concerns about weight management.  He is concerned about weight management despite efforts to improve his diet and exercise routine. Since the beginning of the year, he has eliminated sodas, sweet tea, and sweets from his diet and started using a home gym. Despite these changes, he has only lost a few pounds. He aims to reach a goal weight of 275 pounds, requiring a loss of about 35 pounds from his current weight. He is worried about his testosterone levels as he ages. No mood issues such as anxiety or depression. He sleeps well.  He has a history of knee pain, particularly following a torn meniscus in one knee, which was treated by trimming the damaged part. He is scheduled for gel injections in both knees on April 7th to address potential bone-on-bone contact. He believes that weight loss may help alleviate some of the knee pain. No chest pain or shortness of breath.  He experiences occasional sciatica, primarily affecting his left leg, with symptoms of foot drop. He is currently taking gabapentin at night to help with nerve function. No bowel or bladder incontinence.  He sustained a minor injury to his finger while playing with his dog, resulting in a small cut. The finger is slightly swollen and difficult to bend, but there is no significant tenderness or signs of infection.  He had a sinus infection a few weeks ago, which was treated with amoxicillin and cough syrup, resolving the symptoms. No headaches, dizziness, or trouble swallowing.    He has a family history of both prostate and colon cancer occurring in his father. He is up to date on his colonoscopy. His  tetanus vaccine is up to date. He politely declines flu, COVID and shingles vaccines. He does not smoke or use illicit drugs and rarely consumes alcohol. He maintains regular dental and eye care.      Social History   Tobacco Use  Smoking Status Former   Current packs/day: 0.00   Average packs/day: 1 pack/day for 12.0 years (12.0 ttl pk-yrs)   Types: Cigarettes   Start date: 21   Quit date: 2003   Years since quitting: 22.2  Smokeless Tobacco Former   Types: Chew  Tobacco Comments   Quit 20 years ago.    Current Outpatient Medications on File Prior to Visit  Medication Sig Dispense Refill   gabapentin (NEURONTIN) 100 MG capsule Take 2 capsules (200 mg total) by mouth at bedtime. 180 capsule 0   pantoprazole (PROTONIX) 20 MG tablet Take 1 tablet (20 mg total) by mouth every morning. 30 minutes before food 90 tablet 3   No current facility-administered medications on file prior to visit.     ROS see history of present illness  Objective  Physical Exam Vitals:   04/12/23 1437  BP: 118/80  Pulse: 74  Temp: 98.2 F (36.8 C)  SpO2: 97%    BP Readings from Last 3 Encounters:  04/12/23 118/80  03/20/23 124/86  02/14/23 108/76   Wt Readings from Last 3 Encounters:  04/12/23 (!) 311 lb 3.2 oz (141.2 kg)  03/20/23 (!) 312 lb (141.5 kg)  02/14/23 (!) 308 lb (  139.7 kg)    Physical Exam Constitutional:      General: He is not in acute distress.    Appearance: Normal appearance. He is obese.  HENT:     Head: Normocephalic.     Right Ear: Tympanic membrane normal.     Left Ear: Tympanic membrane normal.     Nose: Nose normal.     Mouth/Throat:     Mouth: Mucous membranes are moist.     Pharynx: Oropharynx is clear.  Eyes:     Conjunctiva/sclera: Conjunctivae normal.     Pupils: Pupils are equal, round, and reactive to light.  Neck:     Thyroid: No thyromegaly.  Cardiovascular:     Rate and Rhythm: Normal rate and regular rhythm.     Heart sounds: Normal heart  sounds.  Pulmonary:     Effort: Pulmonary effort is normal.     Breath sounds: Normal breath sounds.  Abdominal:     General: Abdomen is flat. Bowel sounds are normal.     Palpations: Abdomen is soft. There is no mass.     Tenderness: There is no abdominal tenderness.  Musculoskeletal:        General: Normal range of motion.  Lymphadenopathy:     Cervical: No cervical adenopathy.  Skin:    General: Skin is warm and dry.     Findings: Abrasion (right ring finger, medial side with small abrasion. Mild erythema, no drainage, warmth or tenderness) present. No rash.  Neurological:     General: No focal deficit present.     Mental Status: He is alert.  Psychiatric:        Mood and Affect: Mood normal.        Behavior: Behavior normal.     Assessment/Plan: Please see individual problem list.  Encounter for routine adult health examination with abnormal findings Assessment & Plan: Physical exam complete. Lab work previously ordered, he will return when fasting to complete this. We will check PSA in lab work. Colonoscopy is up to date, and tetanus is current until 2028. He declined the shingles, flu and COVID vaccines. Continue routine dental and eye exams. Encouraged to continue working on healthy diet and regular exercise. Return to care in one year, sooner as needed.    Abrasion of finger of right hand, initial encounter Assessment & Plan: The finger is slightly swollen and difficult to bend but no signs of infection are present. No antibiotics are needed unless symptoms worsen. Monitor for signs of infection such as increased redness, heat, streaks, or drainage. Advised ice as needed and Tylenol/Ibuprofen for pain.    Lumbar radiculopathy Assessment & Plan: The condition is mostly resolved with occasional flare-ups. Foot drop in the left leg is likely due to nerve issues. He is on gabapentin for nerve pain management. Follow up with sports med as scheduled.    Osteoarthritis of  both knees, unspecified osteoarthritis type Assessment & Plan: A torn meniscus has been treated, but possible bone-on-bone contact may be causing pain. Scheduled gel injections on April 7th may provide relief. Weight loss is recommended to reduce stress on the knees. Follow up with sports med as scheduled.    Obesity (BMI 30-39.9) Assessment & Plan: Emphasized the importance of diet and exercise with a focus on caloric deficit. Advised reducing carbohydrate intake and increasing protein. Explained that weight loss should be a gradual lifestyle change. Discussed cholesterol and diabetes risk due to obesity. Will check testosterone levels and A1c.   Orders: -  Testosterone; Future  Hyperlipidemia, unspecified hyperlipidemia type Assessment & Plan: Managed with diet control. Working on trying to lose weight. He will return for fasting lipid panel. The 10-year ASCVD risk score (Arnett DK, et al., 2019) is: 4.4%.    Erectile dysfunction, unspecified erectile dysfunction type Assessment & Plan: Well managed with Cialis 5 mg as needed. Refills provided. We will check testosterone levels.   Orders: -     Testosterone; Future -     Tadalafil; Take 1 tablet (5 mg total) by mouth daily as needed for erectile dysfunction.  Dispense: 90 tablet; Refill: 3    Return for fasting labs then in one year for annual exam, sooner as needed.   Roger Dicker, NP-C [facility]

## 2023-04-12 NOTE — Assessment & Plan Note (Signed)
 The finger is slightly swollen and difficult to bend but no signs of infection are present. No antibiotics are needed unless symptoms worsen. Monitor for signs of infection such as increased redness, heat, streaks, or drainage. Advised ice as needed and Tylenol/Ibuprofen for pain.

## 2023-04-12 NOTE — Assessment & Plan Note (Addendum)
 Emphasized the importance of diet and exercise with a focus on caloric deficit. Advised reducing carbohydrate intake and increasing protein. Explained that weight loss should be a gradual lifestyle change. Discussed cholesterol and diabetes risk due to obesity. Will check testosterone levels and A1c.

## 2023-04-12 NOTE — Assessment & Plan Note (Signed)
 Managed with diet control. Working on trying to lose weight. He will return for fasting lipid panel. The 10-year ASCVD risk score (Arnett DK, et al., 2019) is: 4.4%.

## 2023-04-12 NOTE — Assessment & Plan Note (Signed)
 A torn meniscus has been treated, but possible bone-on-bone contact may be causing pain. Scheduled gel injections on April 7th may provide relief. Weight loss is recommended to reduce stress on the knees. Follow up with sports med as scheduled.

## 2023-04-12 NOTE — Assessment & Plan Note (Addendum)
 Physical exam complete. Lab work previously ordered, he will return when fasting to complete this. We will check PSA in lab work. Colonoscopy is up to date, and tetanus is current until 2028. He declined the shingles, flu and COVID vaccines. Continue routine dental and eye exams. Encouraged to continue working on healthy diet and regular exercise. Return to care in one year, sooner as needed.

## 2023-04-12 NOTE — Telephone Encounter (Signed)
 Patient is approved for left knee now as well.

## 2023-04-12 NOTE — Assessment & Plan Note (Signed)
 The condition is mostly resolved with occasional flare-ups. Foot drop in the left leg is likely due to nerve issues. He is on gabapentin for nerve pain management. Follow up with sports med as scheduled.

## 2023-04-12 NOTE — Assessment & Plan Note (Signed)
 Well managed with Cialis 5 mg as needed. Refills provided. We will check testosterone levels.

## 2023-04-13 NOTE — Telephone Encounter (Signed)
 Noted.

## 2023-04-18 ENCOUNTER — Encounter: Payer: Self-pay | Admitting: Nurse Practitioner

## 2023-04-18 ENCOUNTER — Other Ambulatory Visit (INDEPENDENT_AMBULATORY_CARE_PROVIDER_SITE_OTHER)

## 2023-04-18 DIAGNOSIS — E785 Hyperlipidemia, unspecified: Secondary | ICD-10-CM | POA: Diagnosis not present

## 2023-04-18 DIAGNOSIS — R001 Bradycardia, unspecified: Secondary | ICD-10-CM | POA: Diagnosis not present

## 2023-04-18 DIAGNOSIS — E669 Obesity, unspecified: Secondary | ICD-10-CM | POA: Diagnosis not present

## 2023-04-18 DIAGNOSIS — Z1329 Encounter for screening for other suspected endocrine disorder: Secondary | ICD-10-CM

## 2023-04-18 DIAGNOSIS — Z8042 Family history of malignant neoplasm of prostate: Secondary | ICD-10-CM | POA: Diagnosis not present

## 2023-04-18 DIAGNOSIS — N529 Male erectile dysfunction, unspecified: Secondary | ICD-10-CM

## 2023-04-18 DIAGNOSIS — Z125 Encounter for screening for malignant neoplasm of prostate: Secondary | ICD-10-CM | POA: Diagnosis not present

## 2023-04-18 DIAGNOSIS — E559 Vitamin D deficiency, unspecified: Secondary | ICD-10-CM

## 2023-04-18 LAB — CBC WITH DIFFERENTIAL/PLATELET
Basophils Absolute: 0 10*3/uL (ref 0.0–0.1)
Basophils Relative: 0.6 % (ref 0.0–3.0)
Eosinophils Absolute: 0.2 10*3/uL (ref 0.0–0.7)
Eosinophils Relative: 3.4 % (ref 0.0–5.0)
HCT: 46.7 % (ref 39.0–52.0)
Hemoglobin: 15.6 g/dL (ref 13.0–17.0)
Lymphocytes Relative: 28.4 % (ref 12.0–46.0)
Lymphs Abs: 1.7 10*3/uL (ref 0.7–4.0)
MCHC: 33.5 g/dL (ref 30.0–36.0)
MCV: 89.8 fl (ref 78.0–100.0)
Monocytes Absolute: 0.6 10*3/uL (ref 0.1–1.0)
Monocytes Relative: 10.3 % (ref 3.0–12.0)
Neutro Abs: 3.4 10*3/uL (ref 1.4–7.7)
Neutrophils Relative %: 57.3 % (ref 43.0–77.0)
Platelets: 175 10*3/uL (ref 150.0–400.0)
RBC: 5.2 Mil/uL (ref 4.22–5.81)
RDW: 13.9 % (ref 11.5–15.5)
WBC: 5.9 10*3/uL (ref 4.0–10.5)

## 2023-04-18 LAB — COMPREHENSIVE METABOLIC PANEL
ALT: 27 U/L (ref 0–53)
AST: 17 U/L (ref 0–37)
Albumin: 4.7 g/dL (ref 3.5–5.2)
Alkaline Phosphatase: 53 U/L (ref 39–117)
BUN: 12 mg/dL (ref 6–23)
CO2: 25 meq/L (ref 19–32)
Calcium: 9.2 mg/dL (ref 8.4–10.5)
Chloride: 107 meq/L (ref 96–112)
Creatinine, Ser: 0.93 mg/dL (ref 0.40–1.50)
GFR: 94.2 mL/min (ref >60.00)
Glucose, Bld: 97 mg/dL (ref 70–99)
Potassium: 4.1 meq/L (ref 3.5–5.1)
Sodium: 140 meq/L (ref 135–145)
Total Bilirubin: 0.5 mg/dL (ref 0.2–1.2)
Total Protein: 6.8 g/dL (ref 6.0–8.3)

## 2023-04-18 LAB — LIPID PANEL
Cholesterol: 169 mg/dL (ref 0–200)
HDL: 36.7 mg/dL — ABNORMAL LOW (ref >39.00)
LDL Cholesterol: 117 mg/dL — ABNORMAL HIGH (ref 0–99)
NonHDL: 132.18
Total CHOL/HDL Ratio: 5
Triglycerides: 78 mg/dL (ref 0.0–149.0)
VLDL: 15.6 mg/dL (ref 0.0–40.0)

## 2023-04-18 LAB — TSH: TSH: 2.15 u[IU]/mL (ref 0.35–5.50)

## 2023-04-18 LAB — TESTOSTERONE: Testosterone: 310.68 ng/dL (ref 300.00–890.00)

## 2023-04-18 LAB — PSA: PSA: 1.82 ng/mL (ref 0.10–4.00)

## 2023-04-18 LAB — VITAMIN D 25 HYDROXY (VIT D DEFICIENCY, FRACTURES): VITD: 31.14 ng/mL (ref 30.00–100.00)

## 2023-04-18 LAB — HEMOGLOBIN A1C: Hgb A1c MFr Bld: 5.4 % (ref 4.6–6.5)

## 2023-04-24 NOTE — Progress Notes (Signed)
 [name] [address] [place] [address] Phone: [phone] Subjective:   Roger [name] am a scribe for Dr. Katrinka Blazing.   I'm seeing this patient by the request  of:  Bethanie Dicker, NP  CC: Knee pain  [id number]  Roger [name] is a 53 y.o. male coming in with complaint of  seen for knee pain. Patient states wants knee injections today. Back isn't too good but neck is ok. Leaving for [place] on Thursday.   Medications patient has been prescribed:   Taking:         Reviewed prior external information including notes and imaging from previsou exam, outside providers and external EMR if available.   As well as notes that were available from care everywhere and other healthcare systems.  Past medical history, social, surgical and family history all reviewed in electronic medical record.  No pertanent information unless stated regarding to the chief complaint.   Past Medical History:  Diagnosis Date   Bradycardia    Chicken pox    COVID-19    01/07/20 with covid pneumonia [facility] diagnostics s/p MAB infusion 01/13/20 [facility] ED   GERD (gastroesophageal reflux disease)    Hiatal hernia    small noted 2013    Hyperlipidemia    Kidney stone    Premature atrial contractions    Premature ventricular contraction    Vitamin D deficiency    Wrist fracture    right     No Known Allergies   Review of Systems:  No headache, visual changes, nausea, vomiting, diarrhea, constipation, dizziness, abdominal pain, skin rash, fevers, chills, night sweats, weight loss, swollen lymph nodes, body aches, joint swelling, chest pain, shortness of breath, mood changes. POSITIVE muscle aches  Objective  Blood pressure 122/70, pulse 77, height 6\' 7"  (2.007 m), weight (!) 311 lb (141.1 kg), SpO2 96%.   General: No apparent distress alert and oriented x3 mood and affect normal, dressed appropriately.  HEENT: Pupils equal, extraocular movements intact   Respiratory: Patient's speak in full sentences and does not appear short of breath  Cardiovascular: No lower extremity edema, non tender, no erythema  Gait antalgic.  Patient does have significant arthritic changes noted of the knees mostly of the medial compartment bilaterally. MSK:  Back does have some loss lordosis.  Patient does note Some discomfort noted as well.  Osteopathic findings C6 flexed rotated and side bent right T3 extended rotated and side bent right inhaled rib T9 extended rotated and side bent left L5 flexed rotated and side bent right Sacrum right on right   After informed written and verbal consent, patient was seated on exam table. Right knee was prepped with alcohol swab and utilizing anterolateral approach, patient's right knee space was injected with 60 mg per 3 mL of Durolane (sodium hyaluronate) in a prefilled syringe was injected easily into the knee through a 22-gauge needle..Patient tolerated the procedure well without immediate complications.   After informed written and verbal consent, patient was seated on exam table. Left knee was prepped with alcohol swab and utilizing anterolateral approach, patient's left knee space was injected with 60 mg per 3 mL of Durolane (sodium hyaluronate) in a prefilled syringe was injected easily into the knee through a 22-gauge needle..Patient tolerated the procedure well without immediate complications.   Assessment and Plan:  Osteoarthritis of both knees Chronic problem with worsening symptoms.  Will try viscosupplementation and see how patient responds.  Discussed icing regimen and home exercises, which  activities to do and which ones to avoid.  Increase activity slowly otherwise.  Discussed icing regimen.  Follow-up again in 6 to 8 weeks otherwise.    Nonallopathic problems  Decision today to treat with OMT was based on Physical Exam  After verbal consent patient was treated with HVLA, ME, FPR techniques in cervical,  rib, thoracic, lumbar, and sacral  areas  Patient tolerated the procedure well with improvement in symptoms  Patient given exercises, stretches and lifestyle modifications  See medications in patient instructions if given  Patient will follow up in 4-8 weeks    The above documentation has been reviewed and is accurate and complete Judi Saa, DO          Note: This dictation was prepared with Dragon dictation along with smaller phrase technology. Any transcriptional errors that result from this process are unintentional.

## 2023-04-29 ENCOUNTER — Other Ambulatory Visit: Payer: Self-pay | Admitting: Nurse Practitioner

## 2023-04-29 DIAGNOSIS — K219 Gastro-esophageal reflux disease without esophagitis: Secondary | ICD-10-CM

## 2023-05-01 ENCOUNTER — Encounter: Payer: Self-pay | Admitting: Family Medicine

## 2023-05-01 ENCOUNTER — Ambulatory Visit: Admitting: Family Medicine

## 2023-05-01 VITALS — BP 122/70 | HR 77 | Ht 79.0 in | Wt 311.0 lb

## 2023-05-01 DIAGNOSIS — M9908 Segmental and somatic dysfunction of rib cage: Secondary | ICD-10-CM | POA: Diagnosis not present

## 2023-05-01 DIAGNOSIS — M9903 Segmental and somatic dysfunction of lumbar region: Secondary | ICD-10-CM

## 2023-05-01 DIAGNOSIS — M9902 Segmental and somatic dysfunction of thoracic region: Secondary | ICD-10-CM | POA: Diagnosis not present

## 2023-05-01 DIAGNOSIS — M170 Bilateral primary osteoarthritis of knee: Secondary | ICD-10-CM | POA: Diagnosis not present

## 2023-05-01 DIAGNOSIS — M9904 Segmental and somatic dysfunction of sacral region: Secondary | ICD-10-CM

## 2023-05-01 DIAGNOSIS — M9901 Segmental and somatic dysfunction of cervical region: Secondary | ICD-10-CM

## 2023-05-01 DIAGNOSIS — M5416 Radiculopathy, lumbar region: Secondary | ICD-10-CM

## 2023-05-01 MED ORDER — SODIUM HYALURONATE 60 MG/3ML IX PRSY
120.0000 mg | PREFILLED_SYRINGE | Freq: Once | INTRA_ARTICULAR | Status: AC
  Administered 2023-05-01: 120 mg via INTRA_ARTICULAR

## 2023-05-01 NOTE — Patient Instructions (Signed)
 Durolane bilateral knee injection today.

## 2023-05-01 NOTE — Assessment & Plan Note (Signed)
 Chronic problem with worsening symptoms.  Will try viscosupplementation and see how patient responds.  Discussed icing regimen and home exercises, which activities to do and which ones to avoid.  Increase activity slowly otherwise.  Discussed icing regimen.  Follow-up again in 6 to 8 weeks otherwise.

## 2023-05-01 NOTE — Assessment & Plan Note (Signed)
 History of a lumbar radiculopathy.  Continues to have some discomfort and pain noted.  Exercises still.  Patient did respond well though to osteopathic ambulation.  Hopeful that we will continue to monitor.  Discussed which activities to do and which ones to avoid.  Increase activity slowly.  Follow-up again in 6 to 8 weeks

## 2023-05-02 ENCOUNTER — Other Ambulatory Visit: Payer: Self-pay | Admitting: Nurse Practitioner

## 2023-05-02 DIAGNOSIS — Z87898 Personal history of other specified conditions: Secondary | ICD-10-CM

## 2023-05-02 MED ORDER — SCOPOLAMINE 1 MG/3DAYS TD PT72: 1.0000 | MEDICATED_PATCH | TRANSDERMAL | 0 refills | Status: AC

## 2023-05-14 ENCOUNTER — Other Ambulatory Visit: Payer: Self-pay | Admitting: Family Medicine

## 2023-05-17 NOTE — Progress Notes (Unsigned)
 [facility] [address] [place] [address] Phone: [phone] Subjective:   Roger [name] am a scribe for Dr. Felipe Horton.   I'Roger seeing this patient by the request  of:  Bluford Burkitt, NP  CC: Bilateral knee pain  [id number]  Roger [name] is a 53 y.o. male coming in with complaint of back and neck pain.  Bilateral knee pain.  OMT on 05/01/2023. Patient states the back is better but it is still not 100%. Certain ways he moves it catches and starting to have numbness in left leg. Just here for OMT today.   Medications patient has been prescribed: gabapentin   Taking:yes         Reviewed prior external information including notes and imaging from previsou exam, outside providers and external EMR if available.   As well as notes that were available from care everywhere and other healthcare systems.  Past medical history, social, surgical and family history all reviewed in electronic medical record.  No pertanent information unless stated regarding to the chief complaint.   Past Medical History:  Diagnosis Date   Bradycardia    Chicken pox    COVID-19    01/07/20 with covid pneumonia [facility] s/p MAB infusion 01/13/20 [facility] ED   GERD (gastroesophageal reflux disease)    Hiatal hernia    small noted 2013    Hyperlipidemia    Kidney stone    Premature atrial contractions    Premature ventricular contraction    Vitamin D  deficiency    Wrist fracture    right     No Known Allergies   Review of Systems:  No headache, visual changes, nausea, vomiting, diarrhea, constipation, dizziness, abdominal pain, skin rash, fevers, chills, night sweats, weight loss, swollen lymph nodes, body aches, joint swelling, chest pain, shortness of breath, mood changes. POSITIVE muscle aches  Objective  There were no vitals taken for this visit.   General: No apparent distress alert and oriented x3 mood and affect normal, dressed appropriately.   HEENT: Pupils equal, extraocular movements intact  Respiratory: Patient's speak in full sentences and does not appear short of breath  Cardiovascular: No lower extremity edema, non tender, no erythema  Gait MSK:  Back does have some loss of lordosis noted.  Some tenderness to palpation in the paraspinal musculature.  Tightness with Veldon German right greater than left.  Unfortunately patient does have a straight leg test positive on the left side.  This is done at 25 degrees of flexion.  Osteopathic findings  C2 flexed rotated and side bent right C6 flexed rotated and side bent left T3 extended rotated and side bent right inhaled rib T9 extended rotated and side bent left L3 flexed rotated and side bent left L4 flexed rotated and side bent left Sacrum right on right     Assessment and Plan:  No problem-specific Assessment & Plan notes found for this encounter.    Nonallopathic problems  Decision today to treat with OMT was based on Physical Exam  After verbal consent patient was treated with  ME, FPR techniques in cervical, rib, thoracic, lumbar, and sacral  areas  Patient tolerated the procedure well with improvement in symptoms  Patient given exercises, stretches and lifestyle modifications  See medications in patient instructions if given  Patient will follow up in 4-8 weeks     The above documentation has been reviewed and is accurate and complete Roger [name] Roger Ona Rathert, DO  Note: This dictation was prepared with Dragon dictation along with smaller phrase technology. Any transcriptional errors that result from this process are unintentional.

## 2023-05-18 ENCOUNTER — Ambulatory Visit: Payer: Commercial Managed Care - PPO | Admitting: Family Medicine

## 2023-05-18 VITALS — BP 130/70 | HR 69 | Ht 79.0 in

## 2023-05-18 DIAGNOSIS — M5416 Radiculopathy, lumbar region: Secondary | ICD-10-CM

## 2023-05-18 DIAGNOSIS — M9908 Segmental and somatic dysfunction of rib cage: Secondary | ICD-10-CM | POA: Diagnosis not present

## 2023-05-18 DIAGNOSIS — M545 Low back pain, unspecified: Secondary | ICD-10-CM

## 2023-05-18 DIAGNOSIS — M9903 Segmental and somatic dysfunction of lumbar region: Secondary | ICD-10-CM

## 2023-05-18 DIAGNOSIS — M9901 Segmental and somatic dysfunction of cervical region: Secondary | ICD-10-CM

## 2023-05-18 DIAGNOSIS — M9902 Segmental and somatic dysfunction of thoracic region: Secondary | ICD-10-CM | POA: Diagnosis not present

## 2023-05-18 DIAGNOSIS — G8929 Other chronic pain: Secondary | ICD-10-CM

## 2023-05-18 DIAGNOSIS — M9904 Segmental and somatic dysfunction of sacral region: Secondary | ICD-10-CM

## 2023-05-18 NOTE — Patient Instructions (Signed)
 Good to see you. Mri lumbar schedule it when you feel like it is necessary. Return in 2 months.

## 2023-05-19 ENCOUNTER — Encounter: Payer: Self-pay | Admitting: Family Medicine

## 2023-05-19 NOTE — Assessment & Plan Note (Signed)
 Patient has had difficulty with this and has made some improvement but still has 3-5 strength with dorsi flexion and a positive straight leg test on the left side.  Attempted osteopathic manipulation but had to be careful with secondary to the radicular symptoms today.  Discussed with patient about the possibility of an MRI with patient already failing formal physical therapy, home exercises, gabapentin  and prednisone.  Patient will get this scheduled and depending on findings could be a candidate for potential injections.  Follow-up with me again after imaging to discuss further

## 2023-06-06 ENCOUNTER — Encounter: Payer: Self-pay | Admitting: Family Medicine

## 2023-06-07 ENCOUNTER — Encounter: Payer: Self-pay | Admitting: Family Medicine

## 2023-06-28 ENCOUNTER — Ambulatory Visit
Admission: RE | Admit: 2023-06-28 | Discharge: 2023-06-28 | Disposition: A | Discharge status: Home / Self Care | Source: Ambulatory Visit | Attending: Family Medicine | Admitting: Family Medicine

## 2023-06-28 DIAGNOSIS — M5416 Radiculopathy, lumbar region: Secondary | ICD-10-CM

## 2023-06-28 DIAGNOSIS — M545 Low back pain, unspecified: Secondary | ICD-10-CM

## 2023-07-05 ENCOUNTER — Ambulatory Visit: Payer: Self-pay | Admitting: Family Medicine

## 2023-07-05 ENCOUNTER — Other Ambulatory Visit: Payer: Self-pay

## 2023-07-05 DIAGNOSIS — M5416 Radiculopathy, lumbar region: Secondary | ICD-10-CM

## 2023-07-16 ENCOUNTER — Encounter: Payer: Self-pay | Admitting: Family Medicine

## 2023-08-02 NOTE — Progress Notes (Signed)
 [facility] [address] [place] [address] Phone: [phone] Subjective:   Roger [name] am a scribe for Dr. Claudene.   I'm seeing this patient by the request  of:  Gretel App, NP  CC: Neck pain follow-up  [id number]  Roger [name] is a 53 y.o. male coming in with complaint of back and neck pain. OMT on 05/18/2023.  Found to have severe arthritic changes in the neck.  Had been doing relatively well though with conservative therapy and was started having increasing in low back pain.  Low back pain did show unfortunately moderate to severe spinal stenosis at L4-L5 and has been awaiting to see neurosurgery.  Patient states it is better but the lower back is hurting. Every once in a while it will catch and he almost has to fall to his knees. Wants to try the shots first before talking about surgery. Patient would like a refill on his gabapentin .  Medications patient has been prescribed: Gabapentin   Taking: yes         Reviewed prior external information including notes and imaging from previsou exam, outside providers and external EMR if available.   As well as notes that were available from care everywhere and other healthcare systems.  Past medical history, social, surgical and family history all reviewed in electronic medical record.  No pertanent information unless stated regarding to the chief complaint.   Past Medical History:  Diagnosis Date   Bradycardia    Chicken pox    COVID-19    01/07/20 with covid pneumonia [facility] s/p MAB infusion 01/13/20 [facility] ED   GERD (gastroesophageal reflux disease)    Hiatal hernia    small noted 2013    Hyperlipidemia    Kidney stone    Premature atrial contractions    Premature ventricular contraction    Vitamin D  deficiency    Wrist fracture    right     No Known Allergies   Review of Systems:  No headache, visual changes, nausea, vomiting, diarrhea, constipation,  dizziness, abdominal pain, skin rash, fevers, chills, night sweats, weight loss, swollen lymph nodes, body aches, joint swelling, chest pain, shortness of breath, mood changes. POSITIVE muscle aches  Objective  Blood pressure 112/70, pulse 82, height 6' 7 (2.007 m), weight (!) 306 lb 6.4 oz (139 kg), SpO2 98%.   General: No apparent distress alert and oriented x3 mood and affect normal, dressed appropriately.  HEENT: Pupils equal, extraocular movements intact  Respiratory: Patient's speak in full sentences and does not appear short of breath  Cardiovascular: No lower extremity edema, non tender, no erythema  Gait relatively normal MSK:  Back does have some loss lordosis.  Some tightness noted negative straight leg test but continues tightness in the gait with straight leg test.  Osteopathic findings  C2 flexed rotated and side bent right C7 flexed rotated and side bent left T3 extended rotated and side bent right inhaled rib T9 extended rotated and side bent left L1 flexed rotated and side bent right L3 flexed rotated and side bent left Sacrum right on right     Assessment and Plan:  Lumbar radiculopathy Patient does have continued the radicular symptoms intermittently but is improving.  Has responded well to conservative therapy previously.  Patient though does have severe spinal stenosis on the MRI that is concerning and we discussed the epidural which will be ordered today.  Avoided HVLA at the moment.  Did do some muscle energy.  Hopeful that this will help make some improvement.  Continue the gabapentin  anywhere between 100 to 300 mg.  Follow-up with me 6 weeks after the injection    Nonallopathic problems  Decision today to treat with OMT was based on Physical Exam  After verbal consent patient was treated with HVLA, ME, FPR techniques in cervical, rib, thoracic, lumbar, and sacral  areas  Patient tolerated the procedure well with improvement in symptoms  Patient given  exercises, stretches and lifestyle modifications  See medications in patient instructions if given  Patient will follow up in 4-8 weeks     The above documentation has been reviewed and is accurate and complete Tyger Wichman M Vasilios [place], DO         Note: This dictation was prepared with Dragon dictation along with smaller phrase technology. Any transcriptional errors that result from this process are unintentional.

## 2023-08-07 ENCOUNTER — Ambulatory Visit: Admitting: Family Medicine

## 2023-08-07 ENCOUNTER — Encounter: Payer: Self-pay | Admitting: Family Medicine

## 2023-08-07 VITALS — BP 112/70 | HR 82 | Ht 79.0 in | Wt 306.4 lb

## 2023-08-07 DIAGNOSIS — M9902 Segmental and somatic dysfunction of thoracic region: Secondary | ICD-10-CM

## 2023-08-07 DIAGNOSIS — M9904 Segmental and somatic dysfunction of sacral region: Secondary | ICD-10-CM | POA: Diagnosis not present

## 2023-08-07 DIAGNOSIS — M5416 Radiculopathy, lumbar region: Secondary | ICD-10-CM

## 2023-08-07 DIAGNOSIS — M9903 Segmental and somatic dysfunction of lumbar region: Secondary | ICD-10-CM | POA: Diagnosis not present

## 2023-08-07 MED ORDER — PREDNISONE 20 MG PO TABS: 20.0000 mg | ORAL_TABLET | Freq: Every day | ORAL | 0 refills | Status: AC

## 2023-08-07 MED ORDER — METHYLPREDNISOLONE ACETATE 80 MG/ML IJ SUSP
80.0000 mg | Freq: Once | INTRAMUSCULAR | Status: AC
Start: 2023-08-07 — End: 2023-08-07
  Administered 2023-08-07: 80 mg via INTRAMUSCULAR

## 2023-08-07 MED ORDER — KETOROLAC TROMETHAMINE 60 MG/2ML IM SOLN
60.0000 mg | Freq: Once | INTRAMUSCULAR | Status: AC
  Administered 2023-08-07: 60 mg via INTRAMUSCULAR

## 2023-08-07 NOTE — Assessment & Plan Note (Addendum)
 Patient does have continued the radicular symptoms intermittently but is improving.  Has responded well to conservative therapy previously.  Patient though does have severe spinal stenosis on the MRI that is concerning and we discussed the epidural which will be ordered today.  Avoided HVLA at the moment.  Did do some muscle energy.  Hopeful that this will help make some improvement.  Continue the gabapentin  anywhere between 100 to 300 mg.  Follow-up with me 6 weeks after the injection Toradol  and Depo-Medrol  injection was given today as well.

## 2023-08-07 NOTE — Patient Instructions (Addendum)
 Good to see you. Gabapentin  refilled.  Prednisone  for 5 days See me again in 2 months [facility] [phone]

## 2023-09-12 ENCOUNTER — Other Ambulatory Visit

## 2023-09-13 NOTE — Discharge Instructions (Signed)

## 2023-09-14 ENCOUNTER — Ambulatory Visit
Admission: RE | Admit: 2023-09-14 | Discharge: 2023-09-14 | Disposition: A | Discharge status: Home / Self Care | Source: Ambulatory Visit | Attending: Family Medicine | Admitting: Family Medicine

## 2023-09-14 DIAGNOSIS — M5416 Radiculopathy, lumbar region: Secondary | ICD-10-CM

## 2023-09-14 MED ORDER — METHYLPREDNISOLONE ACETATE 40 MG/ML INJ SUSP (RADIOLOG
80.0000 mg | Freq: Once | INTRAMUSCULAR | Status: AC
  Administered 2023-09-14: 80 mg via EPIDURAL

## 2023-09-14 MED ORDER — IOPAMIDOL (ISOVUE-M 200) INJECTION 41%
1.0000 mL | Freq: Once | INTRAMUSCULAR | Status: AC
  Administered 2023-09-14: 1 mL via EPIDURAL

## 2023-09-28 ENCOUNTER — Ambulatory Visit (INDEPENDENT_AMBULATORY_CARE_PROVIDER_SITE_OTHER): Admitting: Family Medicine

## 2023-09-28 ENCOUNTER — Encounter: Payer: Self-pay | Admitting: Family Medicine

## 2023-09-28 VITALS — BP 130/70 | HR 66 | Ht 79.0 in | Wt 312.6 lb

## 2023-09-28 DIAGNOSIS — M9901 Segmental and somatic dysfunction of cervical region: Secondary | ICD-10-CM | POA: Diagnosis not present

## 2023-09-28 DIAGNOSIS — M9903 Segmental and somatic dysfunction of lumbar region: Secondary | ICD-10-CM | POA: Diagnosis not present

## 2023-09-28 DIAGNOSIS — M5416 Radiculopathy, lumbar region: Secondary | ICD-10-CM

## 2023-09-28 DIAGNOSIS — M9908 Segmental and somatic dysfunction of rib cage: Secondary | ICD-10-CM

## 2023-09-28 DIAGNOSIS — M9902 Segmental and somatic dysfunction of thoracic region: Secondary | ICD-10-CM

## 2023-09-28 DIAGNOSIS — M9904 Segmental and somatic dysfunction of sacral region: Secondary | ICD-10-CM | POA: Diagnosis not present

## 2023-09-28 MED ORDER — GABAPENTIN 100 MG PO CAPS: 200.0000 mg | ORAL_CAPSULE | Freq: Every day | ORAL | 0 refills | Status: DC

## 2023-09-28 NOTE — Assessment & Plan Note (Signed)
 Continues to have some mild lumbar radiculopathy but also has signs and symptoms more consistent with the severe spinal stenosis noted on MRI.  Patient still wants to avoid any type of surgical intervention.  Discussed with patient about icing regimen and home exercises, discussed with patient about another potential epidural in her back.  Patient would like to try this.  Follow-up with me again with me in 8 weeks otherwise.

## 2023-09-28 NOTE — Progress Notes (Signed)
 [name] [address] [place] [address] Phone: [phone] Subjective:   Roger [name] am a scribe for Dr. Claudene.  I'm seeing this patient by the request  of:  Gretel App, NP  CC: back and neck pain follow up   YEP:Dlagzrupcz  RASHEED WELTY is a 53 y.o. male coming in with complaint of back and neck pain. OMT 08/09/2023. Patient states the back and neck has been better. Had the epidural about two weeks ago and he is still waiting for the medication to kick in. The pain is still there not as bad as before but still there.    Medications patient has been prescribed: Prednisone   Taking: No         Reviewed prior external information including notes and imaging from previsou exam, outside providers and external EMR if available.   As well as notes that were available from care everywhere and other healthcare systems.  Past medical history, social, surgical and family history all reviewed in electronic medical record.  No pertanent information unless stated regarding to the chief complaint.   Past Medical History:  Diagnosis Date   Bradycardia    Chicken pox    COVID-19    01/07/20 with covid pneumonia [facility] s/p MAB infusion 01/13/20 [facility] ED   GERD (gastroesophageal reflux disease)    Hiatal hernia    small noted 2013    Hyperlipidemia    Kidney stone    Premature atrial contractions    Premature ventricular contraction    Vitamin D  deficiency    Wrist fracture    right     No Known Allergies   Review of Systems:  No headache, visual changes, nausea, vomiting, diarrhea, constipation, dizziness, abdominal pain, skin rash, fevers, chills, night sweats, weight loss, swollen lymph nodes, body aches, joint swelling, chest pain, shortness of breath, mood changes. POSITIVE muscle aches  Objective  There were no vitals taken for this visit.   General: No apparent distress alert and oriented x3 mood and affect  normal, dressed appropriately.  HEENT: Pupils equal, extraocular movements intact  Respiratory: Patient's speak in full sentences and does not appear short of breath  Cardiovascular: No lower extremity edema, non tender, no erythema  Gait MSK:  Back significant loss of lordosis noted.  Only 5 degrees of extension of the lower back.  Patient does have tenderness of the neck noted as well.  Seems to be right greater than left.  Osteopathic findings  C2 flexed rotated and side bent right C6 flexed rotated and side bent left C7 flexed rotated and side bent left T3 extended rotated and side bent right inhaled rib T9 extended rotated and side bent left L2 flexed rotated and side bent right L4 flexed rotated and side bent left Sacrum right on right     Assessment and Plan:  No problem-specific Assessment & Plan notes found for this encounter.    Nonallopathic problems  Decision today to treat with OMT was based on Physical Exam  After verbal consent patient was treated with  ME, FPR techniques in cervical, rib, thoracic, lumbar, and sacral  areas  Patient tolerated the procedure well with improvement in symptoms  Patient given exercises, stretches and lifestyle modifications  See medications in patient instructions if given  Patient will follow up in 4-8 weeks     The above documentation has been reviewed and is accurate and complete Harmonie Verrastro M Myron Lona, DO  Note: This dictation was prepared with Dragon dictation along with smaller phrase technology. Any transcriptional errors that result from this process are unintentional.

## 2023-09-28 NOTE — Patient Instructions (Addendum)
 Good to see you. Repeat Epidural L4 - L5 in about 6 weeks. See me again in about 6 to 8 weeks.

## 2023-10-10 ENCOUNTER — Ambulatory Visit: Admitting: Family Medicine

## 2023-11-08 ENCOUNTER — Ambulatory Visit: Admitting: Family Medicine

## 2023-12-11 NOTE — Progress Notes (Unsigned)
 [facility] [address] [place] [address] Phone: [phone] Subjective:   Roger [name], am serving as a scribe for Dr. Arthea Claudene.  I'm seeing this patient by the request  of:  Gretel App, NP  CC: Back and neck pain follow-up  YEP:Roger [name]  Roger [name] is a 53 y.o. male coming in with complaint of back and neck pain. OMT on 09/28/2023. Patient states doing better. Epidural helped. Thinks we can put off surgery for a bit. Doing well overall. Still take the gabapentin  at night  Medications patient has been prescribed: Gabapentin   Taking:         Reviewed prior external information including notes and imaging from previsou exam, outside providers and external EMR if available.   As well as notes that were available from care everywhere and other healthcare systems.  Past medical history, social, surgical and family history all reviewed in electronic medical record.  No pertanent information unless stated regarding to the chief complaint.   Past Medical History:  Diagnosis Date   Bradycardia    Chicken pox    COVID-19    01/07/20 with covid pneumonia [facility] s/p MAB infusion 01/13/20 [facility]   GERD (gastroesophageal reflux disease)    Hiatal hernia    small noted 2013    Hyperlipidemia    Kidney stone    Premature atrial contractions    Premature ventricular contraction    Vitamin D  deficiency    Wrist fracture    right     No Known Allergies   Review of Systems:  No headache, visual changes, nausea, vomiting, diarrhea, constipation, dizziness, abdominal pain, skin rash, fevers, chills, night sweats, weight loss, swollen lymph nodes, body aches, joint swelling, chest pain, shortness of breath, mood changes. POSITIVE muscle aches  Objective  Blood pressure 130/66, pulse 85, height 6' 7 (2.007 m), weight (!) 316 lb (143.3 kg), SpO2 97%.   General: No apparent distress alert and oriented x3 mood and affect  normal, dressed appropriately.  HEENT: Pupils equal, extraocular movements intact  Respiratory: Patient's speak in full sentences and does not appear short of breath  Cardiovascular: No lower extremity edema, non tender, no erythema  Gait MSK:  Back does have some loss of lordosis noted.  Some tenderness to palpation in the paraspinal musculature.  Patient does have some tightness noted seems to be with sidebending bilaterally.  Osteopathic findings  C3 flexed rotated and side bent right C6 flexed rotated and side bent left T3 extended rotated and side bent right inhaled rib T9 extended rotated and side bent left L2 flexed rotated and side bent right L3 flexed rotated and side bent left Sacrum right on right     Assessment and Plan:  Degenerative disc disease, cervical Degenerative disc disease noted.  Discussed icing regimen and home exercises.  Increase activity slowly.  Follow-up again in 6 to 12 weeks.  Lumbar radiculopathy Has not used the second epidural that was ordered at this time.  Patient still has tightness noted but did discuss with patient about icing regimen and home exercises.  Increase activity slowly.  Follow-up again in 6 to 8 weeks otherwise.    Nonallopathic problems  Decision today to treat with OMT was based on Physical Exam  After verbal consent patient was treated with HVLA, ME, FPR techniques in cervical, rib, thoracic, lumbar, and sacral  areas  Patient tolerated the procedure well with improvement in symptoms  Patient given exercises, stretches  and lifestyle modifications  See medications in patient instructions if given  Patient will follow up in 4-8 weeks    The above documentation has been reviewed and is accurate and complete Lillyian Heidt M Kenasia Scheller, DO          Note: This dictation was prepared with Dragon dictation along with smaller phrase technology. Any transcriptional errors that result from this process are unintentional.

## 2023-12-12 ENCOUNTER — Ambulatory Visit: Admitting: Family Medicine

## 2023-12-12 ENCOUNTER — Encounter: Payer: Self-pay | Admitting: Family Medicine

## 2023-12-12 VITALS — BP 130/66 | HR 85 | Ht 79.0 in | Wt 316.0 lb

## 2023-12-12 DIAGNOSIS — M9908 Segmental and somatic dysfunction of rib cage: Secondary | ICD-10-CM

## 2023-12-12 DIAGNOSIS — M5416 Radiculopathy, lumbar region: Secondary | ICD-10-CM | POA: Diagnosis not present

## 2023-12-12 DIAGNOSIS — M9903 Segmental and somatic dysfunction of lumbar region: Secondary | ICD-10-CM | POA: Diagnosis not present

## 2023-12-12 DIAGNOSIS — M9901 Segmental and somatic dysfunction of cervical region: Secondary | ICD-10-CM | POA: Diagnosis not present

## 2023-12-12 DIAGNOSIS — M9902 Segmental and somatic dysfunction of thoracic region: Secondary | ICD-10-CM | POA: Diagnosis not present

## 2023-12-12 DIAGNOSIS — M5030 Other cervical disc degeneration, unspecified cervical region: Secondary | ICD-10-CM | POA: Diagnosis not present

## 2023-12-12 DIAGNOSIS — M9904 Segmental and somatic dysfunction of sacral region: Secondary | ICD-10-CM

## 2023-12-12 NOTE — Assessment & Plan Note (Signed)
 Has not used the second epidural that was ordered at this time.  Patient still has tightness noted but did discuss with patient about icing regimen and home exercises.  Increase activity slowly.  Follow-up again in 6 to 8 weeks otherwise.

## 2023-12-12 NOTE — Patient Instructions (Signed)
 Good to see you! Good luck with the land Have a happy Solo Holiday See you again in 2 months

## 2023-12-12 NOTE — Assessment & Plan Note (Signed)
 Degenerative disc disease noted.  Discussed icing regimen and home exercises.  Increase activity slowly.  Follow-up again in 6 to 12 weeks.

## 2023-12-27 ENCOUNTER — Encounter: Payer: Self-pay | Admitting: Dermatology

## 2023-12-27 ENCOUNTER — Ambulatory Visit: Admitting: Dermatology

## 2023-12-27 DIAGNOSIS — Z1283 Encounter for screening for malignant neoplasm of skin: Secondary | ICD-10-CM

## 2023-12-27 DIAGNOSIS — D229 Melanocytic nevi, unspecified: Secondary | ICD-10-CM

## 2023-12-27 DIAGNOSIS — L821 Other seborrheic keratosis: Secondary | ICD-10-CM

## 2023-12-27 DIAGNOSIS — L814 Other melanin hyperpigmentation: Secondary | ICD-10-CM

## 2023-12-27 DIAGNOSIS — L578 Other skin changes due to chronic exposure to nonionizing radiation: Secondary | ICD-10-CM

## 2023-12-27 DIAGNOSIS — L820 Inflamed seborrheic keratosis: Secondary | ICD-10-CM

## 2023-12-27 DIAGNOSIS — D1801 Hemangioma of skin and subcutaneous tissue: Secondary | ICD-10-CM

## 2023-12-27 DIAGNOSIS — L573 Poikiloderma of Civatte: Secondary | ICD-10-CM

## 2023-12-27 NOTE — Progress Notes (Unsigned)
 Follow-Up Visit   Subjective  Roger [name] is a 52 y.o. male who presents for the following: Skin Cancer Screening and Full Body Skin Exam. Hx of AKs. No personal Hx of skin cancer or dysplastic nevi.   The patient presents for Total-Body Skin Exam (TBSE) for skin cancer screening and mole check. The patient has spots, moles and lesions to be evaluated, some may be new or changing and the patient may have concern these could be cancer.    The following portions of the chart were reviewed this encounter and updated as appropriate: medications, allergies, medical history  Review of Systems:  No other skin or systemic complaints except as noted in HPI or Assessment and Plan.  Objective  Well appearing patient in no apparent distress; mood and affect are within normal limits.  A full examination was performed including scalp, head, eyes, ears, nose, lips, neck, chest, axillae, abdomen, back, buttocks, bilateral upper extremities, bilateral lower extremities, hands, feet, fingers, toes, fingernails, and toenails. All findings within normal limits unless otherwise noted below.   Relevant physical exam findings are noted in the Assessment and Plan.       L lateral Eyebrow x1, L upper eyelid x3, back x1, L medial canthus at upper eyelid margin x1 (6) Erythematous keratotic or waxy stuck-on papule or plaque. Neck Mottled discoloration  Assessment & Plan   SKIN CANCER SCREENING PERFORMED TODAY.  ACTINIC DAMAGE - Chronic condition, secondary to cumulative UV/sun exposure - diffuse scaly erythematous macules with underlying dyspigmentation - Recommend daily broad spectrum sunscreen SPF 30+ to sun-exposed areas, reapply every 2 hours as needed.  - Staying in the shade or wearing long sleeves, sun glasses (UVA+UVB protection) and wide brim hats (4-inch brim around the entire circumference of the hat) are also recommended for sun protection.  - Call for new or changing  lesions.  LENTIGINES, SEBORRHEIC KERATOSES, HEMANGIOMAS - Benign normal skin lesions - Benign-appearing - Call for any changes  MELANOCYTIC NEVI - Tan-brown and/or pink-flesh-colored symmetric macules and papules - Benign appearing on exam today - Observation - Call clinic for new or changing moles - Recommend daily use of broad spectrum spf 30+ sunscreen to sun-exposed areas.    HEMANGIOMA vs [place] vs Blue Nevus Exam: 3 mm blue-gray subcutaneous papule that blanches with compression without features suspicious for malignancy on dermoscopy  Discussed benign nature. Recommend observation. Call for changes. Photo taken today. Recheck at next visit.     INFLAMED SEBORRHEIC KERATOSIS (6) L lateral Eyebrow x1, L upper eyelid x3, back x1, L medial canthus at upper eyelid margin x1 (6) Symptomatic, irritating, patient would like treated. Destruction of lesion - L lateral Eyebrow x1, L upper eyelid x3, back x1, L medial canthus at upper eyelid margin x1 (6) Complexity: simple   Destruction method: cryotherapy   Informed consent: discussed and consent obtained   Timeout:  patient name, date of birth, surgical site, and procedure verified Lesion destroyed using liquid nitrogen: Yes   Region frozen until ice ball extended beyond lesion: Yes   Outcome: patient tolerated procedure well with no complications   Post-procedure details: wound care instructions given   Additional details:  Prior to procedure, discussed risks of blister formation, small wound, skin dyspigmentation, or rare scar following cryotherapy. Recommend Vaseline ointment to treated areas while healing.   POIKILODERMA OF CIVATTE Neck Recommend daily broad spectrum sunscreen SPF 30+ to sun-exposed areas, reapply every 2 hours as needed. Call for new or changing lesions.  Staying  in the shade or wearing long sleeves, sun glasses (UVA+UVB protection) and wide brim hats (4-inch brim around the entire circumference of the  hat) are also recommended for sun protection.   Return in about 1 year (around 12/26/2024) for TBSE, HxAKs.  I, Rechel Delosreyes, CMA, am acting as scribe for Alm Rhyme, MD.   Documentation: I have reviewed the above documentation for accuracy and completeness, and I agree with the above.  Alm Rhyme, MD

## 2023-12-27 NOTE — Patient Instructions (Addendum)

## 2023-12-28 ENCOUNTER — Other Ambulatory Visit: Payer: Self-pay | Admitting: Family Medicine

## 2024-01-02 ENCOUNTER — Encounter: Payer: Self-pay | Admitting: Dermatology

## 2024-01-03 ENCOUNTER — Ambulatory Visit: Payer: Commercial Managed Care - PPO | Admitting: Dermatology

## 2024-01-06 ENCOUNTER — Encounter: Payer: Self-pay | Admitting: Family Medicine

## 2024-01-08 ENCOUNTER — Other Ambulatory Visit: Payer: Self-pay

## 2024-01-08 MED ORDER — GABAPENTIN 100 MG PO CAPS: 200.0000 mg | ORAL_CAPSULE | Freq: Every day | ORAL | 0 refills | Status: AC

## 2024-01-26 ENCOUNTER — Other Ambulatory Visit: Payer: Self-pay | Admitting: Nurse Practitioner

## 2024-01-26 DIAGNOSIS — N529 Male erectile dysfunction, unspecified: Secondary | ICD-10-CM

## 2024-01-26 NOTE — Telephone Encounter (Signed)
 Copied from CRM #[id number]. Topic: Clinical - Medication Refill >> Jan 26, 2024  8:11 AM Ivette P wrote: Medication: tadalafil  (CIALIS ) 5 MG tablet   Has the patient contacted their pharmacy? Yes (Agent: If no, request that the patient contact the pharmacy for the refill. If patient does not wish to contact the pharmacy document the reason why and proceed with request.) (Agent: If yes, when and what did the pharmacy advise?)  This is the patient's preferred pharmacy:  [facility] [address] [place] - [address] [place] [address] Phone: [phone] Fax: [phone]  Is this the correct pharmacy for this prescription? Yes If no, delete pharmacy and type the correct one.   Has the prescription been filled recently? No  Is the patient out of the medication? Yes  Has the patient been seen for an appointment in the last year OR does the patient have an upcoming appointment? Yes, 04/08/2024  Can we respond through MyChart? Yes  Agent: Please be advised that Rx refills may take up to 3 business days. We ask that you follow-up with your pharmacy.

## 2024-01-28 ENCOUNTER — Other Ambulatory Visit: Payer: Self-pay | Admitting: Nurse Practitioner

## 2024-01-28 DIAGNOSIS — K219 Gastro-esophageal reflux disease without esophagitis: Secondary | ICD-10-CM

## 2024-02-14 ENCOUNTER — Ambulatory Visit: Admitting: Family Medicine

## 2024-02-16 NOTE — Progress Notes (Signed)
 " [name] [address] [place] [address] Phone: [phone] Subjective:   [name], am serving as a scribe for Dr. Arthea Claudene.  I'm seeing this patient by the request  of:  Gretel App, NP  CC: Back and neck pain follow-up  YEP:Roger [name]  Roger [name] is a 54 y.o. male coming in with complaint of back and neck pain. OMT 12/02/2023. Patient states same per usual. No new symptoms.  Continues to have some lower back and neck tightness.  Neck tightness seems to be more on the left side.  Medications patient has been prescribed: Gabapentin   Taking:         Reviewed prior external information including notes and imaging from previsou exam, outside providers and external EMR if available.   As well as notes that were available from care everywhere and other healthcare systems.  Past medical history, social, surgical and family history all reviewed in electronic medical record.  No pertanent information unless stated regarding to the chief complaint.   Past Medical History:  Diagnosis Date   Bradycardia    Chicken pox    COVID-19    01/07/20 with covid pneumonia [facility] s/p MAB infusion 01/13/20 [facility] ED   GERD (gastroesophageal reflux disease)    Hiatal hernia    small noted 2013    Hyperlipidemia    Kidney stone    Premature atrial contractions    Premature ventricular contraction    Vitamin D  deficiency    Wrist fracture    right     Allergies[1]   Review of Systems:  No headache, visual changes, nausea, vomiting, diarrhea, constipation, dizziness, abdominal pain, skin rash, fevers, chills, night sweats, weight loss, swollen lymph nodes, body aches, joint swelling, chest pain, shortness of breath, mood changes. POSITIVE muscle aches  Objective  Blood pressure 124/86, height 6' 7 (2.007 m), weight (!) 307 lb (139.3 kg), SpO2 99%.   General: No apparent distress alert and oriented x3 mood and affect normal,  dressed appropriately.  HEENT: Pupils equal, extraocular movements intact  Respiratory: Patient's speak in full sentences and does not appear short of breath  Cardiovascular: No lower extremity edema, non tender, no erythema  Gait MSK:  Back does have some loss lordosis.  Neck exam does have some tenderness to palpation noted.  Positive tightness on the left side with straight leg test but no true radicular symptoms.  Limited sidebending of the neck  Osteopathic findings  C2 flexed rotated and side bent left C6 flexed rotated and side bent left T3 extended rotated and side bent right inhaled rib T9 extended rotated and side bent left L2 flexed rotated and side bent right Sacrum right on right       Assessment and Plan:  Degenerative disc disease, cervical Chronic, with mild exacerbation is likely secondary to more of the weather as well as patient being more active.  Patient has lost 9 pounds since previous visit.  Encouraged him to continue to work hard.  Goal weight of at least 275.  Would make a significant improvement in his health.  Follow-up again in 6 to 8 weeks.    Nonallopathic problems  Decision today to treat with OMT was based on Physical Exam  After verbal consent patient was treated with HVLA, ME, FPR techniques in cervical, rib, thoracic, lumbar, and sacral  areas  Patient tolerated the procedure well with improvement in symptoms  Patient given exercises, stretches and lifestyle modifications  See medications in patient instructions if given  Patient will follow up in 4-8 weeks    The above documentation has been reviewed and is accurate and complete Roger Kitamura M [name], DO          Note: This dictation was prepared with Dragon dictation along with smaller phrase technology. Any transcriptional errors that result from this process are unintentional.            [1] No Known Allergies  "

## 2024-02-21 ENCOUNTER — Encounter: Payer: Self-pay | Admitting: Family Medicine

## 2024-02-21 ENCOUNTER — Ambulatory Visit: Admitting: Family Medicine

## 2024-02-21 VITALS — BP 124/86 | Ht 79.0 in | Wt 307.0 lb

## 2024-02-21 DIAGNOSIS — M9908 Segmental and somatic dysfunction of rib cage: Secondary | ICD-10-CM | POA: Diagnosis not present

## 2024-02-21 DIAGNOSIS — M5030 Other cervical disc degeneration, unspecified cervical region: Secondary | ICD-10-CM

## 2024-02-21 DIAGNOSIS — M9904 Segmental and somatic dysfunction of sacral region: Secondary | ICD-10-CM | POA: Diagnosis not present

## 2024-02-21 DIAGNOSIS — M9901 Segmental and somatic dysfunction of cervical region: Secondary | ICD-10-CM | POA: Diagnosis not present

## 2024-02-21 DIAGNOSIS — M9902 Segmental and somatic dysfunction of thoracic region: Secondary | ICD-10-CM

## 2024-02-21 DIAGNOSIS — M9903 Segmental and somatic dysfunction of lumbar region: Secondary | ICD-10-CM | POA: Diagnosis not present

## 2024-02-21 NOTE — Assessment & Plan Note (Signed)
 Chronic, with mild exacerbation is likely secondary to more of the weather as well as patient being more active.  Patient has lost 9 pounds since previous visit.  Encouraged him to continue to work hard.  Goal weight of at least 275.  Would make a significant improvement in his health.  Follow-up again in 6 to 8 weeks.

## 2024-02-21 NOTE — Patient Instructions (Signed)
 Good to see you! Doing great Let's get down at least another 10 See you again in 2-3 months

## 2024-04-11 ENCOUNTER — Encounter: Admitting: Nurse Practitioner

## 2024-05-07 ENCOUNTER — Ambulatory Visit: Admitting: Family Medicine

## 2025-01-06 ENCOUNTER — Ambulatory Visit: Admitting: Dermatology
